# Patient Record
Sex: Female | Born: 1945 | Race: White | Hispanic: No | Marital: Married | Smoking: Never smoker
Health system: Southern US, Community
[De-identification: ages and names within clinical notes are randomized; demographics above are authoritative.]

## PROBLEM LIST (undated history)

## (undated) DIAGNOSIS — I10 Essential (primary) hypertension: Secondary | ICD-10-CM

## (undated) DIAGNOSIS — N63 Unspecified lump in unspecified breast: Secondary | ICD-10-CM

## (undated) DIAGNOSIS — C50919 Malignant neoplasm of unspecified site of unspecified female breast: Secondary | ICD-10-CM

## (undated) DIAGNOSIS — E119 Type 2 diabetes mellitus without complications: Secondary | ICD-10-CM

---

## 2005-05-17 ENCOUNTER — Ambulatory Visit: Payer: Self-pay | Admitting: Internal Medicine

## 2005-05-26 ENCOUNTER — Ambulatory Visit: Payer: Self-pay | Admitting: Internal Medicine

## 2005-07-15 ENCOUNTER — Ambulatory Visit: Payer: Self-pay | Admitting: General Surgery

## 2005-07-26 DIAGNOSIS — C50919 Malignant neoplasm of unspecified site of unspecified female breast: Secondary | ICD-10-CM

## 2005-07-26 HISTORY — PX: BREAST LUMPECTOMY: SHX2

## 2005-07-26 HISTORY — PX: BREAST BIOPSY: SHX20

## 2005-07-26 HISTORY — DX: Malignant neoplasm of unspecified site of unspecified female breast: C50.919

## 2005-08-26 ENCOUNTER — Ambulatory Visit: Payer: Self-pay | Admitting: General Surgery

## 2005-09-20 ENCOUNTER — Ambulatory Visit: Payer: Self-pay | Admitting: Internal Medicine

## 2005-09-27 ENCOUNTER — Ambulatory Visit: Payer: Self-pay | Admitting: Radiation Oncology

## 2005-10-24 ENCOUNTER — Ambulatory Visit: Payer: Self-pay | Admitting: Radiation Oncology

## 2005-11-05 ENCOUNTER — Encounter: Payer: Self-pay | Admitting: Nurse Practitioner

## 2005-11-23 ENCOUNTER — Encounter: Payer: Self-pay | Admitting: Nurse Practitioner

## 2005-11-23 ENCOUNTER — Ambulatory Visit: Payer: Self-pay | Admitting: Radiation Oncology

## 2005-12-24 ENCOUNTER — Ambulatory Visit: Payer: Self-pay | Admitting: Radiation Oncology

## 2005-12-24 ENCOUNTER — Encounter: Payer: Self-pay | Admitting: Nurse Practitioner

## 2006-01-23 ENCOUNTER — Ambulatory Visit: Payer: Self-pay | Admitting: Radiation Oncology

## 2006-04-15 ENCOUNTER — Ambulatory Visit: Payer: Self-pay | Admitting: Internal Medicine

## 2006-04-19 ENCOUNTER — Ambulatory Visit: Payer: Self-pay | Admitting: Internal Medicine

## 2006-04-25 ENCOUNTER — Ambulatory Visit: Payer: Self-pay | Admitting: Radiation Oncology

## 2006-08-16 ENCOUNTER — Ambulatory Visit: Payer: Self-pay | Admitting: Internal Medicine

## 2006-08-26 ENCOUNTER — Ambulatory Visit: Payer: Self-pay | Admitting: Internal Medicine

## 2006-11-24 ENCOUNTER — Ambulatory Visit: Payer: Self-pay | Admitting: Internal Medicine

## 2006-12-25 ENCOUNTER — Ambulatory Visit: Payer: Self-pay | Admitting: Internal Medicine

## 2006-12-28 ENCOUNTER — Ambulatory Visit: Payer: Self-pay | Admitting: Internal Medicine

## 2007-01-24 ENCOUNTER — Ambulatory Visit: Payer: Self-pay | Admitting: Internal Medicine

## 2007-04-26 ENCOUNTER — Ambulatory Visit: Payer: Self-pay | Admitting: Internal Medicine

## 2007-05-17 ENCOUNTER — Ambulatory Visit: Payer: Self-pay | Admitting: Internal Medicine

## 2007-05-27 ENCOUNTER — Ambulatory Visit: Payer: Self-pay | Admitting: Internal Medicine

## 2007-05-31 ENCOUNTER — Ambulatory Visit: Payer: Self-pay | Admitting: Internal Medicine

## 2007-08-27 ENCOUNTER — Ambulatory Visit: Payer: Self-pay | Admitting: Internal Medicine

## 2007-09-24 ENCOUNTER — Ambulatory Visit: Payer: Self-pay | Admitting: Internal Medicine

## 2007-10-02 ENCOUNTER — Ambulatory Visit: Payer: Self-pay | Admitting: Internal Medicine

## 2007-10-25 ENCOUNTER — Ambulatory Visit: Payer: Self-pay | Admitting: Internal Medicine

## 2008-01-24 ENCOUNTER — Ambulatory Visit: Payer: Self-pay | Admitting: Internal Medicine

## 2008-12-23 ENCOUNTER — Ambulatory Visit: Payer: Self-pay | Admitting: Internal Medicine

## 2008-12-26 ENCOUNTER — Ambulatory Visit: Payer: Self-pay | Admitting: Internal Medicine

## 2009-01-23 ENCOUNTER — Ambulatory Visit: Payer: Self-pay | Admitting: Internal Medicine

## 2009-02-21 ENCOUNTER — Ambulatory Visit: Payer: Self-pay | Admitting: Internal Medicine

## 2009-02-23 ENCOUNTER — Ambulatory Visit: Payer: Self-pay | Admitting: Internal Medicine

## 2009-09-23 ENCOUNTER — Ambulatory Visit: Payer: Self-pay | Admitting: Internal Medicine

## 2009-10-24 ENCOUNTER — Ambulatory Visit: Payer: Self-pay | Admitting: Internal Medicine

## 2009-12-24 ENCOUNTER — Ambulatory Visit: Payer: Self-pay | Admitting: Internal Medicine

## 2010-03-26 ENCOUNTER — Ambulatory Visit: Payer: Self-pay | Admitting: Internal Medicine

## 2010-04-14 ENCOUNTER — Ambulatory Visit: Payer: Self-pay | Admitting: Internal Medicine

## 2010-04-25 ENCOUNTER — Ambulatory Visit: Payer: Self-pay | Admitting: Internal Medicine

## 2010-07-23 IMAGING — MG MM CAD DIAGNOSTIC MAMMO
1 series · 8 of 8 positions shown · non-contrast
Comparison: none

REASON FOR EXAM: Hx Breast CA  Yearly
COMMENTS:

[R CC · right · 8 of 9 slices shown]
[im 1/9]
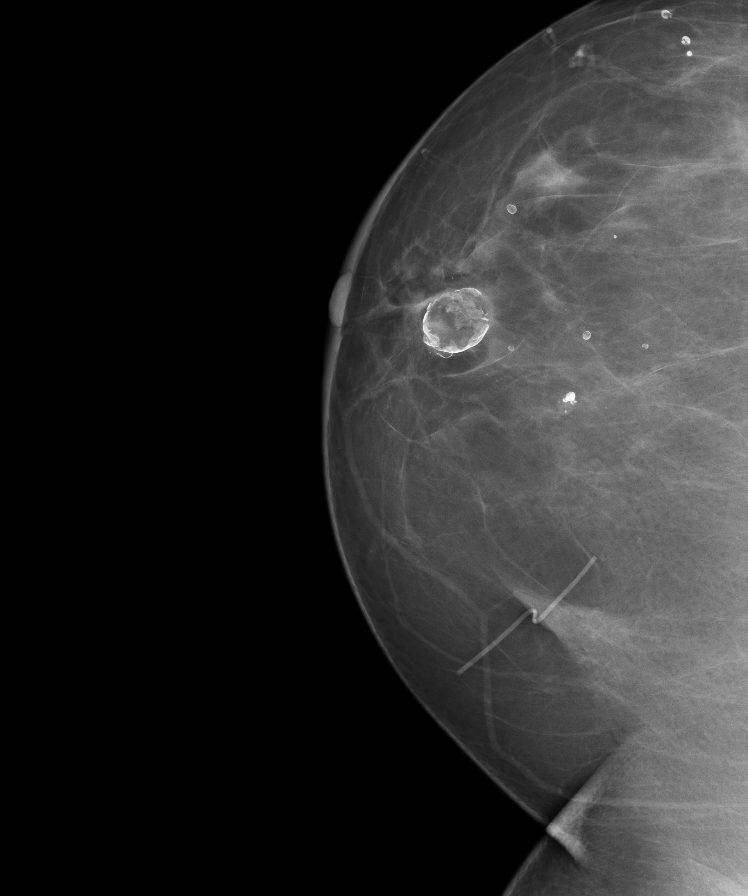
[im 2/9]
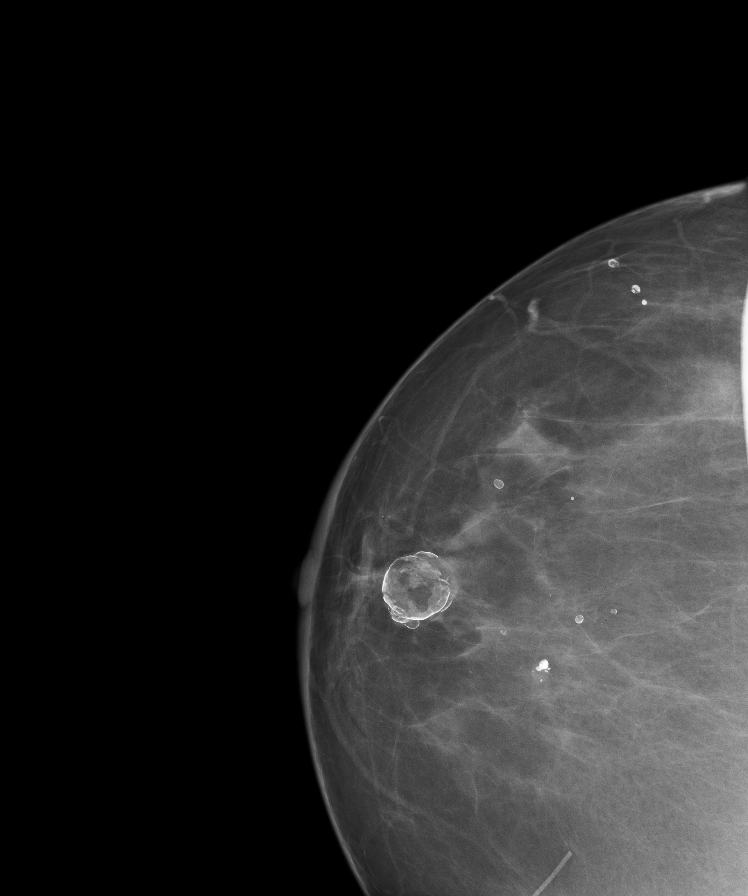
[im 3/9]
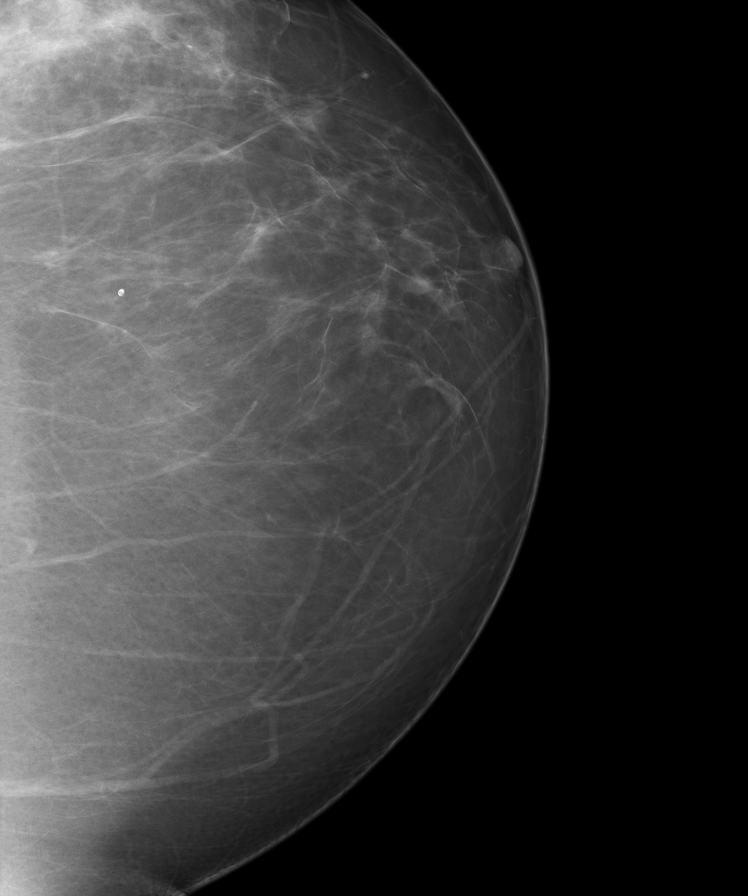
[im 4/9]
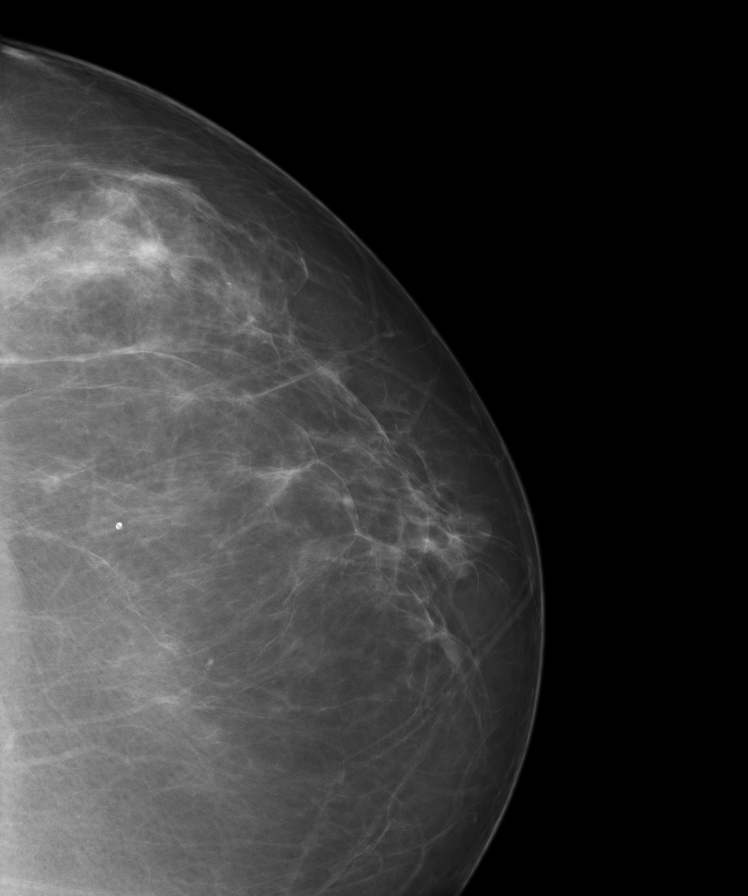
[im 5/9]
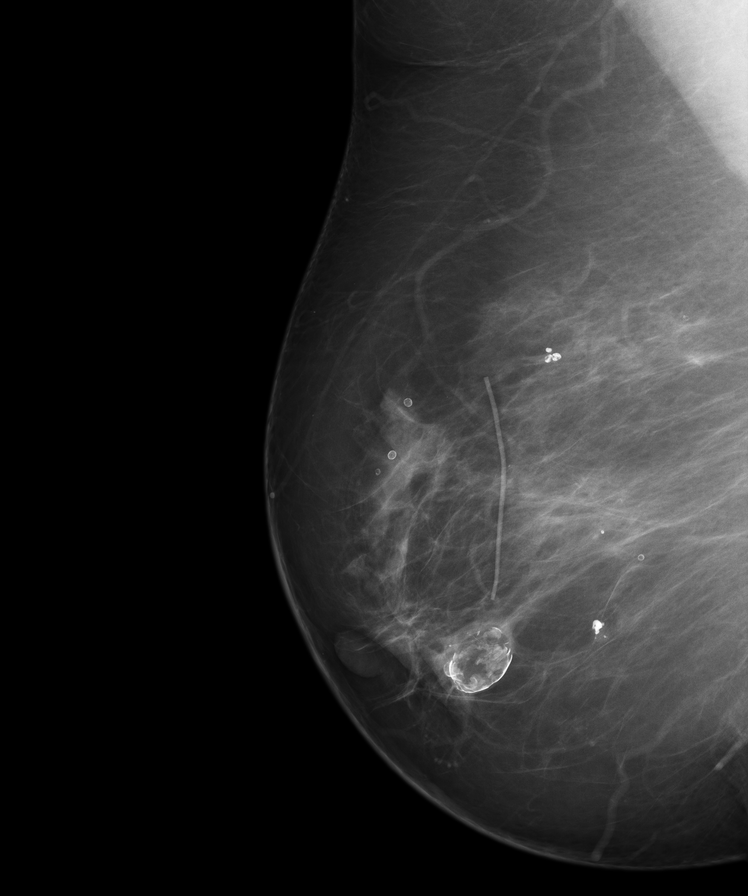
[im 6/9]
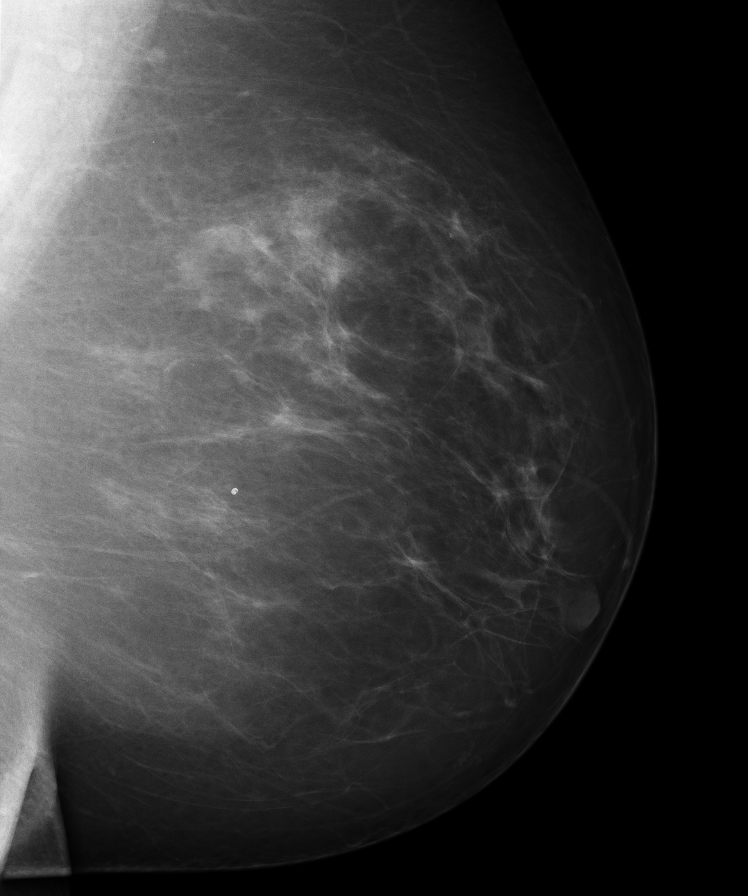
[im 7/9]
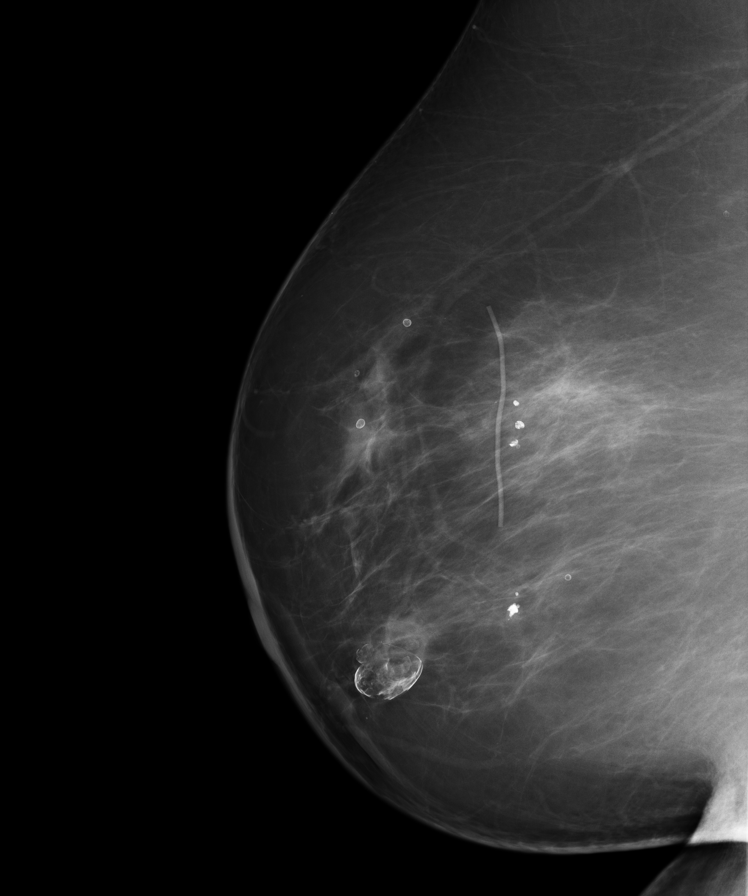
[im 9/9]
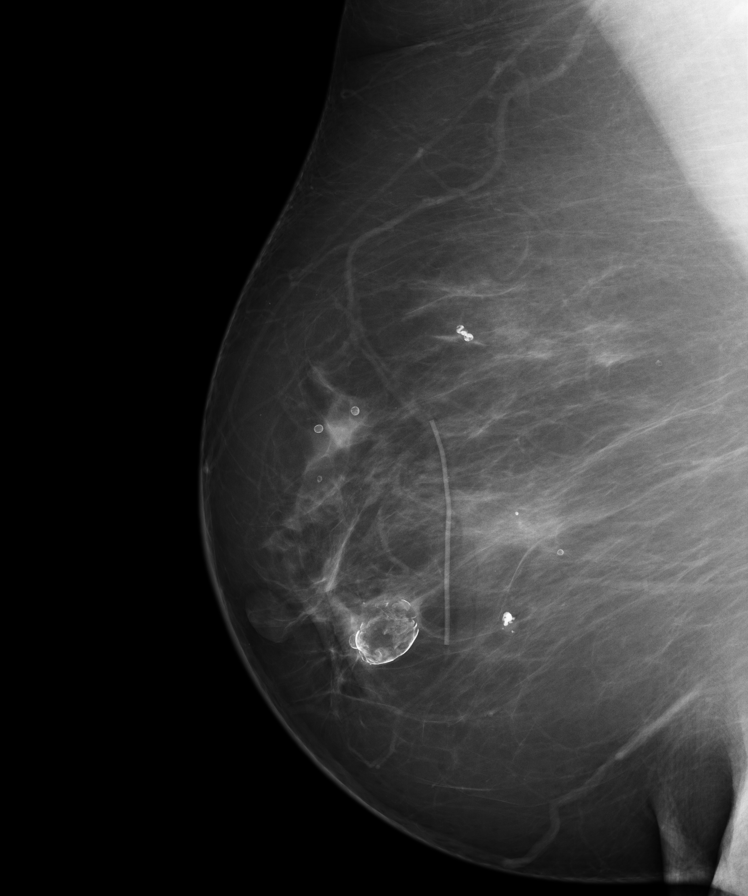

[8 of 8 positions shown; findings below may reference images not displayed]

PROCEDURE:     MAM - MAM DGTL DIAGNOSTIC MAMMO W/CAD  - December 23, 2008 [DATE]

RESULT:     Comparison is made to prior studies dated 04/15/2006, 05/31/2007
and 04/23/2002.

The breasts demonstrate a heterogeneous, moderately dense fibronodular
parenchymal pattern. An area of asymmetric, spiculated density projects in
the lateral portion of the left breast, appreciated primarily on the CC
view. Further evaluation with magnification compression imaging is
recommended. No further new radiographic evidence to suggest malignancy is
appreciated.
IMPRESSION: BI-RADS:  Category 0 - Needs Additional Imaging Evaluation

Thank you for this opportunity to contribute to the care of your patient.

A NEGATIVE MAMMOGRAM REPORT DOES NOT PRECLUDE BIOPSY OR OTHER EVALUATION OF
A CLINICALLY PALPABLE OR OTHERWISE SUSPICIOUS MASS OR LESION. BREAST CANCER
MAY NOT BE DETECTED BY MAMMOGRAPHY IN UP TO 10% OF CASES.

## 2011-03-16 ENCOUNTER — Ambulatory Visit: Payer: Self-pay | Admitting: Internal Medicine

## 2011-04-16 ENCOUNTER — Ambulatory Visit: Payer: Self-pay | Admitting: Internal Medicine

## 2011-04-26 ENCOUNTER — Ambulatory Visit: Payer: Self-pay | Admitting: Internal Medicine

## 2012-05-02 ENCOUNTER — Ambulatory Visit: Payer: Self-pay

## 2013-05-15 ENCOUNTER — Ambulatory Visit: Payer: Self-pay

## 2013-10-02 ENCOUNTER — Emergency Department: Payer: Self-pay | Admitting: Emergency Medicine

## 2013-10-02 LAB — BASIC METABOLIC PANEL
Anion Gap: 5 — ABNORMAL LOW (ref 7–16)
BUN: 14 mg/dL (ref 7–18)
CO2: 29 mmol/L (ref 21–32)
CREATININE: 0.9 mg/dL (ref 0.60–1.30)
Calcium, Total: 9.2 mg/dL (ref 8.5–10.1)
Chloride: 101 mmol/L (ref 98–107)
EGFR (African American): >60
EGFR (Non-African Amer.): >60
GLUCOSE: 183 mg/dL — AB (ref 65–99)
Osmolality: 275 (ref 275–301)
Potassium: 3.5 mmol/L (ref 3.5–5.1)
Sodium: 135 mmol/L — ABNORMAL LOW (ref 136–145)

## 2013-10-02 LAB — CBC
HCT: 44 % (ref 35.0–47.0)
HGB: 14.9 g/dL (ref 12.0–16.0)
MCH: 30 pg (ref 26.0–34.0)
MCHC: 33.9 g/dL (ref 32.0–36.0)
MCV: 88 fL (ref 80–100)
Platelet: 316 10*3/uL (ref 150–440)
RBC: 4.97 10*6/uL (ref 3.80–5.20)
RDW: 13.7 % (ref 11.5–14.5)
WBC: 11.1 10*3/uL — AB (ref 3.6–11.0)

## 2013-10-02 LAB — TROPONIN I

## 2014-11-21 ENCOUNTER — Ambulatory Visit
Admit: 2014-11-21 | Disposition: A | Discharge status: Home / Self Care | Payer: Self-pay | Admitting: Infectious Diseases

## 2016-04-07 ENCOUNTER — Other Ambulatory Visit: Payer: Self-pay | Admitting: Infectious Diseases

## 2016-04-07 DIAGNOSIS — Z1231 Encounter for screening mammogram for malignant neoplasm of breast: Secondary | ICD-10-CM

## 2016-04-28 ENCOUNTER — Ambulatory Visit
Admission: RE | Admit: 2016-04-28 | Discharge: 2016-04-28 | Disposition: A | Discharge status: Home / Self Care | Payer: Medicare Other | Source: Ambulatory Visit | Attending: Infectious Diseases | Admitting: Infectious Diseases

## 2016-04-28 DIAGNOSIS — Z1231 Encounter for screening mammogram for malignant neoplasm of breast: Secondary | ICD-10-CM | POA: Diagnosis present

## 2016-04-28 HISTORY — DX: Malignant neoplasm of unspecified site of unspecified female breast: C50.919

## 2016-10-15 ENCOUNTER — Inpatient Hospital Stay
Admission: EM | Admit: 2016-10-15 | Discharge: 2016-10-19 | DRG: 628 | Disposition: A | Discharge status: Home Health | Payer: Medicare Other | Attending: Internal Medicine | Admitting: Internal Medicine

## 2016-10-15 ENCOUNTER — Encounter: Payer: Self-pay | Admitting: Emergency Medicine

## 2016-10-15 ENCOUNTER — Emergency Department: Payer: Medicare Other

## 2016-10-15 DIAGNOSIS — I10 Essential (primary) hypertension: Secondary | ICD-10-CM | POA: Diagnosis present

## 2016-10-15 DIAGNOSIS — Z7984 Long term (current) use of oral hypoglycemic drugs: Secondary | ICD-10-CM | POA: Diagnosis not present

## 2016-10-15 DIAGNOSIS — Z888 Allergy status to other drugs, medicaments and biological substances status: Secondary | ICD-10-CM

## 2016-10-15 DIAGNOSIS — Z880 Allergy status to penicillin: Secondary | ICD-10-CM

## 2016-10-15 DIAGNOSIS — E876 Hypokalemia: Secondary | ICD-10-CM | POA: Diagnosis present

## 2016-10-15 DIAGNOSIS — L97529 Non-pressure chronic ulcer of other part of left foot with unspecified severity: Secondary | ICD-10-CM | POA: Diagnosis present

## 2016-10-15 DIAGNOSIS — E11621 Type 2 diabetes mellitus with foot ulcer: Secondary | ICD-10-CM | POA: Diagnosis present

## 2016-10-15 DIAGNOSIS — E1152 Type 2 diabetes mellitus with diabetic peripheral angiopathy with gangrene: Secondary | ICD-10-CM | POA: Diagnosis present

## 2016-10-15 DIAGNOSIS — M868X7 Other osteomyelitis, ankle and foot: Secondary | ICD-10-CM | POA: Diagnosis present

## 2016-10-15 DIAGNOSIS — Z66 Do not resuscitate: Secondary | ICD-10-CM | POA: Diagnosis present

## 2016-10-15 DIAGNOSIS — E11628 Type 2 diabetes mellitus with other skin complications: Secondary | ICD-10-CM | POA: Diagnosis present

## 2016-10-15 DIAGNOSIS — Z79899 Other long term (current) drug therapy: Secondary | ICD-10-CM

## 2016-10-15 DIAGNOSIS — Z853 Personal history of malignant neoplasm of breast: Secondary | ICD-10-CM | POA: Diagnosis not present

## 2016-10-15 DIAGNOSIS — I959 Hypotension, unspecified: Secondary | ICD-10-CM | POA: Diagnosis present

## 2016-10-15 DIAGNOSIS — L03116 Cellulitis of left lower limb: Secondary | ICD-10-CM | POA: Diagnosis present

## 2016-10-15 DIAGNOSIS — A480 Gas gangrene: Secondary | ICD-10-CM | POA: Diagnosis present

## 2016-10-15 DIAGNOSIS — E872 Acidosis: Secondary | ICD-10-CM | POA: Diagnosis present

## 2016-10-15 DIAGNOSIS — L02612 Cutaneous abscess of left foot: Secondary | ICD-10-CM | POA: Diagnosis present

## 2016-10-15 DIAGNOSIS — L0390 Cellulitis, unspecified: Secondary | ICD-10-CM | POA: Diagnosis present

## 2016-10-15 HISTORY — DX: Type 2 diabetes mellitus without complications: E11.9

## 2016-10-15 HISTORY — DX: Essential (primary) hypertension: I10

## 2016-10-15 LAB — CBC WITH DIFFERENTIAL/PLATELET
Basophils Absolute: 0.1 10*3/uL (ref 0–0.1)
Basophils Relative: 1 %
Eosinophils Absolute: 0.1 10*3/uL (ref 0–0.7)
Eosinophils Relative: 0 %
HEMATOCRIT: 40.9 % (ref 35.0–47.0)
HEMOGLOBIN: 13.6 g/dL (ref 12.0–16.0)
LYMPHS ABS: 2.7 10*3/uL (ref 1.0–3.6)
Lymphocytes Relative: 12 %
MCH: 28.7 pg (ref 26.0–34.0)
MCHC: 33.3 g/dL (ref 32.0–36.0)
MCV: 86.3 fL (ref 80.0–100.0)
MONO ABS: 1.9 10*3/uL — AB (ref 0.2–0.9)
MONOS PCT: 9 %
NEUTROS ABS: 17.1 10*3/uL — AB (ref 1.4–6.5)
NEUTROS PCT: 78 %
Platelets: 457 10*3/uL — ABNORMAL HIGH (ref 150–440)
RBC: 4.74 MIL/uL (ref 3.80–5.20)
RDW: 13.5 % (ref 11.5–14.5)
WBC: 21.9 10*3/uL — ABNORMAL HIGH (ref 3.6–11.0)

## 2016-10-15 LAB — BASIC METABOLIC PANEL
Anion gap: 10 (ref 5–15)
BUN: 18 mg/dL (ref 6–20)
CHLORIDE: 99 mmol/L — AB (ref 101–111)
CO2: 25 mmol/L (ref 22–32)
CREATININE: 0.94 mg/dL (ref 0.44–1.00)
Calcium: 9.2 mg/dL (ref 8.9–10.3)
GFR calc non Af Amer: 59 mL/min — ABNORMAL LOW (ref >60)
Glucose, Bld: 133 mg/dL — ABNORMAL HIGH (ref 65–99)
Potassium: 3.4 mmol/L — ABNORMAL LOW (ref 3.5–5.1)
Sodium: 134 mmol/L — ABNORMAL LOW (ref 135–145)

## 2016-10-15 LAB — GLUCOSE, CAPILLARY: GLUCOSE-CAPILLARY: 298 mg/dL — AB (ref 65–99)

## 2016-10-15 LAB — SEDIMENTATION RATE: Sed Rate: 43 mm/hr — ABNORMAL HIGH (ref 0–30)

## 2016-10-15 LAB — LACTIC ACID, PLASMA
LACTIC ACID, VENOUS: 3.2 mmol/L — AB (ref 0.5–1.9)
Lactic Acid, Venous: 3 mmol/L (ref 0.5–1.9)

## 2016-10-15 MED ORDER — SODIUM CHLORIDE 0.9 % IV SOLN
INTRAVENOUS | Status: DC
  Administered 2016-10-16 (×2): via INTRAVENOUS

## 2016-10-15 MED ORDER — SODIUM CHLORIDE 0.9 % IV SOLN
INTRAVENOUS | Status: DC
  Administered 2016-10-15: 22:00:00 via INTRAVENOUS

## 2016-10-15 MED ORDER — DEXTROSE 5 % IV SOLN: 1.0000 g | Freq: Once | INTRAVENOUS | Status: DC

## 2016-10-15 MED ORDER — SODIUM CHLORIDE 0.9 % IV BOLUS (SEPSIS)
1000.0000 mL | Freq: Once | INTRAVENOUS | Status: AC
  Administered 2016-10-15: 1000 mL via INTRAVENOUS

## 2016-10-15 MED ORDER — INSULIN ASPART 100 UNIT/ML ~~LOC~~ SOLN
0.0000 [IU] | Freq: Three times a day (TID) | SUBCUTANEOUS | Status: DC
  Administered 2016-10-16 – 2016-10-17 (×3): 2 [IU] via SUBCUTANEOUS
  Administered 2016-10-17: 1 [IU] via SUBCUTANEOUS
  Administered 2016-10-18: 2 [IU] via SUBCUTANEOUS
  Filled 2016-10-15: qty 1
  Filled 2016-10-15 (×4): qty 2

## 2016-10-15 MED ORDER — SODIUM CHLORIDE 0.9 % IV SOLN: 1.0000 g | Freq: Three times a day (TID) | INTRAVENOUS | Status: DC

## 2016-10-15 MED ORDER — VANCOMYCIN HCL IN DEXTROSE 1-5 GM/200ML-% IV SOLN
1000.0000 mg | Freq: Once | INTRAVENOUS | Status: AC
  Administered 2016-10-15: 1000 mg via INTRAVENOUS
  Filled 2016-10-15: qty 200

## 2016-10-15 MED ORDER — CEFTRIAXONE SODIUM-DEXTROSE 1-3.74 GM-% IV SOLR
1.0000 g | Freq: Once | INTRAVENOUS | Status: AC
  Administered 2016-10-15: 1 g via INTRAVENOUS
  Filled 2016-10-15: qty 50

## 2016-10-15 MED ORDER — HYDROCHLOROTHIAZIDE 25 MG PO TABS: 12.5000 mg | ORAL_TABLET | Freq: Every evening | ORAL | Status: DC

## 2016-10-15 MED ORDER — INSULIN ASPART 100 UNIT/ML ~~LOC~~ SOLN
0.0000 [IU] | Freq: Every day | SUBCUTANEOUS | Status: DC
  Administered 2016-10-15: 3 [IU] via SUBCUTANEOUS
  Filled 2016-10-15: qty 3

## 2016-10-15 MED ORDER — SODIUM CHLORIDE 0.9 % IV BOLUS (SEPSIS): 1000.0000 mL | Freq: Once | INTRAVENOUS | Status: DC

## 2016-10-15 MED ORDER — ACETAMINOPHEN 325 MG PO TABS: 650.0000 mg | ORAL_TABLET | Freq: Four times a day (QID) | ORAL | Status: DC | PRN

## 2016-10-15 MED ORDER — OXYCODONE HCL 5 MG PO TABS: 5.0000 mg | ORAL_TABLET | ORAL | Status: DC | PRN

## 2016-10-15 MED ORDER — ACETAMINOPHEN 650 MG RE SUPP: 650.0000 mg | Freq: Four times a day (QID) | RECTAL | Status: DC | PRN

## 2016-10-15 MED ORDER — VANCOMYCIN HCL IN DEXTROSE 1-5 GM/200ML-% IV SOLN
1000.0000 mg | INTRAVENOUS | Status: DC
  Filled 2016-10-15: qty 200

## 2016-10-15 MED ORDER — POTASSIUM CHLORIDE CRYS ER 20 MEQ PO TBCR
40.0000 meq | EXTENDED_RELEASE_TABLET | Freq: Once | ORAL | Status: AC
  Administered 2016-10-15: 40 meq via ORAL
  Filled 2016-10-15: qty 2

## 2016-10-15 MED ORDER — LISINOPRIL 10 MG PO TABS
10.0000 mg | ORAL_TABLET | Freq: Every day | ORAL | Status: DC
Start: 2016-10-16 — End: 2016-10-19
  Administered 2016-10-17 – 2016-10-19 (×3): 10 mg via ORAL
  Filled 2016-10-15 (×3): qty 1

## 2016-10-15 MED ORDER — MEROPENEM-SODIUM CHLORIDE 1 GM/50ML IV SOLR
1.0000 g | Freq: Three times a day (TID) | INTRAVENOUS | Status: DC
  Administered 2016-10-15 – 2016-10-18 (×8): 1 g via INTRAVENOUS
  Filled 2016-10-15 (×10): qty 50

## 2016-10-15 MED ORDER — VANCOMYCIN HCL IN DEXTROSE 1-5 GM/200ML-% IV SOLN
1000.0000 mg | INTRAVENOUS | Status: DC
  Administered 2016-10-16 – 2016-10-18 (×4): 1000 mg via INTRAVENOUS
  Filled 2016-10-15 (×4): qty 200

## 2016-10-15 NOTE — ED Notes (Signed)
Unsuccessful [place] IV start LAC, unable to float past valve.

## 2016-10-15 NOTE — ED Provider Notes (Signed)
I spoke with Dr. Elvina Mattes, podiatry given gas in tissue.  He recommends npo after midnight.  Hospitalist admission and he will plan for surgical debridement in the morning.  I updated hospitalist Dr. Earleen Newport as well.   Lisa Roca, MD 10/15/16 2043

## 2016-10-15 NOTE — Progress Notes (Signed)
Pt. Lactic acid increased to 3.2. Dr. Marcille Blanco ordered for fluids to be increased to 75cc/hr

## 2016-10-15 NOTE — ED Notes (Signed)
Patient given food tray

## 2016-10-15 NOTE — ED Triage Notes (Signed)
Pt to ED c/o left foot pain. Pt has hx/o diabetes, pt has sore on the bottom of her left foot. Top of left foot is red swollen and warm to touch. Pt states that foot is painful. Pt in NAD

## 2016-10-15 NOTE — ED Provider Notes (Signed)
I reviewed the lactate elevated at 3. Patient placed on sepsis protocol. Additional liter normal saline started.  He was also noted in the nursing notes that blood pressure was documented at 96/61.  Patient admitted to hospitalist.   Lisa Roca, MD 10/15/16 2012

## 2016-10-15 NOTE — ED Provider Notes (Signed)
[facility] Emergency Department Provider Note ____________________________________________   I have reviewed the triage vital signs and the triage nursing note.  HISTORY  Chief Complaint Foot Pain   Historian Patient  HPI Joann [name] is a 71 y.o. female with a history of type 2 diabetes, presents with redness and swelling of the left foot for several days now. They've been putting peroxide on the wound on the plantar surface of her foot near her fifth metatarsal. No fevers. Over the last 24 hours significantly worse redness all way across the left foot and up into her calf. No significant pain in her foot.  Initially she thought that maybe she stepped on a piece of glass, but she actually doesn't remember that she was just trying to think of why her foot might have a hole in it.  No fever. No trouble breathing. No chest pain. No nausea or sweating or vomiting.   Past Medical History:  Diagnosis Date  . Breast cancer ([place]) 2007   right breast lumpectomy with 36 rad tx  . Diabetes mellitus without complication ([place])   . Hypertension     There are no active problems to display for this patient.   Past Surgical History:  Procedure Laterality Date  . BREAST BIOPSY Left 2000   benign  . BREAST BIOPSY Right 2007   positive.  Lumpectomy with rad tx    Prior to Admission medications   Medication Sig Start Date End Date Taking? Authorizing Provider  hydrochlorothiazide (HYDRODIURIL) 12.5 MG tablet Take 12.5 mg by mouth every evening. 10/12/16  Yes Historical Provider, MD  lisinopril (PRINIVIL,ZESTRIL) 10 MG tablet Take 10 mg by mouth daily. 07/30/16  Yes Historical Provider, MD  metFORMIN (GLUCOPHAGE) 500 MG tablet Take 500 mg by mouth 2 (two) times daily. 09/30/16  Yes Historical Provider, MD    Allergies  Allergen Reactions  . Neosporin [Neomycin-Bacitracin Zn-Polymyx]     "it just don't agree with me"   . Penicillins Itching    Has patient had a PCN  reaction causing immediate rash, facial/tongue/throat swelling, SOB or lightheadedness with hypotension: no Has patient had a PCN reaction causing severe rash involving mucus membranes or skin necrosis: no Has patient had a PCN reaction that required hospitalization no Has patient had a PCN reaction occurring within the last 10 years:no If all of the above answers are "NO", then may proceed with Cephalosporin use.   . Statins     hallucinations    No family history on file.  Social History Social History  Substance Use Topics  . Smoking status: Never Smoker  . Smokeless tobacco: Never Used  . Alcohol use No    Review of Systems  Constitutional: Negative for fever. Eyes: Negative for visual changes. ENT: Negative for sore throat. Cardiovascular: Negative for chest pain. Respiratory: Negative for shortness of breath. Gastrointestinal: Negative for abdominal pain, vomiting and diarrhea. Genitourinary: Negative for dysuria. Musculoskeletal: Negative for back pain. Skin: Left foot redness as per history of present illness. Neurological: Negative for headache. 10 point Review of Systems otherwise negative ____________________________________________   PHYSICAL EXAM:  VITAL SIGNS: ED Triage Vitals [10/15/16 1721]  Enc Vitals Group     BP (!) 157/86     Pulse Rate 75     Resp 16     Temp 98.3 F (36.8 C)     Temp Source Oral     SpO2 98 %     Weight 204 lb (92.5 kg)  Height      Head Circumference      Peak Flow      Pain Score 3     Pain Loc      Pain Edu?      Excl. in [place]?      Constitutional: Alert and oriented. Well appearing and in no distress. HEENT   Head: Normocephalic and atraumatic.      Eyes: Conjunctivae are normal. PERRL. Normal extraocular movements.      Ears:         Nose: No congestion/rhinnorhea.   Mouth/Throat: Mucous membranes are moist.   Neck: No stridor. Cardiovascular/Chest: Normal rate, regular rhythm.  No murmurs, rubs,  or gallops. Respiratory: Normal respiratory effort without tachypnea nor retractions. Breath sounds are clear and equal bilaterally. No wheezes/rales/rhonchi. Gastrointestinal: Soft. No distention, no guarding, no rebound. Nontender.    Genitourinary/rectal:Deferred Musculoskeletal: Deep ulceration plantar surface near the fifth metatarsal on the left. Significant swelling and redness across the toes and the top of the foot extending into the calf on the left. Neurovascularly intact. There is some peripheral discoloration over the MTP joint area on the left. Neurologic:  Normal speech and language. No gross or focal neurologic deficits are appreciated. Skin:  No new drainage at the foot ulcer, but significant erythema as per above on the left foot. Psychiatric: Mood and affect are normal. Speech and behavior are normal. Patient exhibits appropriate insight and judgment.   ____________________________________________  LABS (pertinent positives/negatives)  Labs Reviewed  CBC WITH DIFFERENTIAL/PLATELET - Abnormal; Notable for the following:       Result Value   WBC 21.9 (*)    Platelets 457 (*)    Neutro Abs 17.1 (*)    Monocytes Absolute 1.9 (*)    All other components within normal limits  BASIC METABOLIC PANEL - Abnormal; Notable for the following:    Sodium 134 (*)    Potassium 3.4 (*)    Chloride 99 (*)    Glucose, Bld 133 (*)    GFR calc non Af Amer 59 (*)    All other components within normal limits  CULTURE, BLOOD (ROUTINE X 2)  CULTURE, BLOOD (ROUTINE X 2)  LACTIC ACID, PLASMA  LACTIC ACID, PLASMA    ____________________________________________    EKG I, Lisa Roca, MD, the attending physician have personally viewed and interpreted all ECGs.  None ____________________________________________  RADIOLOGY All Xrays were viewed by me. Imaging interpreted by Radiologist.  Left foot x-ray:  IMPRESSION: Air in the soft tissues adjacent to, and surrounding, the fifth  MTP joint. No fracture or osseous abnormality. __________________________________________  PROCEDURES  Procedure(s) performed: None  Critical Care performed: None  ____________________________________________   ED COURSE / ASSESSMENT AND PLAN  Pertinent labs & imaging results that were available during my care of the patient were reviewed by me and considered in my medical decision making (see chart for details).   Ms. Griner has a significant cellulitis associated with what I believe is a diabetic foot ulcer as opposed to her initial concern about possibly having stepped on glass. It sounds like she does not actually remember stepping on glass nor does she have any specific pain there. The wound looks like a diabetic foot ulcer and now with a cellulitis extending up the leg.  She has not had a fever or other systemic symptoms, however her white blood cell count is significantly elevated with a bandemia.  I did send blood cultures and I'm going to put her on antibiotics  by IV and admitted to the hospital.  She reports a history to penicillin which was "it broke me out. "  Allergy something it was overall mild. I discussed with her treatment with Rocephin as well as vancomycin.  Blood cultures and lactate added prior to antibiotics.  Lactate pending upon hospitalist consultation.  No tachycardia or hypotension concerning for sepsis at this point.  X-ray shows no findings lasted myelitis. It was noted that there is air in the foot. No pain out of proportion or crepitus. I'm not concerned for necrotizing fasciitis at this point in time.  Patient to have podiatry consultation in the hospital.  Overnight, IV antibiotics.   CONSULTATIONS: Hospitalist for admission.   Patient / Family / Caregiver informed of clinical course, medical decision-making process, and agree with plan.   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 Any transcriptional errors that result from this process are unintentional    Lisa Roca, MD 10/15/16 1944

## 2016-10-15 NOTE — Progress Notes (Signed)
Pharmacy Antibiotic Note  Joann [name] is a 71 y.o. female admitted on 10/15/2016 with sepsis/likely osteomyelitis.  Pharmacy has been consulted for vancomycin and meropenem dosing.  Plan: DW 68kg  Vd 48L kei 0.053 hr-1  t1/2 13 hours Vancomycin 1 gram q 18 hours ordered with stacked dosing. Level before 5th dose. Goal trough 15-20.  Meropenem 1 gram q 8 hours ordered.  Height: 5\' 3"  (160 cm) Weight: 195 lb 1.6 oz (88.5 kg) IBW/kg (Calculated) : 52.4  Temp (24hrs), Avg:99.4 F (37.4 C), Min:98.3 F (36.8 C), Max:100.5 F (38.1 C)   Recent Labs Lab 10/15/16 1733 10/15/16 1908  WBC 21.9*  --   CREATININE 0.94  --   LATICACIDVEN  --  3.0*    Estimated Creatinine Clearance: 57 mL/min (by C-G formula based on SCr of 0.94 mg/dL).    Allergies  Allergen Reactions  . Neosporin [Neomycin-Bacitracin Zn-Polymyx]     "it just don't agree with me"   . Penicillins Itching    Has patient had a PCN reaction causing immediate rash, facial/tongue/throat swelling, SOB or lightheadedness with hypotension: no Has patient had a PCN reaction causing severe rash involving mucus membranes or skin necrosis: no Has patient had a PCN reaction that required hospitalization no Has patient had a PCN reaction occurring within the last 10 years:no If all of the above answers are "NO", then may proceed with Cephalosporin use.   . Statins     hallucinations    Antimicrobials this admission: Vancomycin, meropenem 3/23  >>    >>   Dose adjustments this admission:   Microbiology results: 3/23 BCx: pending   Thank you for allowing pharmacy to be a part of this patient's care.  Amirah Goerke S 10/15/2016 10:32 PM

## 2016-10-15 NOTE — H&P (Signed)
[facility] [place] NAME: Joann [name]    MR#:  [id number]  DATE OF BIRTH:  [birth year]  DATE OF ADMISSION:  10/15/2016  PRIMARY CARE PHYSICIAN: Leonel Ramsay, MD   REQUESTING/REFERRING PHYSICIAN: Dr Lisa Roca  CHIEF COMPLAINT:   Chief Complaint  Patient presents with  . Foot Pain    HISTORY OF PRESENT ILLNESS:  Joann [name]  is a 71 y.o. female with a known history Of diabetes and hypertension. She presents with foot pain. Yesterday she started developing pain in her left foot 3 out of 10 intensity. It became swollen and red this a.m. She thought she had a piece of glass in her foot. She does not recall how long the ulcer is been on the bottom of her foot. No complaints of fever. In the ER she was found to have an elevated white blood cell count and lactic acidosis. X-ray showing air in the soft tissues adjacent in the surrounding the fifth MTP joint. Hospitalist services were contacted for further evaluation.  PAST MEDICAL HISTORY:   Past Medical History:  Diagnosis Date  . Breast cancer ([place]) 2007   right breast lumpectomy with 36 rad tx  . Diabetes mellitus without complication ([place])   . Hypertension     PAST SURGICAL HISTORY:   Past Surgical History:  Procedure Laterality Date  . BREAST BIOPSY Left 2000   benign  . BREAST BIOPSY Right 2007   positive.  Lumpectomy with rad tx    SOCIAL HISTORY:   Social History  Substance Use Topics  . Smoking status: Never Smoker  . Smokeless tobacco: Never Used  . Alcohol use No    FAMILY HISTORY:   Family History  Problem Relation Age of Onset  . Dementia Mother   . CAD Mother   . CAD Father   . Deep vein thrombosis Father     DRUG ALLERGIES:   Allergies  Allergen Reactions  . Neosporin [Neomycin-Bacitracin Zn-Polymyx]     "it just don't agree with me"   . Penicillins Itching    Has patient had a PCN reaction causing immediate rash,  facial/tongue/throat swelling, SOB or lightheadedness with hypotension: no Has patient had a PCN reaction causing severe rash involving mucus membranes or skin necrosis: no Has patient had a PCN reaction that required hospitalization no Has patient had a PCN reaction occurring within the last 10 years:no If all of the above answers are "NO", then may proceed with Cephalosporin use.   . Statins     hallucinations    REVIEW OF SYSTEMS:  CONSTITUTIONAL: No fever, fatigue or weakness.  EYES: Occasional blurred vision EARS, NOSE, AND THROAT: No tinnitus or ear pain. No sore throat RESPIRATORY: No cough, shortness of breath, wheezing or hemoptysis.  CARDIOVASCULAR: No chest pain, orthopnea, edema.  GASTROINTESTINAL: No nausea, vomiting, diarrhea or abdominal pain. No blood in bowel movements GENITOURINARY: No dysuria, hematuria.  ENDOCRINE: No polyuria, nocturia,  HEMATOLOGY: No anemia, easy bruising or bleeding SKIN: No rash or lesion. MUSCULOSKELETAL: Left foot pain   NEUROLOGIC: No tingling, numbness, weakness.  PSYCHIATRY: No anxiety or depression.   MEDICATIONS AT HOME:   Prior to Admission medications   Medication Sig Start Date End Date Taking? Authorizing Provider  hydrochlorothiazide (HYDRODIURIL) 12.5 MG tablet Take 12.5 mg by mouth every evening. 10/12/16  Yes Historical Provider, MD  lisinopril (PRINIVIL,ZESTRIL) 10 MG tablet Take 10 mg by mouth daily. 07/30/16  Yes Historical Provider, MD  metFORMIN (GLUCOPHAGE)  500 MG tablet Take 500 mg by mouth 2 (two) times daily. 09/30/16  Yes Historical Provider, MD      VITAL SIGNS:  Blood pressure 96/61, pulse 81, temperature 98.3 F (36.8 C), temperature source Oral, resp. rate 17, weight 92.5 kg (204 lb), SpO2 98 %.  PHYSICAL EXAMINATION:  GENERAL:  71 y.o.-year-old patient lying in the bed with no acute distress.  EYES: Pupils equal, round, reactive to light and accommodation. No scleral icterus. Extraocular muscles intact.   HEENT: Head atraumatic, normocephalic. Oropharynx and nasopharynx clear.  NECK:  Supple, no jugular venous distention. No thyroid enlargement, no tenderness.  LUNGS: Normal breath sounds bilaterally, no wheezing, rales,rhonchi or crepitation. No use of accessory muscles of respiration.  CARDIOVASCULAR: S1, S2 normal. No murmurs, rubs, or gallops.  ABDOMEN: Soft, nontender, nondistended. Bowel sounds present. No organomegaly or mass.  EXTREMITIES: Trace pedal edema. No cyanosis, or clubbing.  NEUROLOGIC: Cranial nerves II through XII are intact. Muscle strength 5/5 in all extremities. Sensation intact. Gait not checked.  PSYCHIATRIC: The patient is alert and oriented x 3.  SKIN: Left foot erythema, worse over the fifth metatarsal joint. Bottom of the foot under the fifth metatarsal is a ulcer with a little pus coming out of it  LABORATORY PANEL:   CBC  Recent Labs Lab 10/15/16 1733  WBC 21.9*  HGB 13.6  HCT 40.9  PLT 457*   ------------------------------------------------------------------------------------------------------------------  Chemistries   Recent Labs Lab 10/15/16 1733  NA 134*  K 3.4*  CL 99*  CO2 25  GLUCOSE 133*  BUN 18  CREATININE 0.94  CALCIUM 9.2   ------------------------------------------------------------------------------------------------------------------   RADIOLOGY:  Dg Foot Complete Left  Result Date: 10/15/2016 CLINICAL DATA:  LEFT foot pain with redness.  History of diabetes. EXAM: LEFT FOOT - COMPLETE 3+ VIEW COMPARISON:  None. FINDINGS: There is air in the soft tissues lateral to the fifth MTP joint, and surrounding the MTP joint. No radiopaque foreign body is seen. There is marked soft tissue swelling. No osseous erosion is seen. Osteopenia is noted. IMPRESSION: Air in the soft tissues adjacent to, and surrounding, the fifth MTP joint. No fracture or osseous abnormality. Electronically Signed   By: Staci Righter M.D.   On: 10/15/2016 18:49     EKG:   Sinus tachycardia, LVH  IMPRESSION AND PLAN:   1. Clinical sepsis. 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 Patient also has lactic acidosis. Vancomycin and meropenem ordered. ER physician spoke with Dr. Elvina Mattes podiatry to see the patient in the morning for potential operation. Patient high risk to lose her fifth toe. We'll keep nothing by mouth after midnight. No contraindications to surgery at this time. Add on a sedimentation rate and a C-reactive protein. 2. Lactic acidosis. Glucophage contraindicated at this point. 3. Relative hypotension. Hold antihypertensive medications 4. Type 2 diabetes mellitus. Last hemoglobin A1c was 6.8. Put on sliding scale at this point. Add on another hemoglobin A1c. 5. Hypokalemia replace potassium   All the records are reviewed and case discussed with ED provider. Management plans discussed with the patient, family and they are in agreement.  CODE STATUS: DO NOT RESUSCITATE  TOTAL TIME TAKING CARE OF THIS PATIENT: 50 minutes.    Loletha Grayer M.D on 10/15/2016 at 9:10 PM  Between 7am to 6pm - Pager - [phone]  After 6pm call admission pager [phone]  [facility]  [phone]  CC: Primary care physician; Leonel Ramsay, MD

## 2016-10-16 ENCOUNTER — Encounter
Admission: EM | Disposition: A | Discharge status: Home Health | Payer: Self-pay | Source: Home / Self Care | Attending: Internal Medicine

## 2016-10-16 ENCOUNTER — Inpatient Hospital Stay: Payer: Medicare Other | Admitting: Anesthesiology

## 2016-10-16 HISTORY — PX: INCISION AND DRAINAGE: SHX5863

## 2016-10-16 LAB — CBC
HEMATOCRIT: 37.2 % (ref 35.0–47.0)
HEMOGLOBIN: 12.4 g/dL (ref 12.0–16.0)
MCH: 29.2 pg (ref 26.0–34.0)
MCHC: 33.4 g/dL (ref 32.0–36.0)
MCV: 87.3 fL (ref 80.0–100.0)
Platelets: 340 10*3/uL (ref 150–440)
RBC: 4.26 MIL/uL (ref 3.80–5.20)
RDW: 13.3 % (ref 11.5–14.5)
WBC: 17.9 10*3/uL — AB (ref 3.6–11.0)

## 2016-10-16 LAB — BASIC METABOLIC PANEL
ANION GAP: 8 (ref 5–15)
BUN: 16 mg/dL (ref 6–20)
CO2: 25 mmol/L (ref 22–32)
Calcium: 8.4 mg/dL — ABNORMAL LOW (ref 8.9–10.3)
Chloride: 101 mmol/L (ref 101–111)
Creatinine, Ser: 0.8 mg/dL (ref 0.44–1.00)
Glucose, Bld: 149 mg/dL — ABNORMAL HIGH (ref 65–99)
POTASSIUM: 3.6 mmol/L (ref 3.5–5.1)
SODIUM: 134 mmol/L — AB (ref 135–145)

## 2016-10-16 LAB — GLUCOSE, CAPILLARY
GLUCOSE-CAPILLARY: 118 mg/dL — AB (ref 65–99)
Glucose-Capillary: 169 mg/dL — ABNORMAL HIGH (ref 65–99)
Glucose-Capillary: 127 mg/dL — ABNORMAL HIGH (ref 65–99)
Glucose-Capillary: 196 mg/dL — ABNORMAL HIGH (ref 65–99)

## 2016-10-16 LAB — C-REACTIVE PROTEIN: CRP: 11.9 mg/dL — ABNORMAL HIGH (ref <1.0)

## 2016-10-16 SURGERY — INCISION AND DRAINAGE
Anesthesia: General | Laterality: Left

## 2016-10-16 MED ORDER — FENTANYL CITRATE (PF) 100 MCG/2ML IJ SOLN
INTRAMUSCULAR | Status: AC
  Filled 2016-10-16: qty 2

## 2016-10-16 MED ORDER — MIDAZOLAM HCL 2 MG/2ML IJ SOLN
INTRAMUSCULAR | Status: AC
  Filled 2016-10-16: qty 2

## 2016-10-16 MED ORDER — OXYCODONE HCL 5 MG PO TABS: 10.0000 mg | ORAL_TABLET | ORAL | Status: DC | PRN

## 2016-10-16 MED ORDER — BUPIVACAINE HCL (PF) 0.5 % IJ SOLN
INTRAMUSCULAR | Status: AC
  Filled 2016-10-16: qty 30

## 2016-10-16 MED ORDER — LIDOCAINE HCL (CARDIAC) 20 MG/ML IV SOLN
INTRAVENOUS | Status: DC | PRN
  Administered 2016-10-16: 100 mg via INTRAVENOUS

## 2016-10-16 MED ORDER — LIDOCAINE HCL 1 % IJ SOLN
INTRAMUSCULAR | Status: DC | PRN
  Administered 2016-10-16: 4 mL

## 2016-10-16 MED ORDER — LIDOCAINE HCL (PF) 1 % IJ SOLN
INTRAMUSCULAR | Status: AC
  Filled 2016-10-16: qty 30

## 2016-10-16 MED ORDER — ONDANSETRON HCL 4 MG/2ML IJ SOLN
INTRAMUSCULAR | Status: AC
  Filled 2016-10-16: qty 2

## 2016-10-16 MED ORDER — PROPOFOL 10 MG/ML IV BOLUS
INTRAVENOUS | Status: AC
  Filled 2016-10-16: qty 20

## 2016-10-16 MED ORDER — LIDOCAINE HCL 1 % IJ SOLN
INTRAMUSCULAR | Status: DC | PRN
  Administered 2016-10-16: 14 mL

## 2016-10-16 MED ORDER — PHENYLEPHRINE HCL 10 MG/ML IJ SOLN
INTRAMUSCULAR | Status: DC | PRN
  Administered 2016-10-16 (×3): 100 ug via INTRAVENOUS
  Administered 2016-10-16: 200 ug via INTRAVENOUS
  Administered 2016-10-16: 100 ug via INTRAVENOUS
  Administered 2016-10-16: 200 ug via INTRAVENOUS

## 2016-10-16 MED ORDER — ONDANSETRON HCL 4 MG/2ML IJ SOLN: 4.0000 mg | Freq: Once | INTRAMUSCULAR | Status: DC | PRN

## 2016-10-16 MED ORDER — FENTANYL CITRATE (PF) 100 MCG/2ML IJ SOLN
INTRAMUSCULAR | Status: DC | PRN
  Administered 2016-10-16 (×2): 50 ug via INTRAVENOUS

## 2016-10-16 MED ORDER — FENTANYL CITRATE (PF) 100 MCG/2ML IJ SOLN: 25.0000 ug | INTRAMUSCULAR | Status: DC | PRN

## 2016-10-16 MED ORDER — MIDAZOLAM HCL 2 MG/2ML IJ SOLN
INTRAMUSCULAR | Status: DC | PRN
  Administered 2016-10-16: 2 mg via INTRAVENOUS

## 2016-10-16 MED ORDER — PROPOFOL 10 MG/ML IV BOLUS
INTRAVENOUS | Status: DC | PRN
  Administered 2016-10-16: 130 mg via INTRAVENOUS

## 2016-10-16 SURGICAL SUPPLY — 45 items
BAG COUNTER SPONGE EZ (MISCELLANEOUS) IMPLANT
BANDAGE ELASTIC 3 LF NS (GAUZE/BANDAGES/DRESSINGS) IMPLANT
BANDAGE ELASTIC 4 LF NS (GAUZE/BANDAGES/DRESSINGS) ×3 IMPLANT
BANDAGE STRETCH 3X4.1 STRL (GAUZE/BANDAGES/DRESSINGS) ×3 IMPLANT
BLADE OSCILLATING/SAGITTAL (BLADE) ×2
BLADE SW THK.38XMED LNG THN (BLADE) ×1 IMPLANT
BNDG ESMARK 4X12 TAN STRL LF (GAUZE/BANDAGES/DRESSINGS) ×3 IMPLANT
BNDG GAUZE 4.5X4.1 6PLY STRL (MISCELLANEOUS) ×3 IMPLANT
CANISTER SUCT 1200ML W/VALVE (MISCELLANEOUS) IMPLANT
COUNTER SPONGE BAG EZ (MISCELLANEOUS)
CUFF TOURN 18 STER (MISCELLANEOUS) ×3 IMPLANT
CUFF TOURN DUAL PL 12 NO SLV (MISCELLANEOUS) IMPLANT
DRAPE FLUOR MINI C-ARM 54X84 (DRAPES) ×3 IMPLANT
DURAPREP 26ML APPLICATOR (WOUND CARE) ×3 IMPLANT
ELECT REM PT RETURN 9FT ADLT (ELECTROSURGICAL) ×3
ELECTRODE REM PT RTRN 9FT ADLT (ELECTROSURGICAL) ×1 IMPLANT
GAUZE PETRO XEROFOAM 1X8 (MISCELLANEOUS) ×3 IMPLANT
GAUZE SPONGE 4X4 12PLY STRL (GAUZE/BANDAGES/DRESSINGS) ×3 IMPLANT
GAUZE XEROFORM 4X4 STRL (GAUZE/BANDAGES/DRESSINGS) ×3 IMPLANT
GLOVE BIO SURGEON STRL SZ8 (GLOVE) ×9 IMPLANT
GLOVE INDICATOR 8.0 STRL GRN (GLOVE) ×3 IMPLANT
GOWN STRL REUS W/ TWL LRG LVL3 (GOWN DISPOSABLE) ×2 IMPLANT
GOWN STRL REUS W/TWL LRG LVL3 (GOWN DISPOSABLE) ×4
HANDPIECE INTERPULSE COAX TIP (DISPOSABLE) ×2
HANDPIECE VERSAJET DEBRIDEMENT (MISCELLANEOUS) ×3 IMPLANT
IV SOD CHL 0.9% 1000ML (IV SOLUTION) ×6 IMPLANT
KIT RM TURNOVER STRD PROC AR (KITS) ×3 IMPLANT
LABEL OR SOLS (LABEL) ×3 IMPLANT
NEEDLE FILTER BLUNT 18X 1/2SAF (NEEDLE) ×2
NEEDLE FILTER BLUNT 18X1 1/2 (NEEDLE) ×1 IMPLANT
NEEDLE HYPO 25X1 1.5 SAFETY (NEEDLE) ×6 IMPLANT
NS IRRIG 500ML POUR BTL (IV SOLUTION) ×3 IMPLANT
PACK EXTREMITY ARMC (MISCELLANEOUS) ×3 IMPLANT
SET HNDPC FAN SPRY TIP SCT (DISPOSABLE) ×1 IMPLANT
STOCKINETTE STRL 6IN 960660 (GAUZE/BANDAGES/DRESSINGS) ×3 IMPLANT
SUT ETHILON 3-0 FS-10 30 BLK (SUTURE) ×3
SUT ETHILON 4-0 (SUTURE)
SUT ETHILON 4-0 FS2 18XMFL BLK (SUTURE)
SUT VIC AB 3-0 SH 27 (SUTURE)
SUT VIC AB 3-0 SH 27X BRD (SUTURE) IMPLANT
SUT VIC AB 4-0 FS2 27 (SUTURE) IMPLANT
SUTURE EHLN 3-0 FS-10 30 BLK (SUTURE) ×1 IMPLANT
SUTURE ETHLN 4-0 FS2 18XMF BLK (SUTURE) IMPLANT
SYR 3ML LL SCALE MARK (SYRINGE) ×3 IMPLANT
SYRINGE 10CC LL (SYRINGE) ×3 IMPLANT

## 2016-10-16 NOTE — Anesthesia Post-op Follow-up Note (Cosign Needed)
Anesthesia QCDR form completed.        

## 2016-10-16 NOTE — Op Note (Signed)
Operative note   Surgeon: Dr. Albertine Patricia, DPM.    Assistant: None    Preop diagnosis: Deep abscess and infection with likely osteomyelitis left foot and fifth metatarsal phalangeal joint.    Postop diagnosis: Same    Procedure:   1. Incision and drainage to left foot deep abscess and infection   2. Resection of infected necrotic soft tissue including skin and fatty tissue tendon, muscle TISSUE, and bone from the fifth metatarsal head and the base of the proximal phalanx fifth toe        EBL: 35 cc    Anesthesia:general R anesthesia team and local anesthetic delivered by me    Hemostasis: None    Specimen: Bone and necrotic tendon was sent from the fifth metatarsophalangeal joint. A deep wound culture was also taken from the wound from the dorsal aspect where it was severely abscessed    Complications: None    Operative indications: Patient had a chronic diabetic ulcer on the left foot that she was unaware of. She presented to the hospital yesterday with severe elevated white count of 21,000 and severe cellulitis and drainage from the wound. X-rays showed likely gas in the tissues on the dorsal aspect of the fifth metatarsophalangeal joint. The wound was in the submetatarsal 5 area.    Procedure:  Patient was brought into the OR and placed on the operating table in thesupine position. After anesthesia was obtained theleft lower extremity was prepped and draped in usual sterile fashion.  Operative Report: At this time attention was directed to the dorsum of the left fifth metatarsal phalangeal joint. There is an area of fluctuance just above the joint just under the skin. A linear incision was made from the base of the fifth toe to the dorsal aspect of the proximal third of the fifth and fourth metatarsal shafts. The incision was deepened particularly over the MTP joint where extensive purulent drainage was encountered. There was then explored for sinus tracts. There is one area  tracking dorsal medial this was explored and seen to be superficial over the tendons to the lesser digits. Some purulence was expressed from this area but mostly bleeding. Another track was proximal lateral this was explored approximately 56 cm proximally on the lateral side of the foot. A versa jet was used to enter both these areas they both seem to be superficial tracking and did not involve tendon structures. These areas were copiously irrigated as well as debrided and suctioned with a versa jet as well as pulse lavage.  Using the pulse lavage the plantar ulcer was identified and seen to be really only about 4-5 mm in diameter. It didn't probe deeply and penetrated the hip metatarsal phalangeal joint exposing bone and cartilage. Necrotic tissue was noted along the medial aspect of the joint including some of the bone and capsular tissue. This point I elected to resect the fifth metatarsal head and base of proximal phalanx as well. These were sent to pathology. Culture been taken earlier over the deep tissue that was heavy with purulence. By resecting the metatarsal head this should help to hopefully promote quicker healing to the plantar wound was reduced pressure and stress on the region. At this point the pulse lavage system was used to irrigate and suction 1000 cc of saline. The versa jet was used just to double check and make sure all necrotic tissue was removed. This was set at only about a level one at this time. Earlier heavier necrotic tissue was removed  with a level III versa jet. At this point the wound was closed distally but the area where there was more skin damage due to the abscess was left open and packed. The central portion of wound was then closed also with 3-0 nylon simple sutures. And the proximal portion wound was closed with 3-0 nylon simple interrupted sutures. A wet to dry packing was laced in the proximal third of the wound to allow for drainage and also in the distal third of the  wound to allow for drainage. A sterile compressive dressing was then placed across the wounds consisting of 4 x 4's, form Kling and an Ace wrap. Overall there was no tourniquet used but I felt patient had fairly adequate capillary bleeding.    Patient tolerated the procedure and anesthesia well.  Was transported from the OR to the PACU with all vital signs stable and vascular status intact. To be discharged per routine protocol.  Will follow up in approximately 1 week in the outpatient clinic.

## 2016-10-16 NOTE — Anesthesia Postprocedure Evaluation (Signed)
Anesthesia Post Note  Patient: Joann [name]  Procedure(s) Performed: Procedure(s) (LRB): INCISION AND DRAINAGE (Left)  Patient location during evaluation: PACU Anesthesia Type: General Level of consciousness: awake and alert Pain management: pain level controlled Vital Signs Assessment: post-procedure vital signs reviewed and stable Respiratory status: spontaneous breathing and respiratory function stable Cardiovascular status: stable Anesthetic complications: no     Last Vitals:  Vitals:   10/16/16 1256 10/16/16 1311  BP: (!) 129/59 (!) 105/93  Pulse: 97 91  Resp: 18 17  Temp:  37.2 C    Last Pain:  Vitals:   10/16/16 0851  TempSrc:   PainSc: 4                  Bently Morath K

## 2016-10-16 NOTE — Transfer of Care (Signed)
Immediate Anesthesia Transfer of Care Note  Patient: Joann [name]  Procedure(s) Performed: Procedure(s): INCISION AND DRAINAGE (Left)  Patient Location: PACU  Anesthesia Type:General  Level of Consciousness: sedated  Airway & Oxygen Therapy: Patient connected to face mask oxygen  Post-op Assessment: Post -op Vital signs reviewed and stable  Post vital signs: stable  Last Vitals:  Vitals:   10/16/16 0744 10/16/16 1240  BP: (!) 112/98 113/65  Pulse: 87 95  Resp: 19 18  Temp: 37.1 C     Last Pain:  Vitals:   10/16/16 0851  TempSrc:   PainSc: 4          Complications: No apparent anesthesia complications

## 2016-10-16 NOTE — Anesthesia Procedure Notes (Signed)
Procedure Name: LMA Insertion Date/Time: 10/16/2016 11:48 AM Performed by: Aline Brochure Pre-anesthesia Checklist: Patient identified, Emergency Drugs available, Suction available and Patient being monitored Patient Re-evaluated:Patient Re-evaluated prior to inductionOxygen Delivery Method: Circle system utilized Preoxygenation: Pre-oxygenation with 100% oxygen Intubation Type: IV induction LMA: LMA inserted LMA Size: 3.5 Number of attempts: 1 Placement Confirmation: positive ETCO2 and breath sounds checked- equal and bilateral Tube secured with: Tape Dental Injury: Teeth and Oropharynx as per pre-operative assessment

## 2016-10-16 NOTE — Consult Note (Cosign Needed)
Patient Demographics  Joann [name], is a 71 y.o. female   MRN: [id number]   DOB - Oct 19, 1945  Admit Date - 10/15/2016    Outpatient Primary MD for the patient is Leonel Ramsay, MD  Consult requested in the Hospital by Epifanio Lesches, MD, On 10/16/2016    Reason for consult diabetic foot infection cellulitis and abscess left foot   With History of -  Past Medical History:  Diagnosis Date  . Breast cancer ([facility]) 2007   right breast lumpectomy with 36 rad tx  . Diabetes mellitus without complication ([place])   . Hypertension       Past Surgical History:  Procedure Laterality Date  . BREAST BIOPSY Left 2000   benign  . BREAST BIOPSY Right 2007   positive.  Lumpectomy with rad tx    in for   Chief Complaint  Patient presents with  . Foot Pain     HPI  Joann [name]  is a 71 y.o. female, Patient was admitted into the ER last night CBC showed elevated white count of over 21,000. Patient is diabetic with what she thinks is no ulcer that just started last couple days but she's had a callus on that area for prolonged period. She states is first time she's never been hospitalized.    Review of Systems    In addition to the HPI above,  No Fever-chills, No Headache, No changes with Vision or hearing, occasionally some blurred vision otherwise nothing. No problems swallowing food or Liquids, No Chest pain, Cough or Shortness of Breath, No Abdominal pain, No Nausea or Vommitting, Bowel movements are regular, No Blood in stool or Urine, No dysuria, Patient has an open sore on the plantar left foot submetatarsal 5 area. Musculoskeletal shows swelling redness to the left foot,  No new weakness, tingling, numbness in any extremity, No recent weight gain or loss, No polyuria, polydypsia or  polyphagia, No significant Mental Stressors.  A full 10 point Review of Systems was done, except as stated above, all other Review of Systems were negative.   Social History Social History  Substance Use Topics  . Smoking status: Never Smoker  . Smokeless tobacco: Never Used  . Alcohol use No     Family History Family History  Problem Relation Age of Onset  . Dementia Mother   . CAD Mother   . CAD Father   . Deep vein thrombosis Father      Prior to Admission medications   Medication Sig Start Date End Date Taking? Authorizing Provider  hydrochlorothiazide (HYDRODIURIL) 12.5 MG tablet Take 12.5 mg by mouth every evening. 10/12/16  Yes Historical Provider, MD  lisinopril (PRINIVIL,ZESTRIL) 10 MG tablet Take 10 mg by mouth daily. 07/30/16  Yes Historical Provider, MD  metFORMIN (GLUCOPHAGE) 500 MG tablet Take 500 mg by mouth 2 (two) times daily. 09/30/16  Yes Historical Provider, MD    Anti-infectives    Start     Dose/Rate Route Frequency Ordered Stop   10/16/16 0400  vancomycin (VANCOCIN) IVPB 1000 mg/200 mL premix     1,000 mg 200 mL/hr over 60 Minutes Intravenous Every 18 hours 10/15/16 2235     10/16/16 0300  vancomycin (VANCOCIN) IVPB 1000 mg/200 mL premix  Status:  Discontinued     1,000 mg 200 mL/hr over 60 Minutes Intravenous Every 18 hours 10/15/16 2229 10/15/16 2235   10/16/16 0000  meropenem (MERREM) 1 g in sodium chloride 0.9 % 100 mL IVPB  Status:  Discontinued     1 g 200 mL/hr over 30 Minutes Intravenous Every 8 hours 10/15/16 2205 10/15/16 2207   10/16/16 0000  meropenem (MERREM) IVPB SOLR 1 g     1 g 100 mL/hr over 30 Minutes Intravenous Every 8 hours 10/15/16 2208     10/15/16 1900  vancomycin (VANCOCIN) IVPB 1000 mg/200 mL premix     1,000 mg 200 mL/hr over 60 Minutes Intravenous  Once 10/15/16 1845 10/15/16 2150   10/15/16 1900  cefTRIAXone (ROCEPHIN) IVPB 1 g     1 g 100 mL/hr over 30 Minutes Intravenous  Once 10/15/16 1846 10/15/16 2011   10/15/16  1845  cefTRIAXone (ROCEPHIN) 1 g in dextrose 5 % 50 mL IVPB  Status:  Discontinued     1 g 100 mL/hr over 30 Minutes Intravenous  Once 10/15/16 1845 10/15/16 1845      Scheduled Meds: . insulin aspart  0-5 Units Subcutaneous QHS  . insulin aspart  0-9 Units Subcutaneous TID WC  . lisinopril  10 mg Oral Daily  . meropenem  1 g Intravenous Q8H  . sodium chloride  1,000 mL Intravenous Once  . vancomycin  1,000 mg Intravenous Q18H   Continuous Infusions: . sodium chloride     PRN Meds:.acetaminophen **OR** acetaminophen  Allergies  Allergen Reactions  . Neosporin [Neomycin-Bacitracin Zn-Polymyx]     "it just don't agree with me"   . Penicillins Itching    Has patient had a PCN reaction causing immediate rash, facial/tongue/throat swelling, SOB or lightheadedness with hypotension: no Has patient had a PCN reaction causing severe rash involving mucus membranes or skin necrosis: no Has patient had a PCN reaction that required hospitalization no Has patient had a PCN reaction occurring within the last 10 years:no If all of the above answers are "NO", then may proceed with Cephalosporin use.   . Statins     hallucinations    Physical Exam  Vitals  Blood pressure (!) 112/98, pulse 87, temperature 98.8 F (37.1 C), temperature source Oral, resp. rate 19, height 5\' 3"  (1.6 m), weight 88.5 kg (195 lb 1.6 oz), SpO2 96 %.  Lower Extremity exam:  Vascular:Patient does have palpable DP pulse bilaterally. Difficult to palpate PT on the left just cause her some swelling back there is trace of palpability to the PT pulse left. Capillary refill times to toes is about 3 seconds bilaterally.  Dermatological: Patient has neuropathic ulcer submetatarsal 5 on the left foot. There is drainage coming from the area. The wound is approximately 1.5 cm diameter. Probes deeply. There is also significant erythema and redness to the dorsum of the foot with an area over the dorsal fifth MTP joint to its  fluctuant and likely is associated with gas and pus in the tissues.  Neurological: Patient was unaware she had diabetic neuropathy but more than likely has a pronounced diabetic neuropathy based on the inability to feel the ulceration.  Ortho: Soma cavovarus foot type which leads to increased prominence of the fifth metatarsal head. This is likely the impetus for callus formation and subsequent ulcer formation.  Data Review  CBC  Recent Labs Lab 10/15/16 1733 10/16/16 0324  WBC 21.9* 17.9*  HGB 13.6  12.4  HCT 40.9 37.2  PLT 457* 340  MCV 86.3 87.3  MCH 28.7 29.2  MCHC 33.3 33.4  RDW 13.5 13.3  LYMPHSABS 2.7  --   MONOABS 1.9*  --   EOSABS 0.1  --   BASOSABS 0.1  --    ------------------------------------------------------------------------------------------------------------------  Chemistries   Recent Labs Lab 10/15/16 1733 10/16/16 0324  NA 134* 134*  K 3.4* 3.6  CL 99* 101  CO2 25 25  GLUCOSE 133* 149*  BUN 18 16  CREATININE 0.94 0.80  CALCIUM 9.2 8.4*   ---------------------------------------------------------------------------------------------------------------------------------------------------------------------------------------------- Imaging results:   Dg Foot Complete Left  Result Date: 10/15/2016 CLINICAL DATA:  LEFT foot pain with redness.  History of diabetes. EXAM: LEFT FOOT - COMPLETE 3+ VIEW COMPARISON:  None. FINDINGS: There is air in the soft tissues lateral to the fifth MTP joint, and surrounding the MTP joint. No radiopaque foreign body is seen. There is marked soft tissue swelling. No osseous erosion is seen. Osteopenia is noted. IMPRESSION: Air in the soft tissues adjacent to, and surrounding, the fifth MTP joint. No fracture or osseous abnormality. Electronically Signed   By: Staci Righter M.D.   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 Color to the toes is still stable and I do have concerns about the likelihood of osteomyelitis in the fifth metatarsal phalangeal joint region. My biggest concern right now is to help get her infection under control. She was just admitted through the ER last night so I'll do an I&D on the area today and excise any infected tissue including skin and skin structures deep soft tissue including tendons and muscle and if necessary necrotic bone if encountered. I do not plan to amputate the fifth toe at this timeframe but that may be necessary ultimately.. I explained to the patient today that this will be likely a stage process to try to get this wound resolved but soft tissue because of the severity of her infection. She understands that if we do not proceed with this the infection is likely to worsen. She also understands she is at high risk for loss of toes part of the foot and potentially the lower leg. She also understands this is a life-threatening illness. We will take her to the OR today for the I&D and debridement. I will likely get an MRI on Monday after she has chance for this to calm down some. She may need further surgery next week  Active Problems:   Cellulitis  Family Communication: Plan discussed with patient and family Perry Mount M.D on 10/16/2016 at 9:50 AM  Thank you for the consult, we will follow the patient with you in the Hospital.

## 2016-10-16 NOTE — Progress Notes (Signed)
[place] [facility] [place] NAME: Joann [name]    MR#:  [id number]  DATE OF BIRTH:  [birth year]  SUBJECTIVE: seen at bedside, admitted yesterday because of gas gangrene and diabetic foot infection. Complains of a lot of pain on left foot. Left foot is red. Has l  CHIEF COMPLAINT:   Chief Complaint  Patient presents with  . Foot Pain    REVIEW OF SYSTEMS:   ROS CONSTITUTIONAL: No fever, fatigue or weakness.  EYES: No blurred or double vision.  EARS, NOSE, AND THROAT: No tinnitus or ear pain.  RESPIRATORY: No cough, shortness of breath, wheezing or hemoptysis.  CARDIOVASCULAR: No chest pain, orthopnea, edema.  GASTROINTESTINAL: No nausea, vomiting, diarrhea or abdominal pain.  GENITOURINARY: No dysuria, hematuria.  ENDOCRINE: No polyuria, nocturia,  HEMATOLOGY: No anemia, easy bruising or bleeding SKIN: No rash or lesion. MUSCULOSKELETAL: No joint pain or arthritis.   NEUROLOGIC: No tingling, numbness, weakness.  PSYCHIATRY: No anxiety or depression.   DRUG ALLERGIES:   Allergies  Allergen Reactions  . Neosporin [Neomycin-Bacitracin Zn-Polymyx]     "it just don't agree with me"   . Penicillins Itching    Has patient had a PCN reaction causing immediate rash, facial/tongue/throat swelling, SOB or lightheadedness with hypotension: no Has patient had a PCN reaction causing severe rash involving mucus membranes or skin necrosis: no Has patient had a PCN reaction that required hospitalization no Has patient had a PCN reaction occurring within the last 10 years:no If all of the above answers are "NO", then may proceed with Cephalosporin use.   . Statins     hallucinations    VITALS:  Blood pressure (!) 112/98, pulse 87, temperature 98.8 F (37.1 C), temperature source Oral, resp. rate 19, height 5\' 3"  (1.6 m), weight 88.5 kg (195 lb 1.6 oz), SpO2 96 %.  PHYSICAL EXAMINATION:  GENERAL:  71 y.o.-year-old patient lying in the bed  with no acute distress.  EYES: Pupils equal, round, reactive to light and accommodation. No scleral icterus. Extraocular muscles intact.  HEENT: Head atraumatic, normocephalic. Oropharynx and nasopharynx clear.  NECK:  Supple, no jugular venous distention. No thyroid enlargement, no tenderness.  LUNGS: Normal breath sounds bilaterally, no wheezing, rales,rhonchi or crepitation. No use of accessory muscles of respiration.  CARDIOVASCULAR: S1, S2 normal. No murmurs, rubs, or gallops.  ABDOMEN: Soft, nontender, nondistended. Bowel sounds present. No organomegaly or mass.  EXTREMITIES: No pedal edema, cyanosis, or clubbing.  NEUROLOGIC: Cranial nerves II through XII are intact. Muscle strength 5/5 in all extremities. Sensation intact. Gait not checked.  PSYCHIATRIC: The patient is alert and oriented x 3.  SKIN: Patient has neuropathic ulcer on fifth metatarsal on the left side with erythema and redness on the dorsum of the foot extending to the left the fifth of MTP joint with fluctuation.  LABORATORY PANEL:   CBC  Recent Labs Lab 10/16/16 0324  WBC 17.9*  HGB 12.4  HCT 37.2  PLT 340   ------------------------------------------------------------------------------------------------------------------  Chemistries   Recent Labs Lab 10/16/16 0324  NA 134*  K 3.6  CL 101  CO2 25  GLUCOSE 149*  BUN 16  CREATININE 0.80  CALCIUM 8.4*   ------------------------------------------------------------------------------------------------------------------  Cardiac Enzymes No results for input(s): TROPONINI in the last 168 hours. ------------------------------------------------------------------------------------------------------------------  RADIOLOGY:  Dg Foot Complete Left  Result Date: 10/15/2016 CLINICAL DATA:  LEFT foot pain with redness.  History of diabetes. EXAM: LEFT FOOT - COMPLETE 3+ VIEW COMPARISON:  None. FINDINGS: There  is air in the soft tissues lateral to the fifth MTP  joint, and surrounding the MTP joint. No radiopaque foreign body is seen. There is marked soft tissue swelling. No osseous erosion is seen. Osteopenia is noted. IMPRESSION: Air in the soft tissues adjacent to, and surrounding, the fifth MTP joint. No fracture or osseous abnormality. Electronically Signed   By: Staci Righter M.D.   On: 10/15/2016 18:49    EKG:   Orders placed or performed during the hospital encounter of 10/15/16  . EKG 12-Lead  . EKG 12-Lead  . ED EKG  . 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 2 diabetes mellitus type2.; Continue sliding scale with coverage, hold metformin for now  D/w  Husband and pts sister 3.essential hypertension: Continue lisinopril but hold her HCTZ.  All the records are reviewed and case discussed with Care Management/Social Workerr. Management plans discussed with the patient, family and they are in agreement.  CODE STATUS: full  TOTAL TIME TAKING CARE OF THIS PATIENT: 35 minutes.   POSSIBLE D/C IN 1-2 DAYS, DEPENDING ON CLINICAL CONDITION.   Epifanio Lesches M.D on 10/16/2016 at 10:23 AM  Between 7am to 6pm - Pager - [phone]  After 6pm go to [email or web address] - password [email or web address] [facility]  [facility]  Office  [phone]  CC: Primary care physician; FITZGERALD, DAVID Mamie Nick, MD   Note: This dictation was prepared with Dragon dictation along with smaller phrase technology. Any transcriptional errors that result from this process are unintentional.

## 2016-10-16 NOTE — Anesthesia Preprocedure Evaluation (Signed)
Anesthesia Evaluation  Patient identified by MRN, date of birth, ID band Patient awake    Reviewed: Allergy & Precautions, NPO status , Patient's Chart, lab work & pertinent test results  History of Anesthesia Complications Negative for: history of anesthetic complications  Airway Mallampati: II       Dental   Pulmonary neg pulmonary ROS,           Cardiovascular hypertension, Pt. on medications      Neuro/Psych negative neurological ROS     GI/Hepatic negative GI ROS, Neg liver ROS,   Endo/Other  diabetes, Type 2, Oral Hypoglycemic Agents  Renal/GU negative Renal ROS     Musculoskeletal   Abdominal   Peds  Hematology negative hematology ROS (+)   Anesthesia Other Findings   Reproductive/Obstetrics                             Anesthesia Physical Anesthesia Plan  ASA: III  Anesthesia Plan: General   Post-op Pain Management:    Induction: Intravenous  Airway Management Planned:   Additional Equipment:   Intra-op Plan:   Post-operative Plan:   Informed Consent: I have reviewed the patients History and Physical, chart, labs and discussed the procedure including the risks, benefits and alternatives for the proposed anesthesia with the patient or authorized representative who has indicated his/her understanding and acceptance.     Plan Discussed with:   Anesthesia Plan Comments:         Anesthesia Quick Evaluation

## 2016-10-17 ENCOUNTER — Encounter: Payer: Self-pay | Admitting: Podiatry

## 2016-10-17 LAB — BASIC METABOLIC PANEL
ANION GAP: 5 (ref 5–15)
BUN: 11 mg/dL (ref 6–20)
CALCIUM: 8.2 mg/dL — AB (ref 8.9–10.3)
CO2: 25 mmol/L (ref 22–32)
CREATININE: 0.65 mg/dL (ref 0.44–1.00)
Chloride: 105 mmol/L (ref 101–111)
GFR calc Af Amer: >60 mL/min (ref >60)
Glucose, Bld: 148 mg/dL — ABNORMAL HIGH (ref 65–99)
Potassium: 3.6 mmol/L (ref 3.5–5.1)
Sodium: 135 mmol/L (ref 135–145)

## 2016-10-17 LAB — GLUCOSE, CAPILLARY
GLUCOSE-CAPILLARY: 133 mg/dL — AB (ref 65–99)
GLUCOSE-CAPILLARY: 176 mg/dL — AB (ref 65–99)
GLUCOSE-CAPILLARY: 99 mg/dL (ref 65–99)
GLUCOSE-CAPILLARY: 123 mg/dL — AB (ref 65–99)

## 2016-10-17 LAB — CBC
HEMATOCRIT: 34.5 % — AB (ref 35.0–47.0)
Hemoglobin: 11.6 g/dL — ABNORMAL LOW (ref 12.0–16.0)
MCH: 29.7 pg (ref 26.0–34.0)
MCHC: 33.6 g/dL (ref 32.0–36.0)
MCV: 88.4 fL (ref 80.0–100.0)
Platelets: 332 10*3/uL (ref 150–440)
RBC: 3.9 MIL/uL (ref 3.80–5.20)
RDW: 13.4 % (ref 11.5–14.5)
WBC: 13.1 10*3/uL — AB (ref 3.6–11.0)

## 2016-10-17 LAB — HEMOGLOBIN A1C
Hgb A1c MFr Bld: 6.9 % — ABNORMAL HIGH (ref 4.8–5.6)
Mean Plasma Glucose: 151 mg/dL

## 2016-10-17 MED ORDER — METFORMIN HCL 500 MG PO TABS
500.0000 mg | ORAL_TABLET | Freq: Two times a day (BID) | ORAL | Status: DC
  Administered 2016-10-17 – 2016-10-19 (×5): 500 mg via ORAL
  Filled 2016-10-17 (×5): qty 1

## 2016-10-17 NOTE — Progress Notes (Signed)
Patient Demographics  Joann [name], is a 71 y.o. female   MRN: [id number]   DOB - Jan 02, 1946  Admit Date - 10/15/2016    Outpatient Primary MD for the patient is Leonel Ramsay, MD  Consult requested in the Hospital by Epifanio Lesches, MD, On 10/17/2016   With History of -  Past Medical History:  Diagnosis Date  . Breast cancer ([place]) 2007   right breast lumpectomy with 36 rad tx  . Diabetes mellitus without complication ([place])   . Hypertension       Past Surgical History:  Procedure Laterality Date  . BREAST BIOPSY Left 2000   benign  . BREAST BIOPSY Right 2007   positive.  Lumpectomy with rad tx  . INCISION AND DRAINAGE Left 10/16/2016   Procedure: INCISION AND DRAINAGE;  Surgeon: Albertine Patricia, DPM;  Location: [facility] ORS;  Service: Podiatry;  Laterality: Left;    in for   Chief Complaint  Patient presents with  . Foot Pain     HPI  Joann [name]  is a 71 y.o. female, One day status post incision and drainage of infection abscess and removal of infected skin and soft tissue and muscle tendon and bone from the left fifth metatarsophalangeal joint. States she is doing well today with no real pain and is not having fever temperature appetite is good.    Review of Systems    In addition to the HPI above,  No Fever-chills, No Headache, No changes with Vision or hearing, No problems swallowing food or Liquids, No Chest pain, Cough or Shortness of Breath, No Abdominal pain, No Nausea or Vommitting, Bowel movements are regular, No Blood in stool or Urine, No dysuria, No new skin rashes or bruises, No new joints pains-aches,  No new weakness, tingling, numbness in any extremity, No recent weight gain or loss, No polyuria, polydypsia or polyphagia, No significant Mental Stressors.  A  full 10 point Review of Systems was done, except as stated above, all other Review of Systems were negative.   Social History Social History  Substance Use Topics  . Smoking status: Never Smoker  . Smokeless tobacco: Never Used  . Alcohol use No  Family History Family History  Problem Relation Age of Onset  . Dementia Mother   . CAD Mother   . CAD Father   . Deep vein thrombosis Father     Prior to Admission medications   Medication Sig Start Date End Date Taking? Authorizing Provider  hydrochlorothiazide (HYDRODIURIL) 12.5 MG tablet Take 12.5 mg by mouth every evening. 10/12/16  Yes Historical Provider, MD  lisinopril (PRINIVIL,ZESTRIL) 10 MG tablet Take 10 mg by mouth daily. 07/30/16  Yes Historical Provider, MD  metFORMIN (GLUCOPHAGE) 500 MG tablet Take 500 mg by mouth 2 (two) times daily. 09/30/16  Yes Historical Provider, MD    Anti-infectives    Start     Dose/Rate Route Frequency Ordered Stop   10/16/16 0400  vancomycin (VANCOCIN) IVPB 1000 mg/200 mL premix     1,000 mg 200 mL/hr over 60 Minutes Intravenous Every 18 hours 10/15/16 2235     10/16/16 0300  vancomycin (VANCOCIN) IVPB 1000 mg/200 mL premix  Status:  Discontinued     1,000 mg 200 mL/hr over 60 Minutes Intravenous Every 18 hours 10/15/16 2229 10/15/16 2235   10/16/16 0000  meropenem (MERREM) 1 g in sodium chloride 0.9 % 100 mL IVPB  Status:  Discontinued     1 g 200 mL/hr over 30 Minutes Intravenous Every 8 hours 10/15/16 2205 10/15/16 2207   10/16/16 0000  meropenem (MERREM) IVPB SOLR 1 g     1 g 100 mL/hr over 30 Minutes Intravenous Every 8 hours 10/15/16 2208     10/15/16 1900  vancomycin (VANCOCIN) IVPB 1000 mg/200 mL premix     1,000 mg 200 mL/hr over 60 Minutes Intravenous  Once 10/15/16 1845 10/15/16 2150   10/15/16 1900  cefTRIAXone (ROCEPHIN) IVPB 1 g     1 g 100 mL/hr over 30 Minutes Intravenous  Once 10/15/16 1846 10/15/16 2011   10/15/16 1845  cefTRIAXone (ROCEPHIN) 1 g in dextrose 5 % 50 mL IVPB   Status:  Discontinued     1 g 100 mL/hr over 30 Minutes Intravenous  Once 10/15/16 1845 10/15/16 1845      Scheduled Meds: . insulin aspart  0-5 Units Subcutaneous QHS  . insulin aspart  0-9 Units Subcutaneous TID WC  . lisinopril  10 mg Oral Daily  . meropenem  1 g Intravenous Q8H  . metFORMIN  500 mg Oral BID WC  . sodium chloride  1,000 mL Intravenous Once  . vancomycin  1,000 mg Intravenous Q18H   Continuous Infusions: PRN Meds:.acetaminophen **OR** acetaminophen, oxyCODONE  Allergies  Allergen Reactions  . Neosporin [Neomycin-Bacitracin Zn-Polymyx]     "it just don't agree with me"   . Penicillins Itching    Has patient had a PCN reaction causing immediate rash, facial/tongue/throat swelling, SOB or lightheadedness with hypotension: no Has patient had a PCN reaction causing severe rash involving mucus membranes or skin necrosis: no Has patient had a PCN reaction that required hospitalization no Has patient had a PCN reaction occurring within the last 10 years:no If all of the above answers are "NO", then may proceed with Cephalosporin use.   . Statins     hallucinations    Physical Exam  Vitals  Blood pressure 119/63, pulse 78, temperature 98.5 F (36.9 C), temperature source Oral, resp. rate 19, height 5\' 3"  (1.6 m), weight 88.5 kg (195 lb 1.6 oz), SpO2 95 %.  Lower Extremity exam:Exam today shows the redness cellulitis has definitively improved. Drainage from the wounds within normal limits this point incision margins. Stable there is a central portion where there is some hemorrhagic changes to the incision margin which may dehisce over time but overall wound looks much better and foot looks much better as a whole. Most of the lymphadenopathy started to recede.  Data Review  CBC  Recent Labs Lab 10/15/16 1733 10/16/16 0324 10/17/16 0327  WBC 21.9* 17.9* 13.1*  HGB 13.6 12.4 11.6*  HCT 40.9 37.2 34.5*  PLT 457* 340 332  MCV 86.3 87.3 88.4  MCH 28.7 29.2  29.7  MCHC 33.3 33.4 33.6  RDW 13.5 13.3 13.4  LYMPHSABS 2.7  --   --   MONOABS 1.9*  --   --   EOSABS 0.1  --   --   BASOSABS 0.1  --   --    ------------------------------------------------------------------------------------------------------------------  Chemistries   Recent Labs Lab 10/15/16 1733 10/16/16 0324 10/17/16 0327  NA 134* 134* 135  K 3.4* 3.6 3.6  CL 99* 101 105  CO2 25 25  25  GLUCOSE 133* 149* 148*  BUN 18 16 11   CREATININE 0.94 0.80 0.65  CALCIUM 9.2 8.4* 8.2*   ------------------------------------------------------------------------------------------------------------------ Assessment & Plan: Significant improvement with left foot. White count was down considerably at this point. Cellulitis is improving. Wound is stable. Plan: Change repack the area today. Ordered an OrthoWedge postop shoe for start using tomorrow for bathroom privileges and transfer. My consider infectious disease consult for some longer-term IV antibiotics.  Active Problems:   Cellulitis     Family Communication: Plan discussed with patient and **   Thank you for the consult, we will follow the patient with you in the Hospital.   Perry Mount M.D on 10/17/2016 at 9:52 AM  Thank you for the consult, we will follow the patient with you in the Hospital.

## 2016-10-17 NOTE — Progress Notes (Signed)
PT Cancellation Note  Patient Details Name: Joann [name] MRN: [id number] DOB: 24-Jan-1946   Cancelled Treatment:    Reason Eval/Treat Not Completed: Patient not medically ready. Per podiatry note patient is not cleared for weightbearing until tomorrow when she can complete transfers and bathroom ambulation with Orthowedge post-op shoe. Given this, will hold to get OOB mobility assessment until tomorrow.   Royce Macadamia PT, DPT, CSCS    10/17/2016, 1:30 PM

## 2016-10-17 NOTE — Progress Notes (Signed)
[facility] [place] NAME: Schelly Chuba    MR#:  [id number]  DATE OF BIRTH:  14-Jan-1946  SUBJECTIVE: seen at bedside, admitted yesterday because of gas gangrene and diabetic foot infection. Post incision and drainage of abscess of the left foot fifth metatarsophalangeal joint. 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 Patient tolerated the procedure well. Cultures from the infection site are pending. Afebrile. Patient denies any complaints, slept well.   CHIEF COMPLAINT:   Chief Complaint  Patient presents with  . Foot Pain    REVIEW OF SYSTEMS:   ROS CONSTITUTIONAL: No fever, fatigue or weakness.  EYES: No blurred or double vision.  EARS, NOSE, AND THROAT: No tinnitus or ear pain.  RESPIRATORY: No cough, shortness of breath, wheezing or hemoptysis.  CARDIOVASCULAR: No chest pain, orthopnea, edema.  GASTROINTESTINAL: No nausea, vomiting, diarrhea or abdominal pain.  GENITOURINARY: No dysuria, hematuria.  ENDOCRINE: No polyuria, nocturia,  HEMATOLOGY: No anemia, easy bruising or bleeding SKIN: No rash or lesion. MUSCULOSKELETAL:Dressing present on left foot.Marland Kitchen   NEUROLOGIC: No tingling, numbness, weakness.  PSYCHIATRY: No anxiety or depression.   DRUG ALLERGIES:   Allergies  Allergen Reactions  . Neosporin [Neomycin-Bacitracin Zn-Polymyx]     "it just don't agree with me"   . Penicillins Itching    Has patient had a PCN reaction causing immediate rash, facial/tongue/throat swelling, SOB or lightheadedness with hypotension: no Has patient had a PCN reaction causing severe rash involving mucus membranes or skin necrosis: no Has patient had a PCN reaction that required hospitalization no Has patient had a PCN reaction occurring within the last 10 years:no If all of the above answers are "NO", then may proceed with Cephalosporin use.   . Statins     hallucinations    VITALS:  Blood pressure 119/63, pulse 78, temperature  98.5 F (36.9 C), temperature source Oral, resp. rate 19, height 5\' 3"  (1.6 m), weight 88.5 kg (195 lb 1.6 oz), SpO2 95 %.  PHYSICAL EXAMINATION:  GENERAL:  71 y.o.-year-old patient lying in the bed with no acute distress.  EYES: Pupils equal, round, reactive to light and accommodation. No scleral icterus. Extraocular muscles intact.  HEENT: Head atraumatic, normocephalic. Oropharynx and nasopharynx clear.  NECK:  Supple, no jugular venous distention. No thyroid enlargement, no tenderness.  LUNGS: Normal breath sounds bilaterally, no wheezing, rales,rhonchi or crepitation. No use of accessory muscles of respiration.  CARDIOVASCULAR: S1, S2 normal. No murmurs, rubs, or gallops.  ABDOMEN: Soft, nontender, nondistended. Bowel sounds present. No organomegaly or mass.  EXTREMITIES: No pedal edema, cyanosis, or clubbing.  NEUROLOGIC: Cranial nerves II through XII are intact. Muscle strength 5/5 in all extremities. Sensation intact. Gait not checked.  PSYCHIATRIC: The patient is alert and oriented x 3.  SKIN: dressing present on left foot.  LABORATORY PANEL:   CBC  Recent Labs Lab 10/17/16 0327  WBC 13.1*  HGB 11.6*  HCT 34.5*  PLT 332   ------------------------------------------------------------------------------------------------------------------  Chemistries   Recent Labs Lab 10/17/16 0327  NA 135  K 3.6  CL 105  CO2 25  GLUCOSE 148*  BUN 11  CREATININE 0.65  CALCIUM 8.2*   ------------------------------------------------------------------------------------------------------------------  Cardiac Enzymes No results for input(s): TROPONINI in the last 168 hours. ------------------------------------------------------------------------------------------------------------------  RADIOLOGY:  Dg Foot Complete Left  Result Date: 10/15/2016 CLINICAL DATA:  LEFT foot pain with redness.  History of diabetes. EXAM: LEFT FOOT - COMPLETE 3+ VIEW COMPARISON:  None. FINDINGS: There  is air in  the soft tissues lateral to the fifth MTP joint, and surrounding the MTP joint. No radiopaque foreign body is seen. There is marked soft tissue swelling. No osseous erosion is seen. Osteopenia is noted. IMPRESSION: Air in the soft tissues adjacent to, and surrounding, the fifth MTP joint. No fracture or osseous abnormality. Electronically Signed   By: Staci Righter M.D.   On: 10/15/2016 18:49    EKG:   Orders placed or performed during the hospital encounter of 10/15/16  . EKG 12-Lead  . EKG 12-Lead  . ED EKG  . 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 Follow wound cultures, continue IV antibiotics, further treatment as per podiatry  recommendation..   2 diabetes mellitus type2.; Continue sliding scale with coverage, hold metformin for now    D/w  Husband and pts sister  3.essential hypertension: Continue lisinopril but hold her HCTZ.  All the records are reviewed and case discussed with Care Management/Social Workerr. Management plans discussed with the patient, family and they are in agreement.  CODE STATUS: full  TOTAL TIME TAKING CARE OF THIS PATIENT: 35 minutes.   POSSIBLE D/C IN 1-2 DAYS, DEPENDING ON CLINICAL CONDITION.   Epifanio Lesches M.D on 10/17/2016 at 7:59 AM  Between 7am to 6pm - Pager - [phone]  After 6pm go to [email or web address] - password [email or web address]  [facility]  Office  [phone]  CC: Primary care physician; FITZGERALD, DAVID Mamie Nick, MD   Note: This dictation was prepared with Dragon dictation along with smaller phrase technology. Any transcriptional errors that result from this process are unintentional.

## 2016-10-18 LAB — GLUCOSE, CAPILLARY
GLUCOSE-CAPILLARY: 112 mg/dL — AB (ref 65–99)
GLUCOSE-CAPILLARY: 115 mg/dL — AB (ref 65–99)
Glucose-Capillary: 157 mg/dL — ABNORMAL HIGH (ref 65–99)

## 2016-10-18 LAB — CBC
HCT: 35.4 % (ref 35.0–47.0)
Hemoglobin: 11.8 g/dL — ABNORMAL LOW (ref 12.0–16.0)
MCH: 29.7 pg (ref 26.0–34.0)
MCHC: 33.5 g/dL (ref 32.0–36.0)
MCV: 88.7 fL (ref 80.0–100.0)
PLATELETS: 374 10*3/uL (ref 150–440)
RBC: 3.99 MIL/uL (ref 3.80–5.20)
RDW: 13.6 % (ref 11.5–14.5)
WBC: 11.2 10*3/uL — ABNORMAL HIGH (ref 3.6–11.0)

## 2016-10-18 LAB — VANCOMYCIN, TROUGH: Vancomycin Tr: 9 ug/mL — ABNORMAL LOW (ref 15–20)

## 2016-10-18 MED ORDER — ENOXAPARIN SODIUM 40 MG/0.4ML ~~LOC~~ SOLN
40.0000 mg | SUBCUTANEOUS | Status: DC
  Administered 2016-10-18: 40 mg via SUBCUTANEOUS
  Filled 2016-10-18: qty 0.4

## 2016-10-18 MED ORDER — METRONIDAZOLE 500 MG PO TABS
500.0000 mg | ORAL_TABLET | Freq: Three times a day (TID) | ORAL | Status: DC
  Administered 2016-10-18 – 2016-10-19 (×3): 500 mg via ORAL
  Filled 2016-10-18 (×3): qty 1

## 2016-10-18 MED ORDER — DEXTROSE 5 % IV SOLN
2.0000 g | Freq: Three times a day (TID) | INTRAVENOUS | Status: DC
  Filled 2016-10-18 (×2): qty 2

## 2016-10-18 MED ORDER — VANCOMYCIN HCL IN DEXTROSE 1-5 GM/200ML-% IV SOLN
1000.0000 mg | Freq: Two times a day (BID) | INTRAVENOUS | Status: DC
  Administered 2016-10-18 – 2016-10-19 (×2): 1000 mg via INTRAVENOUS
  Filled 2016-10-18 (×3): qty 200

## 2016-10-18 MED ORDER — DEXTROSE 5 % IV SOLN
2.0000 g | Freq: Two times a day (BID) | INTRAVENOUS | Status: DC
  Administered 2016-10-18 – 2016-10-19 (×2): 2 g via INTRAVENOUS
  Filled 2016-10-18 (×3): qty 2

## 2016-10-18 NOTE — Evaluation (Signed)
Physical Therapy Evaluation Patient Details Name: Joann [name] MRN: [id number] DOB: 1945/10/01 Today's Date: 10/18/2016   History of Present Illness  Pt is a 71 y/o F s/p incision and drainage of infection abscess and removal of infected skin and soft tissue and muscle tendon and bone from L 5th metatarsophalangeal joint.  Pt's PMH includes breast cancer.      Clinical Impression  Pt admitted with above diagnosis. Pt currently with functional limitations due to the deficits listed below (see PT Problem List). Joann [name] was Ind PTA and denies any falls in the past 6 months.  She currently requires min guard assist for sit<>stand and stand pivot transfers using RW to adhere to PWB LLE. Pt's husband will have 24/7 assist/supervision available from her husband at d/c.  Pt will benefit from skilled PT to increase their independence and safety with mobility to allow discharge to the venue listed below.      Follow Up Recommendations No PT follow up    Equipment Recommendations  Rolling walker with 5" wheels    Recommendations for Other Services       Precautions / Restrictions Precautions Precautions: Fall Required Braces or Orthoses: Other Brace/Splint Other Brace/Splint: Orthowedge for L foot Restrictions Weight Bearing Restrictions: Yes LLE Weight Bearing: Partial weight bearing LLE Partial Weight Bearing Percentage or Pounds: 50 Other Position/Activity Restrictions: Per PT order from Albertine Patricia, "Walker training for partial WB for left foot. BRP and transfer only."      Mobility  Bed Mobility Overal bed mobility: Modified Independent             General bed mobility comments: Increased effort but does not require physical assist or cues  Transfers Overall transfer level: Needs assistance Equipment used: Rolling walker (2 wheeled) Transfers: Sit to/from [facility] Sit to Stand: Min guard Stand pivot transfers: Min guard       General transfer  comment: Max verbal cues for hand placement as pt consistently attempts to stand with Bil hands on RW.  Cues to reach back for armrests when preparing to sit.  Pt steady with orthowedge shoe and RW when pivoting to sit in chair.  Ambulation/Gait             General Gait Details: Unable to attempt due to activity orders as documented above.  Stairs            Wheelchair Mobility    Modified Rankin (Stroke Patients Only)       Balance Overall balance assessment: Needs assistance Sitting-balance support: No upper extremity supported;Feet supported Sitting balance-Leahy Scale: Good     Standing balance support: Bilateral upper extremity supported;During functional activity Standing balance-Leahy Scale: Poor Standing balance comment: Required RW to adhere to PWB LLE                             Pertinent Vitals/Pain Pain Assessment: No/denies pain    Home Living Family/patient expects to be discharged to:: Private residence Living Arrangements: Spouse/significant other Available Help at Discharge: Family;Available 24 hours/day Type of Home: House Home Access: Stairs to enter Entrance Stairs-Rails: Left Entrance Stairs-Number of Steps: 2 Home Layout: Two level;Laundry or work area in [place]: Environmental consultant - 4 wheels;Wheelchair - manual;Cane - single point;Bedside commode (Rollator) Additional Comments: Pt lives on main floor. Laundry in basement.    Prior Function Level of Independence: Independent         Comments: Was not ambulating  with AD PTA and denies any falls over the past 6 months.     Hand Dominance        Extremity/Trunk Assessment   Upper Extremity Assessment Upper Extremity Assessment: Overall WFL for tasks assessed    Lower Extremity Assessment Lower Extremity Assessment: LLE deficits/detail LLE Deficits / Details: Unable to assess ankle/foot due to immobilization.  Strength otherwise grossly WNL.    Cervical /  Trunk Assessment Cervical / Trunk Assessment: Kyphotic  Communication   Communication: No difficulties  Cognition Arousal/Alertness: Awake/alert Behavior During Therapy: WFL for tasks assessed/performed Overall Cognitive Status: Within Functional Limits for tasks assessed                                        General Comments      Exercises General Exercises - Lower Extremity Long Arc Quad: AROM;Both;10 reps;Seated Hip ABduction/ADduction: AROM;Both;10 reps;Supine Straight Leg Raises: AROM;Both;10 reps;Supine   Assessment/Plan    PT Assessment Patient needs continued PT services  PT Problem List Decreased balance;Decreased knowledge of use of DME;Decreased safety awareness;Decreased knowledge of precautions       PT Treatment Interventions DME instruction;Stair training;Gait training;Functional mobility training;Therapeutic activities;Therapeutic exercise;Balance training;Patient/family education;Modalities    PT Goals (Current goals can be found in the Care Plan section)  Acute Rehab PT Goals Patient Stated Goal: to go home PT Goal Formulation: With patient Time For Goal Achievement: 11/01/16 Potential to Achieve Goals: Good    Frequency Min 2X/week   Barriers to discharge        Co-evaluation               End of Session Equipment Utilized During Treatment: Gait belt Activity Tolerance: Patient tolerated treatment well Patient left: in chair;with call bell/phone within reach;with chair alarm set Nurse Communication: Mobility status;Weight bearing status PT Visit Diagnosis: Unsteadiness on feet (R26.81)    Time: 1937-9024 PT Time Calculation (min) (ACUTE ONLY): 21 min   Charges:   PT Evaluation $PT Eval Low Complexity: 1 Procedure     PT G CodesCollie Siad PT, DPT 10/18/2016, 9:16 AM

## 2016-10-18 NOTE — Progress Notes (Signed)
Infectious Disease Long Term IV Antibiotic Orders  Diagnosis: Dm foot infection   Culture results Pending  Lab Results  Component Value Date   CREATININE 0.65 10/17/2016   Lab Results  Component Value Date   WBC 11.2 (H) 10/18/2016   HGB 11.8 (L) 10/18/2016   HCT 35.4 10/18/2016   MCV 88.7 10/18/2016   PLT 374 10/18/2016    Allergies:  Allergies  Allergen Reactions  . Neosporin [Neomycin-Bacitracin Zn-Polymyx]     "it just don't agree with me"   . Penicillins Itching    Has patient had a PCN reaction causing immediate rash, facial/tongue/throat swelling, SOB or lightheadedness with hypotension: no Has patient had a PCN reaction causing severe rash involving mucus membranes or skin necrosis: no Has patient had a PCN reaction that required hospitalization no Has patient had a PCN reaction occurring within the last 10 years:no If all of the above answers are "NO", then may proceed with Cephalosporin use.   . Statins     hallucinations    Discharge antibiotics - may be adjusted within 2-4 days so do not deliver more than 2-4 days supply Vancomycin        1000          mg  every    12           hours .     Goal vancomycin trough 15-20.    Pharmacy to adjust dosing based on levels Cefepime 2 gm q 12 hours Flagyl 500 mg po q 8 hours  PICC Care per protocol Labs weekly while on IV antibiotics      CBC w diff   Comprehensive met panel Vancomycin Trough   CRP   Planned duration of antibiotics 2-4 weeks from 3/24  Stop date 11/02/16 Follow up clinic date Within 2 weeks  FAX weekly labs to [phone]  David P Fitzgerald, MD  

## 2016-10-18 NOTE — Progress Notes (Signed)
Joann [name] at Joann [name] NAME: Joann [name]    MR#:  [id number]  DATE OF BIRTH:  April 19, 1946  SUBJECTIVE:   CHIEF COMPLAINT:   Chief Complaint  Patient presents with  . Foot Pain   Mild left foot pain. Controlled with Tylenol. Worked with physical therapy. Afebrile. Dressing changed by podiatry.  REVIEW OF SYSTEMS:   ROS CONSTITUTIONAL: No fever, fatigue or weakness.  EYES: No blurred or double vision.  EARS, NOSE, AND THROAT: No tinnitus or ear pain.  RESPIRATORY: No cough, shortness of breath, wheezing or hemoptysis.  CARDIOVASCULAR: No chest pain, orthopnea, edema.  GASTROINTESTINAL: No nausea, vomiting, diarrhea or abdominal pain.  GENITOURINARY: No dysuria, hematuria.  ENDOCRINE: No polyuria, nocturia,  HEMATOLOGY: No anemia, easy bruising or bleeding SKIN: No rash or lesion. MUSCULOSKELETAL:Dressing present on left foot.Marland Kitchen   NEUROLOGIC: No tingling, numbness, weakness.  PSYCHIATRY: No anxiety or depression.   DRUG ALLERGIES:   Allergies  Allergen Reactions  . Neosporin [Neomycin-Bacitracin Zn-Polymyx]     "it just don't agree with me"   . Penicillins Itching    Has patient had a PCN reaction causing immediate rash, facial/tongue/throat swelling, SOB or lightheadedness with hypotension: no Has patient had a PCN reaction causing severe rash involving mucus membranes or skin necrosis: no Has patient had a PCN reaction that required hospitalization no Has patient had a PCN reaction occurring within the last 10 years:no If all of the above answers are "NO", then may proceed with Cephalosporin use.   . Statins     hallucinations    VITALS:  Blood pressure 101/69, pulse 71, temperature 98 F (36.7 C), temperature source Oral, resp. rate 18, height 5\' 3"  (1.6 m), weight 88.5 kg (195 lb 1.6 oz), SpO2 96 %.  PHYSICAL EXAMINATION:  GENERAL:  71 y.o.-year-old patient lying in the bed with no acute distress.  EYES: Pupils  equal, round, reactive to light and accommodation. No scleral icterus. Extraocular muscles intact.  HEENT: Head atraumatic, normocephalic. Oropharynx and nasopharynx clear.  NECK:  Supple, no jugular venous distention. No thyroid enlargement, no tenderness.  LUNGS: Normal breath sounds bilaterally, no wheezing, rales,rhonchi or crepitation. No use of accessory muscles of respiration.  CARDIOVASCULAR: S1, S2 normal. No murmurs, rubs, or gallops.  ABDOMEN: Soft, nontender, nondistended. Bowel sounds present. No organomegaly or mass.  EXTREMITIES: No pedal edema, cyanosis, or clubbing.  NEUROLOGIC: Cranial nerves II through XII are intact. Muscle strength 5/5 in all extremities. Sensation intact. Gait not checked.  PSYCHIATRIC: The patient is alert and oriented x 3.  SKIN: dressing present on left foot.  LABORATORY PANEL:   CBC  Recent Labs Lab 10/18/16 0309  WBC 11.2*  HGB 11.8*  HCT 35.4  PLT 374   ------------------------------------------------------------------------------------------------------------------  Chemistries   Recent Labs Lab 10/17/16 0327  NA 135  K 3.6  CL 105  CO2 25  GLUCOSE 148*  BUN 11  CREATININE 0.65  CALCIUM 8.2*   ------------------------------------------------------------------------------------------------------------------  Cardiac Enzymes No results for input(s): TROPONINI in the last 168 hours. ------------------------------------------------------------------------------------------------------------------  RADIOLOGY:  No results found.  EKG:   Orders placed or performed during the hospital encounter of 10/15/16  . EKG 12-Lead  . EKG 12-Lead  . ED EKG  . 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 Follow wound cultures continue IV antibiotics Consult infectious disease for antibiotic recommendations  * Diabetes mellitus type2.; Continue sliding scale with coverage  * Essential hypertension: Continue lisinopril   All the records are reviewed and case discussed with Care Management/Social Workerr. Management plans discussed with the patient, family and they are in agreement.  CODE STATUS: full  TOTAL TIME TAKING CARE OF THIS PATIENT: 35 minutes.   POSSIBLE D/C IN 1-2 DAYS, DEPENDING ON CLINICAL CONDITION.  Joann [name] R M.D on 10/18/2016 at 2:21 PM  Between 7am to 6pm - Pager - [phone]  After 6pm go to [email or web address] - password [email or web address] [facility]  [facility]  Office  [phone]  CC: Primary care physician; FITZGERALD, Joann Mamie Nick, MD   Note: This dictation was prepared with Dragon dictation along with smaller phrase technology. Any transcriptional errors that result from this process are unintentional.

## 2016-10-18 NOTE — Consult Note (Signed)
[facility] Infectious Disease     Reason for Consult:DM foot infection     Referring Physician: Hillary Bow Date of Admission:  10/15/2016   Active Problems:   Cellulitis   HPI: ASNA MULDROW is a 71 y.o. female admitted with foot pain and found to have an ulcer with cellulitis and leukocytosis. XRay showed air in tissue. Went for surgery 3/24 by Dr Elvina Mattes with resection of infected necrotic tissue down to bone of the 5th MT head. On admit temp 101, wbc 18 -> 11 no with meropenem and vanco. Cx pending.  A1c 6.9.  PCn allergic  Past Medical History:  Diagnosis Date  . Breast cancer ([place]) 2007   right breast lumpectomy with 36 rad tx  . Diabetes mellitus without complication ([place])   . Hypertension    Past Surgical History:  Procedure Laterality Date  . BREAST BIOPSY Left 2000   benign  . BREAST BIOPSY Right 2007   positive.  Lumpectomy with rad tx  . INCISION AND DRAINAGE Left 10/16/2016   Procedure: INCISION AND DRAINAGE;  Surgeon: Albertine Patricia, DPM;  Location: [facility] ORS;  Service: Podiatry;  Laterality: Left;   Social History  Substance Use Topics  . Smoking status: Never Smoker  . Smokeless tobacco: Never Used  . Alcohol use No   Family History  Problem Relation Age of Onset  . Dementia Mother   . CAD Mother   . CAD Father   . Deep vein thrombosis Father     Allergies:  Allergies  Allergen Reactions  . Neosporin [Neomycin-Bacitracin Zn-Polymyx]     "it just don't agree with me"   . Penicillins Itching    Has patient had a PCN reaction causing immediate rash, facial/tongue/throat swelling, SOB or lightheadedness with hypotension: no Has patient had a PCN reaction causing severe rash involving mucus membranes or skin necrosis: no Has patient had a PCN reaction that required hospitalization no Has patient had a PCN reaction occurring within the last 10 years:no If all of the above answers are "NO", then may proceed with Cephalosporin use.   . Statins      hallucinations    Current antibiotics: Antibiotics Given (last 72 hours)    Date/Time Action Medication Dose Rate   10/15/16 1941 Given   cefTRIAXone (ROCEPHIN) IVPB 1 g 1 g 100 mL/hr   10/15/16 2341 Given   meropenem (MERREM) IVPB SOLR 1 g 1 g 100 mL/hr   10/16/16 0342 Given   vancomycin (VANCOCIN) IVPB 1000 mg/200 mL premix 1,000 mg 200 mL/hr   10/16/16 0850 Given   meropenem (MERREM) IVPB SOLR 1 g 1 g 100 mL/hr   10/16/16 1603 Given   meropenem (MERREM) IVPB SOLR 1 g 1 g 100 mL/hr   10/16/16 2215 Given   vancomycin (VANCOCIN) IVPB 1000 mg/200 mL premix 1,000 mg 200 mL/hr   10/17/16 0349 Given   meropenem (MERREM) IVPB SOLR 1 g 1 g 100 mL/hr   10/17/16 0837 Given   meropenem (MERREM) IVPB SOLR 1 g 1 g 100 mL/hr   10/17/16 1541 Given   meropenem (MERREM) IVPB SOLR 1 g 1 g 100 mL/hr   10/17/16 1726 Given   vancomycin (VANCOCIN) IVPB 1000 mg/200 mL premix 1,000 mg 200 mL/hr   10/17/16 2347 Given   meropenem (MERREM) IVPB SOLR 1 g 1 g 100 mL/hr   10/18/16 0932 Given   meropenem (MERREM) IVPB SOLR 1 g 1 g 100 mL/hr   10/18/16 0932 Given   vancomycin (VANCOCIN)  IVPB 1000 mg/200 mL premix 1,000 mg 200 mL/hr      MEDICATIONS: . insulin aspart  0-5 Units Subcutaneous QHS  . insulin aspart  0-9 Units Subcutaneous TID WC  . lisinopril  10 mg Oral Daily  . meropenem  1 g Intravenous Q8H  . metFORMIN  500 mg Oral BID WC  . sodium chloride  1,000 mL Intravenous Once  . vancomycin  1,000 mg Intravenous Q12H    Review of Systems - 11 systems reviewed and negative per HPI   OBJECTIVE: Temp:  [97.8 F (36.6 C)-98 F (36.7 C)] 98 F (36.7 C) (03/26 0800) Pulse Rate:  [71-81] 71 (03/26 0800) Resp:  [18] 18 (03/26 0800) BP: (101-128)/(63-69) 101/69 (03/26 0800) SpO2:  [94 %-96 %] 96 % (03/26 0800) Physical Exam  Constitutional:  oriented to person, place, and time. appears well-developed and well-nourished. No distress.  HENT: [place]/AT, PERRLA, no scleral icterus Mouth/Throat:  Oropharynx is clear and moist. No oropharyngeal exudate.  Cardiovascular: Normal rate, regular rhythm and normal heart sounds. Exam reveals no gallop and no friction rub.  No murmur heard.  Pulmonary/Chest: Effort normal and breath sounds normal. No respiratory distress.  has no wheezes.  Neck = supple, no nuchal rigidity Abdominal: Soft. Bowel sounds are normal.  exhibits no distension. There is no tenderness.  Lymphadenopathy: no cervical adenopathy. No axillary adenopathy Ext LLE with 2+ edema to mid shin Neurological: alert and oriented to person, place, and time.  Skin: L lateral foot with dorsal incision over 5th MT region, sutures in place but some gaping open. Incision extends to plantar surface. No active purulence but significant erythema extending to dorsum of foot  Psychiatric: a normal mood and affect.  behavior is normal.    LABS: Results for orders placed or performed during the hospital encounter of 10/15/16 (from the past 48 hour(s))  Glucose, capillary     Status: Abnormal   Collection Time: 10/16/16  5:09 PM  Result Value Ref Range   Glucose-Capillary 196 (H) 65 - 99 mg/dL  Glucose, capillary     Status: Abnormal   Collection Time: 10/16/16  9:04 PM  Result Value Ref Range   Glucose-Capillary 118 (H) 65 - 99 mg/dL   Comment 1 Notify RN   CBC     Status: Abnormal   Collection Time: 10/17/16  3:27 AM  Result Value Ref Range   WBC 13.1 (H) 3.6 - 11.0 K/uL   RBC 3.90 3.80 - 5.20 MIL/uL   Hemoglobin 11.6 (L) 12.0 - 16.0 g/dL   HCT 34.5 (L) 35.0 - 47.0 %   MCV 88.4 80.0 - 100.0 fL   MCH 29.7 26.0 - 34.0 pg   MCHC 33.6 32.0 - 36.0 g/dL   RDW 13.4 11.5 - 14.5 %   Platelets 332 150 - 440 K/uL  Basic metabolic panel     Status: Abnormal   Collection Time: 10/17/16  3:27 AM  Result Value Ref Range   Sodium 135 135 - 145 mmol/L   Potassium 3.6 3.5 - 5.1 mmol/L   Chloride 105 101 - 111 mmol/L   CO2 25 22 - 32 mmol/L   Glucose, Bld 148 (H) 65 - 99 mg/dL   BUN 11 6 -  20 mg/dL   Creatinine, Ser 0.65 0.44 - 1.00 mg/dL   Calcium 8.2 (L) 8.9 - 10.3 mg/dL   GFR calc non Af Amer >60 >60 mL/min   GFR calc Af Amer >60 >60 mL/min    Comment: (NOTE) The  eGFR has been calculated using the CKD EPI equation. This calculation has not been validated in all clinical situations. eGFR's persistently <60 mL/min signify possible Chronic Kidney Disease.    Anion gap 5 5 - 15  Glucose, capillary     Status: Abnormal   Collection Time: 10/17/16  7:25 AM  Result Value Ref Range   Glucose-Capillary 133 (H) 65 - 99 mg/dL   Comment 1 Notify RN   Glucose, capillary     Status: Abnormal   Collection Time: 10/17/16 11:10 AM  Result Value Ref Range   Glucose-Capillary 176 (H) 65 - 99 mg/dL   Comment 1 Notify RN   Glucose, capillary     Status: None   Collection Time: 10/17/16  4:43 PM  Result Value Ref Range   Glucose-Capillary 99 65 - 99 mg/dL   Comment 1 Notify RN   Glucose, capillary     Status: Abnormal   Collection Time: 10/17/16  9:08 PM  Result Value Ref Range   Glucose-Capillary 123 (H) 65 - 99 mg/dL   Comment 1 Notify RN   CBC     Status: Abnormal   Collection Time: 10/18/16  3:09 AM  Result Value Ref Range   WBC 11.2 (H) 3.6 - 11.0 K/uL   RBC 3.99 3.80 - 5.20 MIL/uL   Hemoglobin 11.8 (L) 12.0 - 16.0 g/dL   HCT 35.4 35.0 - 47.0 %   MCV 88.7 80.0 - 100.0 fL   MCH 29.7 26.0 - 34.0 pg   MCHC 33.5 32.0 - 36.0 g/dL   RDW 13.6 11.5 - 14.5 %   Platelets 374 150 - 440 K/uL  Vancomycin, trough     Status: Abnormal   Collection Time: 10/18/16  9:20 AM  Result Value Ref Range   Vancomycin Tr 9 (L) 15 - 20 ug/mL  Glucose, capillary     Status: Abnormal   Collection Time: 10/18/16 11:19 AM  Result Value Ref Range   Glucose-Capillary 157 (H) 65 - 99 mg/dL   No components found for: ESR, C REACTIVE PROTEIN MICRO: Recent Results (from the past 720 hour(s))  Culture, blood (routine x 2)     Status: None (Preliminary result)   Collection Time: 10/15/16  7:08 PM   Result Value Ref Range Status   Specimen Description BLOOD RIGHT FOREARM  Final   Special Requests   Final    BOTTLES DRAWN AEROBIC AND ANAEROBIC Blood Culture adequate volume   Culture NO GROWTH 3 DAYS  Final   Report Status PENDING  Incomplete  Culture, blood (routine x 2)     Status: None (Preliminary result)   Collection Time: 10/15/16  7:37 PM  Result Value Ref Range Status   Specimen Description BLOOD RIGHT ANTECUBITAL  Final   Special Requests   Final    BOTTLES DRAWN AEROBIC AND ANAEROBIC Blood Culture adequate volume   Culture NO GROWTH 3 DAYS  Final   Report Status PENDING  Incomplete  Aerobic/Anaerobic Culture (surgical/deep wound)     Status: None (Preliminary result)   Collection Time: 10/16/16 12:00 PM  Result Value Ref Range Status   Specimen Description WOUND  Final   Special Requests NONE  Final   Gram Stain   Final    MODERATE WBC PRESENT, PREDOMINANTLY PMN MODERATE GRAM POSITIVE COCCI IN PAIRS FEW GRAM POSITIVE COCCI IN CHAINS    Culture   Final    CULTURE REINCUBATED FOR BETTER GROWTH Performed at [facility], [place]. [place],  [place] [address]    Report Status PENDING  Incomplete  Aerobic/Anaerobic Culture (surgical/deep wound)     Status: None (Preliminary result)   Collection Time: 10/16/16 12:00 PM  Result Value Ref Range Status   Specimen Description TISSUE  Final   Special Requests LEFT 5TH MTP POF ROCEPHIN VANC AND MEROPENEM  Final   Gram Stain   Final    RARE WBC PRESENT, PREDOMINANTLY PMN MODERATE GRAM POSITIVE COCCI IN PAIRS FEW GRAM POSITIVE COCCI IN CHAINS    Culture   Final    CULTURE REINCUBATED FOR BETTER GROWTH Performed at [facility], [place] [address], [place], [place] [address]    Report Status PENDING  Incomplete    IMAGING: Dg Foot Complete Left  Result Date: 10/15/2016 CLINICAL DATA:  LEFT foot pain with redness.  History of diabetes. EXAM: LEFT FOOT - COMPLETE 3+ VIEW COMPARISON:  None. FINDINGS: There  is air in the soft tissues lateral to the fifth MTP joint, and surrounding the MTP joint. No radiopaque foreign body is seen. There is marked soft tissue swelling. No osseous erosion is seen. Osteopenia is noted. IMPRESSION: Air in the soft tissues adjacent to, and surrounding, the fifth MTP joint. No fracture or osseous abnormality. Electronically Signed   By: Staci Righter M.D.   On: 10/15/2016 18:49    Assessment:   VERLIA KANEY is a 71 y.o. female with foot ulcer leading to cellulitis and abscess with involvement down to bone, s/p debridement 3/24 by Dr Elvina Mattes. Cultures pending. ESR 43, crp 11.0.   Given extent of infection and involvement down to bone would benefit from IV abx for 2-4 weeks.  Discussed this with patient, her husband and Dr Elvina Mattes.   Recommendations Place PICC line IV vancomycin for now and can use cefepime (if psueudomonas) or ceftriaxone (if no pseduomonas). Flagyl for anaerobes Will adjust abx based on culture but will plan for now on vanco and cefepime and oral flagyl ABX order sheet completed Thank you very much for allowing me to participate in the care of this patient. Please call with questions.   Cheral Marker. Ola Spurr, MD

## 2016-10-18 NOTE — Progress Notes (Signed)
Called made to [facility] for PICC line placement. Spoke with Ginger and will callback with an estimated time of arrival.

## 2016-10-18 NOTE — Progress Notes (Signed)
Patient Demographics  Joann [name], is a 71 y.o. female   MRN: [id number]   DOB - 04-26-1946  Admit Date - 10/15/2016    Outpatient Primary MD for the patient is Leonel Ramsay, MD  Consult requested in the Hospital by Hillary Bow, MD, On 10/18/2016   With History of -  Past Medical History:  Diagnosis Date  . Breast cancer ([place]) 2007   right breast lumpectomy with 36 rad tx  . Diabetes mellitus without complication ([place])   . Hypertension       Past Surgical History:  Procedure Laterality Date  . BREAST BIOPSY Left 2000   benign  . BREAST BIOPSY Right 2007   positive.  Lumpectomy with rad tx  . INCISION AND DRAINAGE Left 10/16/2016   Procedure: INCISION AND DRAINAGE;  Surgeon: Albertine Patricia, DPM;  Location: [facility] ORS;  Service: Podiatry;  Laterality: Left;    in for   Chief Complaint  Patient presents with  . Foot Pain     HPI  Joann [name]  is a 71 y.o. female, 2 days status post incision and drainage of infected left foot with resection of bone from the proximal phalanx base and fifth metatarsal head to the left fifth MTPJ.    Review of Systems    In addition to the HPI above,  No Fever-chills, No Headache, No changes with Vision or hearing, No problems swallowing food or Liquids, No Chest pain, Cough or Shortness of Breath, No Abdominal pain, No Nausea or Vommitting, Bowel movements are regular, No Blood in stool or Urine, No dysuria, No new skin rashes or bruises, No new joints pains-aches,  No new weakness, tingling, numbness in any extremity, No recent weight gain or loss, No polyuria, polydypsia or polyphagia, No significant Mental Stressors.  A full 10 point Review of Systems was done, except as stated above, all other Review of Systems were negative.   Social  History Social History  Substance Use Topics  . Smoking status: Never Smoker  . Smokeless tobacco: Never Used  . Alcohol use No     Family History Family History  Problem Relation Age of Onset  . Dementia Mother   . CAD Mother   . CAD Father   . Deep vein thrombosis Father      Prior to Admission medications   Medication Sig Start Date End Date Taking? Authorizing Provider  hydrochlorothiazide (HYDRODIURIL) 12.5 MG tablet Take 12.5 mg by mouth every evening. 10/12/16  Yes Historical Provider, MD  lisinopril (PRINIVIL,ZESTRIL) 10 MG tablet Take 10 mg by mouth daily. 07/30/16  Yes Historical Provider, MD  metFORMIN (GLUCOPHAGE) 500 MG tablet Take 500 mg by mouth 2 (two) times daily. 09/30/16  Yes Historical Provider, MD    Anti-infectives    Start     Dose/Rate Route Frequency Ordered Stop   10/18/16 2200  vancomycin (VANCOCIN) IVPB 1000 mg/200 mL premix     1,000 mg 200 mL/hr over 60 Minutes Intravenous Every 12 hours 10/18/16 1018     10/16/16 0400  vancomycin (VANCOCIN) IVPB 1000 mg/200 mL premix  Status:  Discontinued     1,000 mg 200 mL/hr over 60 Minutes Intravenous Every 18  hours 10/15/16 2235 10/18/16 1018   10/16/16 0300  vancomycin (VANCOCIN) IVPB 1000 mg/200 mL premix  Status:  Discontinued     1,000 mg 200 mL/hr over 60 Minutes Intravenous Every 18 hours 10/15/16 2229 10/15/16 2235   10/16/16 0000  meropenem (MERREM) 1 g in sodium chloride 0.9 % 100 mL IVPB  Status:  Discontinued     1 g 200 mL/hr over 30 Minutes Intravenous Every 8 hours 10/15/16 2205 10/15/16 2207   10/16/16 0000  meropenem (MERREM) IVPB SOLR 1 g     1 g 100 mL/hr over 30 Minutes Intravenous Every 8 hours 10/15/16 2208     10/15/16 1900  vancomycin (VANCOCIN) IVPB 1000 mg/200 mL premix     1,000 mg 200 mL/hr over 60 Minutes Intravenous  Once 10/15/16 1845 10/15/16 2150   10/15/16 1900  cefTRIAXone (ROCEPHIN) IVPB 1 g     1 g 100 mL/hr over 30 Minutes Intravenous  Once 10/15/16 1846 10/15/16 2011    10/15/16 1845  cefTRIAXone (ROCEPHIN) 1 g in dextrose 5 % 50 mL IVPB  Status:  Discontinued     1 g 100 mL/hr over 30 Minutes Intravenous  Once 10/15/16 1845 10/15/16 1845      Scheduled Meds: . insulin aspart  0-5 Units Subcutaneous QHS  . insulin aspart  0-9 Units Subcutaneous TID WC  . lisinopril  10 mg Oral Daily  . meropenem  1 g Intravenous Q8H  . metFORMIN  500 mg Oral BID WC  . sodium chloride  1,000 mL Intravenous Once  . vancomycin  1,000 mg Intravenous Q12H   Continuous Infusions: PRN Meds:.acetaminophen **OR** acetaminophen, oxyCODONE  Allergies  Allergen Reactions  . Neosporin [Neomycin-Bacitracin Zn-Polymyx]     "it just don't agree with me"   . Penicillins Itching    Has patient had a PCN reaction causing immediate rash, facial/tongue/throat swelling, SOB or lightheadedness with hypotension: no Has patient had a PCN reaction causing severe rash involving mucus membranes or skin necrosis: no Has patient had a PCN reaction that required hospitalization no Has patient had a PCN reaction occurring within the last 10 years:no If all of the above answers are "NO", then may proceed with Cephalosporin use.   . Statins     hallucinations    Physical Exam  Vitals  Blood pressure 101/69, pulse 71, temperature 98 F (36.7 C), temperature source Oral, resp. rate 18, height 5\' 3"  (1.6 m), weight 88.5 kg (195 lb 1.6 oz), SpO2 96 %.  Lower Extremity exam:Packing and dressing removed today. Cellulitis and redness to the foot and ankle are significantly improved patient still has good bit or redness around the lateral foot closer to the site of infection however. Packing removed and mostly serous drainage on that. No pain or discomfort at this point. White count is reduced down to 11.2 from originally 21.9.   Data Review  CBC  Recent Labs Lab 10/15/16 1733 10/16/16 0324 10/17/16 0327 10/18/16 0309  WBC 21.9* 17.9* 13.1* 11.2*  HGB 13.6 12.4 11.6* 11.8*  HCT 40.9  37.2 34.5* 35.4  PLT 457* 340 332 374  MCV 86.3 87.3 88.4 88.7  MCH 28.7 29.2 29.7 29.7  MCHC 33.3 33.4 33.6 33.5  RDW 13.5 13.3 13.4 13.6  LYMPHSABS 2.7  --   --   --   MONOABS 1.9*  --   --   --   EOSABS 0.1  --   --   --   BASOSABS 0.1  --   --   --    ------------------------------------------------------------------------------------------------------------------  Chemistries   Recent Labs Lab 10/15/16 1733 10/16/16 0324 10/17/16 0327  NA 134* 134* 135  K 3.4* 3.6 3.6  CL 99* 101 105  CO2 25 25 25   GLUCOSE 133* 149* 148*  BUN 18 16 11   CREATININE 0.94 0.80 0.65  CALCIUM 9.2 8.4* 8.2*   ------------------------------------------------------------------------------------------------------------------ Assessment & Plan: Patient is doing better at this point has improved quite a bit but still needs intravenous antibiotics. I think the bone may have been involved with early infection so I think at least 2 weeks of IV antibiotics would be helpful. She is scheduled to have a consult with infectious disease today. She's had physical therapy and done fairly well with that. Upon discharge she'll need home health to set her up for daily wet-to-dry dressing changes across the region. She'll also be a PICC line placement for she leaves. This will be up to infectious disease of course. Currently recommend still only bathroom privileges. When she does go home dressing changes should be a wet-to-dry gauze dressing with heavy Kerlix wrap and always use the OrthoWedge shoe when standing or walking.  Active Problems:   Cellulitis   Family Communication: Plan discussed with patient and family  Perry Mount M.D on 10/18/2016 at 12:14 PM  Thank you for the consult, we will follow the patient with you in the Hospital.

## 2016-10-18 NOTE — Progress Notes (Addendum)
Pharmacy Antibiotic Note  Joann [name] is a 71 y.o. female admitted on 10/15/2016 with OM.  Pharmacy has been consulted for cefepime dosing.  Patient is also prescribed metronidazole and vancomycin. Meropenem being discontinued and cefepime being started today.  Plan: Order cefepime 2 g IV q12h  Height: 5\' 3"  (160 cm) Weight: 195 lb 1.6 oz (88.5 kg) IBW/kg (Calculated) : 52.4  Temp (24hrs), Avg:97.9 F (36.6 C), Joann:97.8 F (36.6 C), Max:98 F (36.7 C)   Recent Labs Lab 10/15/16 1733 10/15/16 1908 10/15/16 2208 10/16/16 0324 10/17/16 0327 10/18/16 0309 10/18/16 0920  WBC 21.9*  --   --  17.9* 13.1* 11.2*  --   CREATININE 0.94  --   --  0.80 0.65  --   --   LATICACIDVEN  --  3.0* 3.2*  --   --   --   --   VANCOTROUGH  --   --   --   --   --   --  9*    Estimated Creatinine Clearance: 67 mL/Joann (by C-G formula based on SCr of 0.65 mg/dL).    Allergies  Allergen Reactions  . Neosporin [Neomycin-Bacitracin Zn-Polymyx]     "it just don't agree with me"   . Penicillins Itching    Has patient had a PCN reaction causing immediate rash, facial/tongue/throat swelling, SOB or lightheadedness with hypotension: no Has patient had a PCN reaction causing severe rash involving mucus membranes or skin necrosis: no Has patient had a PCN reaction that required hospitalization no Has patient had a PCN reaction occurring within the last 10 years:no If all of the above answers are "NO", then may proceed with Cephalosporin use.   . Statins     hallucinations    Thank you for allowing pharmacy to be a part of this patient's care.  Lenis Noon, PharmD 10/18/2016 3:29 PM

## 2016-10-18 NOTE — Progress Notes (Signed)
Pharmacy Antibiotic Note  Joann [name] is a 71 y.o. female admitted on 10/15/2016 with sepsis/likely osteomyelitis.  Pharmacy has been consulted for vancomycin and meropenem dosing.  Plan: Trough resulted at 9. Goal trough 15-20. Will change interval to 1g q 12 hours. Recheck trough on 3/28 @ 0930  Continue Meropenem 1 gram q 8 hours   Height: 5\' 3"  (160 cm) Weight: 195 lb 1.6 oz (88.5 kg) IBW/kg (Calculated) : 52.4  Temp (24hrs), Avg:98.3 F (36.8 C), Min:97.8 F (36.6 C), Max:99 F (37.2 C)   Recent Labs Lab 10/15/16 1733 10/15/16 1908 10/15/16 2208 10/16/16 0324 10/17/16 0327 10/18/16 0309 10/18/16 0920  WBC 21.9*  --   --  17.9* 13.1* 11.2*  --   CREATININE 0.94  --   --  0.80 0.65  --   --   LATICACIDVEN  --  3.0* 3.2*  --   --   --   --   VANCOTROUGH  --   --   --   --   --   --  9*    Estimated Creatinine Clearance: 67 mL/min (by C-G formula based on SCr of 0.65 mg/dL).    Allergies  Allergen Reactions  . Neosporin [Neomycin-Bacitracin Zn-Polymyx]     "it just don't agree with me"   . Penicillins Itching    Has patient had a PCN reaction causing immediate rash, facial/tongue/throat swelling, SOB or lightheadedness with hypotension: no Has patient had a PCN reaction causing severe rash involving mucus membranes or skin necrosis: no Has patient had a PCN reaction that required hospitalization no Has patient had a PCN reaction occurring within the last 10 years:no If all of the above answers are "NO", then may proceed with Cephalosporin use.   . Statins     hallucinations    Antimicrobials this admission: Vancomycin, meropenem 3/23  >>    >>   Dose adjustments this admission: vanc 1g q 18 >> vanc 1g q 12hr  Microbiology results: 3/23 BCx: NG 3 days 3/24 wound cx: pending   Thank you for allowing pharmacy to be a part of this patient's care.  Ramond Dial, Pharm.D, BCPS Clinical Pharmacist  10/18/2016 10:20 AM

## 2016-10-19 LAB — GLUCOSE, CAPILLARY
GLUCOSE-CAPILLARY: 104 mg/dL — AB (ref 65–99)
Glucose-Capillary: 110 mg/dL — ABNORMAL HIGH (ref 65–99)

## 2016-10-19 NOTE — Discharge Instructions (Addendum)
Dressing changes should be a wet-to-dry gauze dressing with heavy Kerlix wrap  Always use the OrthoWedge shoe when standing or walking.  Resume diet as before

## 2016-10-19 NOTE — Progress Notes (Signed)
Pt being discharged home today. PIV was removed, PICC remains in place. Discharge instructions reviewed with pt all questions answered. Prescriptions in place for home antibiotics. Educated pt and husband on daily dressing changes and he participated in the dressing changed with me prior to discharge, will have Home health RN as well for reinforcement. She has follow up scheduled with Dr Elvina Mattes. She is leaving with all her belongings, will be transported home via husband.

## 2016-10-19 NOTE — Care Management Note (Signed)
Case Management Note  Patient Details  Name: Joann [name] MRN: [id number] Date of Birth: [birth year]  Subjective/Objective: Met with patient and spouse at bedside. Patient lives with spouse and she is usually independent with adls. She does have a walker if needed.  [facility] to provide home IV antibiotics. [facility] notified of next vancomycin and cefeime doses. Currently 9a and 9 pm. Notified Jason with [facility] of daily dressing change orders per Dr. Elvina Mattes. Patient agreeable to POC.                Action/Plan: Discharging today with IV abx and daily dressing changes. [facility] aware of POC.   Expected Discharge Date:  10/18/16               Expected Discharge Plan:  [place]  In-House Referral:     Discharge planning Services  CM Consult  Post Acute Care Choice:  Home Health Choice offered to:  Patient  DME Arranged:    DME Agency:  [facility] Arranged:  RN, IV Antibiotics HH Agency:  [facility]  Status of Service:  Completed, signed off  If discussed at [place] of Stay Meetings, dates discussed:    Additional Comments:  [name], RN 10/19/2016, 10:52 AM

## 2016-10-20 LAB — CULTURE, BLOOD (ROUTINE X 2)
Culture: NO GROWTH
Culture: NO GROWTH
SPECIAL REQUESTS: ADEQUATE
Special Requests: ADEQUATE

## 2016-10-20 NOTE — Discharge Summary (Signed)
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 Breast cancer ([place]) 2007   right breast lumpectomy with 36 rad tx  . Diabetes mellitus without complication ([place])   . Hypertension      ADMITTING HISTORY  HISTORY OF PRESENT ILLNESS:  Joann [name]  is a 71 y.o. female with a known history Of diabetes and hypertension. She presents with foot pain. Yesterday she started developing pain in her left foot 3 out of 10 intensity. It became swollen and red this a.m. She thought she had a piece of glass in her foot. She does not recall how long the ulcer is been on the bottom of her foot. No complaints of fever. In the ER she was found to have an elevated white blood cell count and lactic acidosis. X-ray showing air in the soft tissues adjacent in the surrounding the fifth MTP joint. Hospitalist services were contacted for further evaluation.   HOSPITAL COURSE:   # diabetic infection of the left the fifth  Toe wiith gas gangrene Status post drainage of deep abscess of left fifth metatarsophalangeal joint by podiatry with resection of infected necrotic tissue Resection of the head of the fifth metatarsal, Base of proximal phalanx of the fifth toe.  Wound cultures are growing streptococcus anginosis PICC line placed Discussed with Dr. Ola Spurr of infectious disease was on the patient. Patient will be discharged home on IV cefepime, IV vancomycin and oral Flagyl. These will be changed by Dr.  Ola Spurr as outpatient once cultures finalize. She will have wet-to-dry dressing to be changed daily by Dr. Elvina Mattes of podiatry.  * Diabetes mellitus type2. Resume home medications  * Essential hypertension: Continue lisinopril   Stable for discharge home.  CONSULTS OBTAINED:  Treatment Team:  Leonel Ramsay, MD  DRUG ALLERGIES:   Allergies  Allergen Reactions  . Neosporin [Neomycin-Bacitracin Zn-Polymyx]     "it just don't agree with me"   . Penicillins Itching    Has patient had a PCN reaction causing immediate rash, facial/tongue/throat swelling, SOB or lightheadedness with hypotension: no Has patient had a PCN reaction causing severe rash involving mucus membranes or skin necrosis: no Has patient had a PCN reaction that required hospitalization no Has patient had a PCN reaction occurring within the last 10 years:no If all of the above answers are "NO", then may proceed with Cephalosporin use.   . Statins     hallucinations    DISCHARGE MEDICATIONS:   Discharge Medication List as of 10/19/2016 12:46 PM    CONTINUE these medications which have NOT CHANGED   Details  hydrochlorothiazide (HYDRODIURIL) 12.5 MG tablet Take 12.5 mg by mouth every evening., Starting Tue 10/12/2016, Historical Med    lisinopril (PRINIVIL,ZESTRIL) 10 MG tablet Take 10 mg by mouth daily., Starting Fri 07/30/2016, Historical Med    metFORMIN (GLUCOPHAGE) 500 MG tablet Take 500 mg by mouth 2 (two) times daily., Starting Thu 09/30/2016, Historical Med        Today  VITAL SIGNS:  Blood pressure 137/61, pulse 66, temperature 97.9 F (36.6 C), temperature source Oral, resp. rate 18, height 5\' 3"  (1.6 m), weight 88.5 kg (195 lb 1.6 oz), SpO2 99 %.  I/O:  No intake or output data in the 24 hours ending 10/20/16 1922  PHYSICAL EXAMINATION:  Physical Exam  GENERAL:  71 y.o.-year-old patient lying in the bed with no acute distress.  LUNGS: Normal breath sounds bilaterally, no wheezing,  rales,rhonchi or crepitation. No use of accessory muscles of respiration.  CARDIOVASCULAR: S1, S2 normal. No murmurs, rubs, or gallops.  ABDOMEN: Soft, non-tender, non-distended. Bowel sounds present. No organomegaly or mass.  NEUROLOGIC: Moves all 4 extremities. PSYCHIATRIC: The patient is alert and oriented x 3.   Left foot dressing.  Right upper extremity PICC line  DATA REVIEW:   CBC  Recent Labs Lab 10/18/16 0309  WBC 11.2*  HGB 11.8*  HCT 35.4  PLT 374    Chemistries   Recent Labs Lab 10/17/16 0327  NA 135  K 3.6  CL 105  CO2 25  GLUCOSE 148*  BUN 11  CREATININE 0.65  CALCIUM 8.2*    Cardiac Enzymes No results for input(s): TROPONINI in the last 168 hours.  Microbiology Results  Results for orders placed or performed during the hospital encounter of 10/15/16  Culture, blood (routine x 2)     Status: None   Collection Time: 10/15/16  7:08 PM  Result Value Ref Range Status   Specimen Description BLOOD RIGHT FOREARM  Final   Special Requests   Final    BOTTLES DRAWN AEROBIC AND ANAEROBIC Blood Culture adequate volume   Culture NO GROWTH 5 DAYS  Final   Report Status 10/20/2016 FINAL  Final  Culture, blood (routine x 2)     Status: None   Collection Time: 10/15/16  7:37 PM  Result Value Ref Range Status   Specimen Description BLOOD RIGHT ANTECUBITAL  Final   Special Requests   Final    BOTTLES DRAWN AEROBIC AND ANAEROBIC Blood Culture adequate volume   Culture NO GROWTH 5 DAYS  Final   Report Status 10/20/2016 FINAL  Final  Aerobic/Anaerobic Culture (surgical/deep wound)     Status: None (Preliminary result)   Collection Time: 10/16/16 12:00 PM  Result Value Ref Range Status   Specimen Description WOUND  Final   Special Requests NONE  Final   Gram Stain   Final    MODERATE WBC PRESENT, PREDOMINANTLY PMN MODERATE GRAM POSITIVE COCCI IN PAIRS FEW GRAM POSITIVE COCCI IN CHAINS    Culture   Final    ABUNDANT STREPTOCOCCUS ANGINOSIS SUSCEPTIBILITIES  PERFORMED ON PREVIOUS CULTURE WITHIN THE LAST 5 DAYS. HOLDING FOR POSSIBLE ANAEROBE Performed at [facility], [place] [address], [place], [place] [address]    Report Status PENDING  Incomplete  Aerobic/Anaerobic Culture (surgical/deep wound)     Status: None (Preliminary result)   Collection Time: 10/16/16 12:00 PM  Result Value Ref Range Status   Specimen Description TISSUE  Final   Special Requests LEFT 5TH MTP POF ROCEPHIN VANC AND MEROPENEM  Final   Gram Stain   Final    RARE WBC PRESENT, PREDOMINANTLY PMN MODERATE GRAM POSITIVE COCCI IN PAIRS FEW GRAM POSITIVE COCCI IN CHAINS    Culture   Final    ABUNDANT STREPTOCOCCUS ANGINOSIS HOLDING FOR POSSIBLE ANAEROBE Performed at [facility], [place] [address], [place], [place] [address]    Report Status PENDING  Incomplete   Organism ID,  Bacteria STREPTOCOCCUS ANGINOSIS  Final      Susceptibility   Streptococcus anginosis - MIC*    PENICILLIN <=0.06 SENSITIVE Sensitive     CEFTRIAXONE 0.25 SENSITIVE Sensitive     ERYTHROMYCIN <=0.12 SENSITIVE Sensitive     LEVOFLOXACIN 1 SENSITIVE Sensitive     VANCOMYCIN 0.5 SENSITIVE Sensitive     * ABUNDANT STREPTOCOCCUS ANGINOSIS    RADIOLOGY:  No results found.  Follow up with PCP in 1 week.  Management plans discussed with the patient, family and they are in agreement.  CODE STATUS:  Code Status History    Date Active Date Inactive Code Status Order ID Comments User Context   10/15/2016  9:07 PM 10/19/2016  4:24 PM DNR [id number]  Loletha Grayer, MD ED    Questions for Most Recent Historical Code Status (Order [id number])    Question Answer Comment   In the event of cardiac or respiratory ARREST Do not call a "code blue"    In the event of cardiac or respiratory ARREST Do not perform Intubation, CPR, defibrillation or ACLS    In the event of cardiac or respiratory ARREST Use medication by any route, position, wound care, and other measures to relive pain and suffering. May use  oxygen, suction and manual treatment of airway obstruction as needed for comfort.    Comments nurse may pronounce         Advance Directive Documentation     Most Recent Value  Type of Advance Directive  Healthcare Power of Attorney, Living will  Pre-existing out of facility DNR order (yellow form or pink MOST form)  -  "MOST" Form in Place?  -      TOTAL TIME TAKING CARE OF THIS PATIENT ON DAY OF DISCHARGE: more than 30 minutes.   Hillary Bow R M.D on 10/20/2016 at 7:22 PM  Between 7am to 6pm - Pager - [phone]  After 6pm go to [email or web address] - password [email or web address] [facility]  Office  [phone]  CC: Primary care physician; FITZGERALD, DAVID Mamie Nick, MD  Note: This dictation was prepared with Dragon dictation along with smaller phrase technology. Any transcriptional errors that result from this process are unintentional.

## 2016-10-21 ENCOUNTER — Other Ambulatory Visit (HOSPITAL_COMMUNITY)
Admission: RE | Admit: 2016-10-21 | Discharge: 2016-10-21 | Disposition: A | Discharge status: Home / Self Care | Payer: Medicare Other | Source: Ambulatory Visit | Attending: Infectious Diseases | Admitting: Infectious Diseases

## 2016-10-21 DIAGNOSIS — Z792 Long term (current) use of antibiotics: Secondary | ICD-10-CM | POA: Insufficient documentation

## 2016-10-21 LAB — BASIC METABOLIC PANEL
Anion gap: 8 (ref 5–15)
BUN: 14 mg/dL (ref 6–20)
CHLORIDE: 102 mmol/L (ref 101–111)
CO2: 26 mmol/L (ref 22–32)
CREATININE: 0.74 mg/dL (ref 0.44–1.00)
Calcium: 8.8 mg/dL — ABNORMAL LOW (ref 8.9–10.3)
GFR calc Af Amer: >60 mL/min (ref >60)
GFR calc non Af Amer: >60 mL/min (ref >60)
GLUCOSE: 88 mg/dL (ref 65–99)
POTASSIUM: 3.5 mmol/L (ref 3.5–5.1)
SODIUM: 136 mmol/L (ref 135–145)

## 2016-10-21 LAB — AEROBIC/ANAEROBIC CULTURE (SURGICAL/DEEP WOUND)

## 2016-10-21 LAB — AEROBIC/ANAEROBIC CULTURE W GRAM STAIN (SURGICAL/DEEP WOUND)

## 2016-10-25 ENCOUNTER — Other Ambulatory Visit (HOSPITAL_COMMUNITY)
Admission: RE | Admit: 2016-10-25 | Discharge: 2016-10-25 | Disposition: A | Discharge status: Home / Self Care | Payer: Medicare Other | Source: Other Acute Inpatient Hospital | Attending: Infectious Diseases | Admitting: Infectious Diseases

## 2016-10-25 DIAGNOSIS — Z792 Long term (current) use of antibiotics: Secondary | ICD-10-CM | POA: Diagnosis present

## 2016-10-25 DIAGNOSIS — Z5181 Encounter for therapeutic drug level monitoring: Secondary | ICD-10-CM | POA: Insufficient documentation

## 2016-10-25 LAB — CBC WITH DIFFERENTIAL/PLATELET
BASOS PCT: 0 %
Basophils Absolute: 0 10*3/uL (ref 0.0–0.1)
EOS ABS: 0.5 10*3/uL (ref 0.0–0.7)
Eosinophils Relative: 6 %
HEMATOCRIT: 38.4 % (ref 36.0–46.0)
Hemoglobin: 12.4 g/dL (ref 12.0–15.0)
LYMPHS ABS: 1.7 10*3/uL (ref 0.7–4.0)
Lymphocytes Relative: 20 %
MCH: 29.5 pg (ref 26.0–34.0)
MCHC: 32.3 g/dL (ref 30.0–36.0)
MCV: 91.2 fL (ref 78.0–100.0)
MONO ABS: 0.9 10*3/uL (ref 0.1–1.0)
MONOS PCT: 11 %
NEUTROS ABS: 5.3 10*3/uL (ref 1.7–7.7)
Neutrophils Relative %: 63 %
Platelets: 388 10*3/uL (ref 150–400)
RBC: 4.21 MIL/uL (ref 3.87–5.11)
RDW: 13.5 % (ref 11.5–15.5)
WBC: 8.4 10*3/uL (ref 4.0–10.5)

## 2016-10-25 LAB — COMPREHENSIVE METABOLIC PANEL
ALBUMIN: 3.3 g/dL — AB (ref 3.5–5.0)
ALK PHOS: 95 U/L (ref 38–126)
ALT: 26 U/L (ref 14–54)
AST: 24 U/L (ref 15–41)
Anion gap: 10 (ref 5–15)
BUN: 12 mg/dL (ref 6–20)
CALCIUM: 9 mg/dL (ref 8.9–10.3)
CHLORIDE: 98 mmol/L — AB (ref 101–111)
CO2: 28 mmol/L (ref 22–32)
CREATININE: 0.71 mg/dL (ref 0.44–1.00)
GFR calc Af Amer: >60 mL/min (ref >60)
GFR calc non Af Amer: >60 mL/min (ref >60)
GLUCOSE: 83 mg/dL (ref 65–99)
Potassium: 3.7 mmol/L (ref 3.5–5.1)
SODIUM: 136 mmol/L (ref 135–145)
Total Bilirubin: 0.8 mg/dL (ref 0.3–1.2)
Total Protein: 7.1 g/dL (ref 6.5–8.1)

## 2016-10-25 LAB — C-REACTIVE PROTEIN: CRP: <0.8 mg/dL (ref <1.0)

## 2016-10-25 LAB — VANCOMYCIN, TROUGH: Vancomycin Tr: 14 ug/mL — ABNORMAL LOW (ref 15–20)

## 2016-11-01 ENCOUNTER — Other Ambulatory Visit (HOSPITAL_COMMUNITY)
Admission: RE | Admit: 2016-11-01 | Discharge: 2016-11-01 | Disposition: A | Discharge status: Home / Self Care | Payer: Medicare Other | Source: Other Acute Inpatient Hospital | Attending: Infectious Diseases | Admitting: Infectious Diseases

## 2016-11-01 DIAGNOSIS — Z5181 Encounter for therapeutic drug level monitoring: Secondary | ICD-10-CM | POA: Diagnosis present

## 2016-11-01 DIAGNOSIS — Z792 Long term (current) use of antibiotics: Secondary | ICD-10-CM | POA: Insufficient documentation

## 2016-11-01 LAB — CBC WITH DIFFERENTIAL/PLATELET
BASOS PCT: 1 %
Basophils Absolute: 0 10*3/uL (ref 0.0–0.1)
Eosinophils Absolute: 0.4 10*3/uL (ref 0.0–0.7)
Eosinophils Relative: 7 %
HEMATOCRIT: 37.9 % (ref 36.0–46.0)
HEMOGLOBIN: 12.3 g/dL (ref 12.0–15.0)
LYMPHS PCT: 24 %
Lymphs Abs: 1.6 10*3/uL (ref 0.7–4.0)
MCH: 29 pg (ref 26.0–34.0)
MCHC: 32.5 g/dL (ref 30.0–36.0)
MCV: 89.4 fL (ref 78.0–100.0)
MONOS PCT: 13 %
Monocytes Absolute: 0.9 10*3/uL (ref 0.1–1.0)
NEUTROS ABS: 3.8 10*3/uL (ref 1.7–7.7)
NEUTROS PCT: 55 %
PLATELETS: 132 10*3/uL — AB (ref 150–400)
RBC: 4.24 MIL/uL (ref 3.87–5.11)
RDW: 13.7 % (ref 11.5–15.5)
WBC: 6.8 10*3/uL (ref 4.0–10.5)

## 2016-11-01 LAB — VANCOMYCIN, TROUGH: VANCOMYCIN TR: 17 ug/mL (ref 15–20)

## 2016-11-01 LAB — COMPREHENSIVE METABOLIC PANEL
ALT: 36 U/L (ref 14–54)
ANION GAP: 10 (ref 5–15)
AST: 33 U/L (ref 15–41)
Albumin: 3.5 g/dL (ref 3.5–5.0)
Alkaline Phosphatase: 83 U/L (ref 38–126)
BUN: 18 mg/dL (ref 6–20)
CALCIUM: 9 mg/dL (ref 8.9–10.3)
CHLORIDE: 102 mmol/L (ref 101–111)
CO2: 25 mmol/L (ref 22–32)
Creatinine, Ser: 0.78 mg/dL (ref 0.44–1.00)
GFR calc non Af Amer: >60 mL/min (ref >60)
Glucose, Bld: 94 mg/dL (ref 65–99)
Potassium: 3.8 mmol/L (ref 3.5–5.1)
SODIUM: 137 mmol/L (ref 135–145)
Total Bilirubin: 0.7 mg/dL (ref 0.3–1.2)
Total Protein: 6.9 g/dL (ref 6.5–8.1)

## 2016-11-01 LAB — C-REACTIVE PROTEIN

## 2016-11-15 ENCOUNTER — Other Ambulatory Visit: Payer: Self-pay | Admitting: Infectious Diseases

## 2016-11-15 DIAGNOSIS — N6315 Unspecified lump in the right breast, overlapping quadrants: Secondary | ICD-10-CM

## 2016-11-15 DIAGNOSIS — R590 Localized enlarged lymph nodes: Secondary | ICD-10-CM

## 2016-11-15 DIAGNOSIS — N6310 Unspecified lump in the right breast, unspecified quadrant: Secondary | ICD-10-CM

## 2016-11-23 ENCOUNTER — Ambulatory Visit
Admission: RE | Admit: 2016-11-23 | Discharge: 2016-11-23 | Disposition: A | Discharge status: Home / Self Care | Payer: Medicare Other | Source: Ambulatory Visit | Attending: Infectious Diseases | Admitting: Infectious Diseases

## 2016-11-23 DIAGNOSIS — R590 Localized enlarged lymph nodes: Secondary | ICD-10-CM | POA: Diagnosis not present

## 2016-11-23 DIAGNOSIS — N6315 Unspecified lump in the right breast, overlapping quadrants: Secondary | ICD-10-CM

## 2016-11-23 DIAGNOSIS — N6310 Unspecified lump in the right breast, unspecified quadrant: Secondary | ICD-10-CM

## 2016-11-23 DIAGNOSIS — N63 Unspecified lump in unspecified breast: Secondary | ICD-10-CM

## 2016-11-23 DIAGNOSIS — R921 Mammographic calcification found on diagnostic imaging of breast: Secondary | ICD-10-CM | POA: Diagnosis not present

## 2016-11-23 HISTORY — DX: Unspecified lump in unspecified breast: N63.0

## 2018-05-15 IMAGING — DX DG FOOT COMPLETE 3+V*L*
3 series · 3 of 3 positions shown · non-contrast
Comparison: None.

CLINICAL DATA: LEFT foot pain with redness.  History of diabetes.

EXAM:
LEFT FOOT - COMPLETE 3+ VIEW

[foot ap]
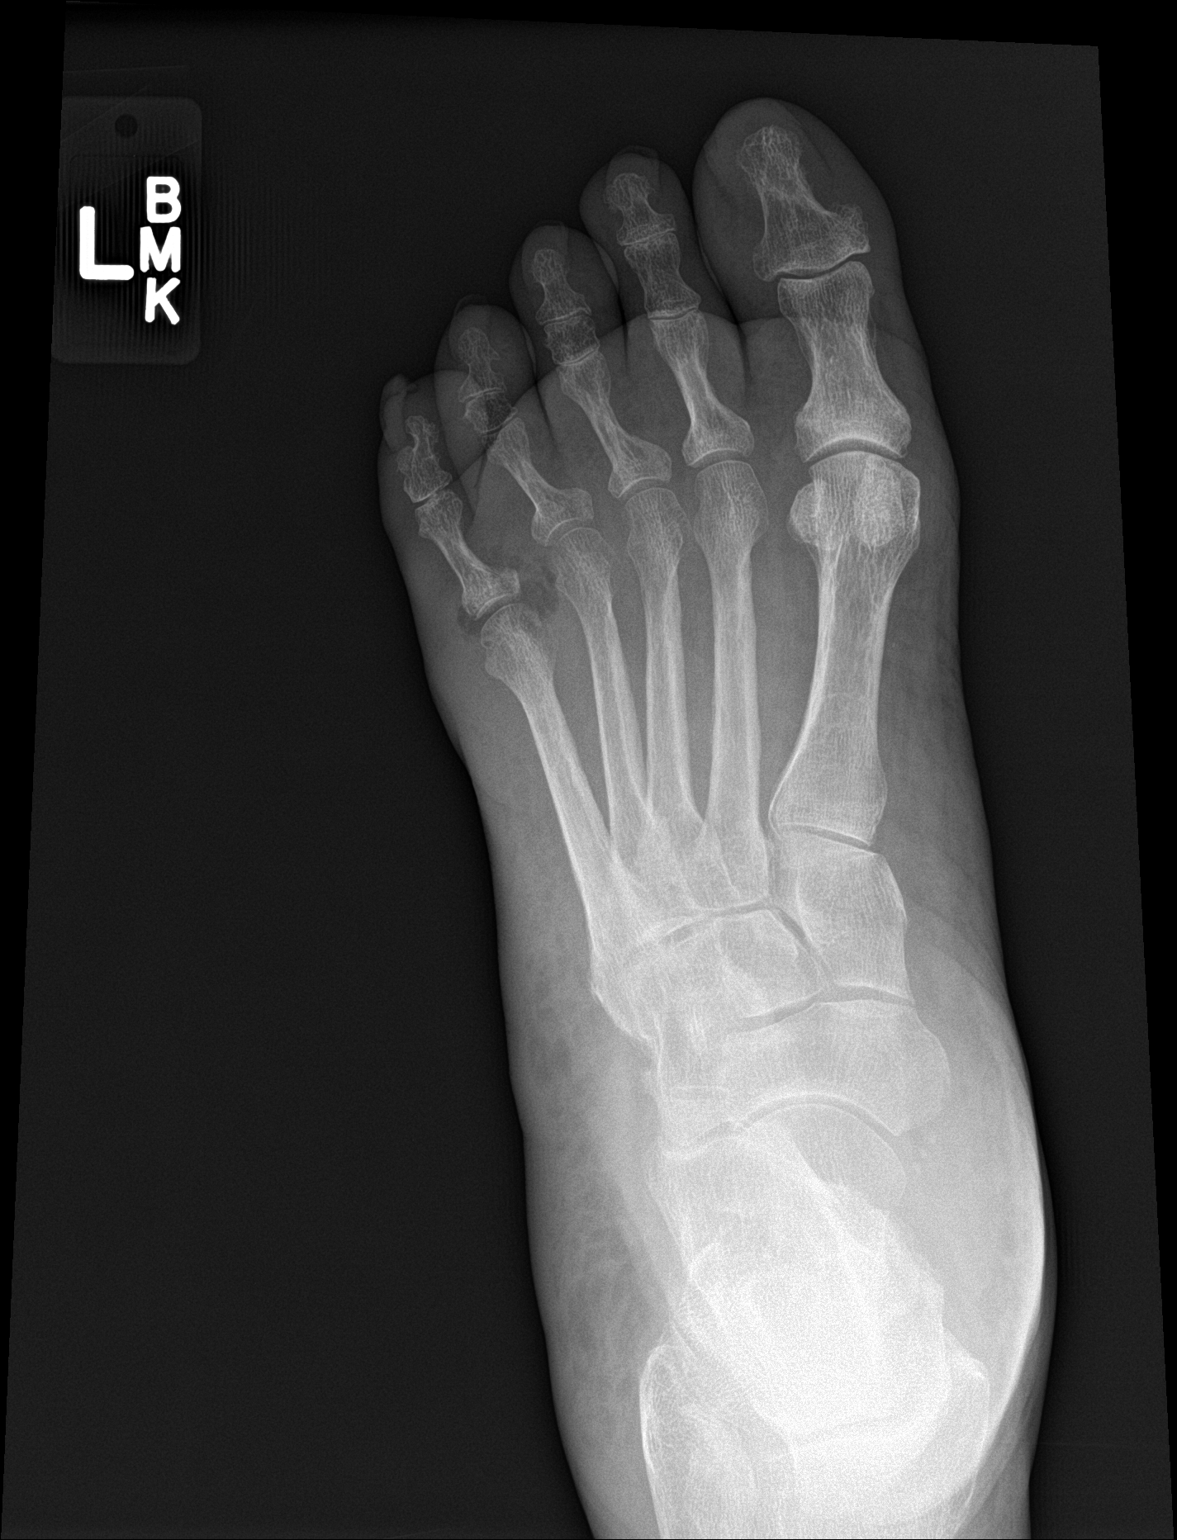

[foot obl]
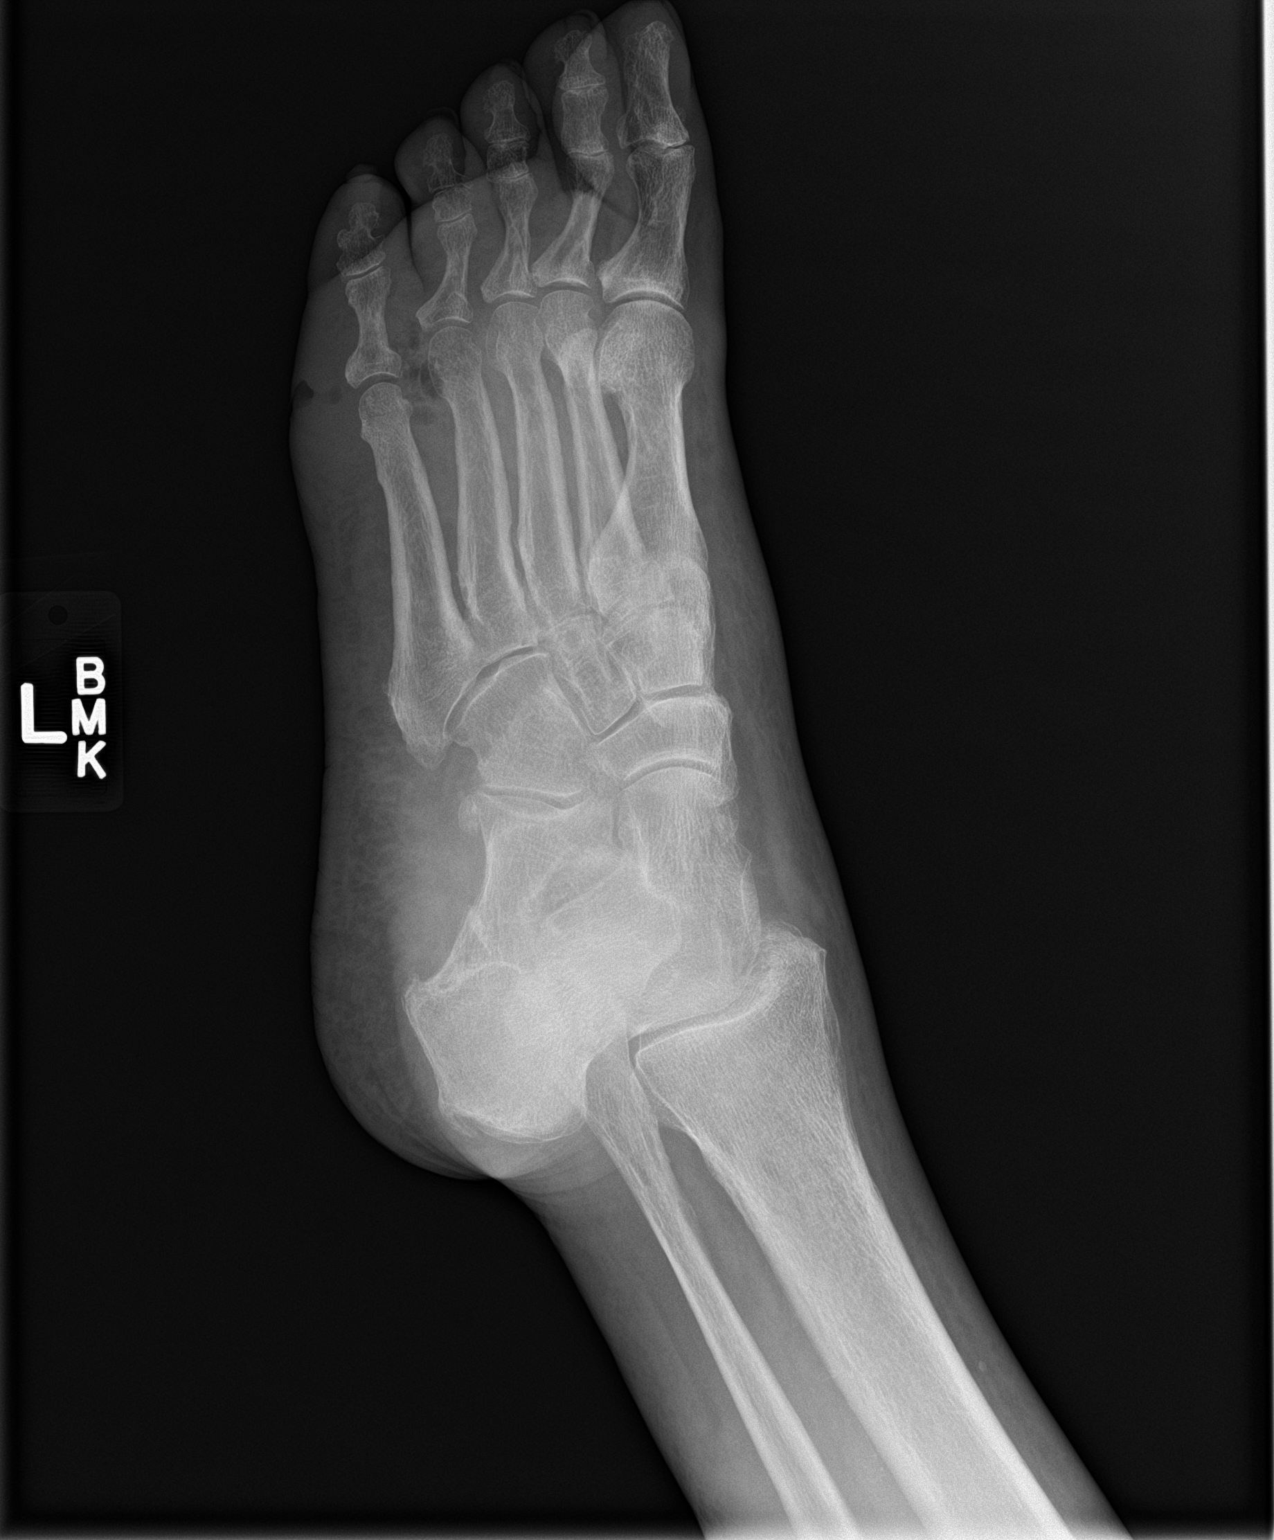

[foot lat]
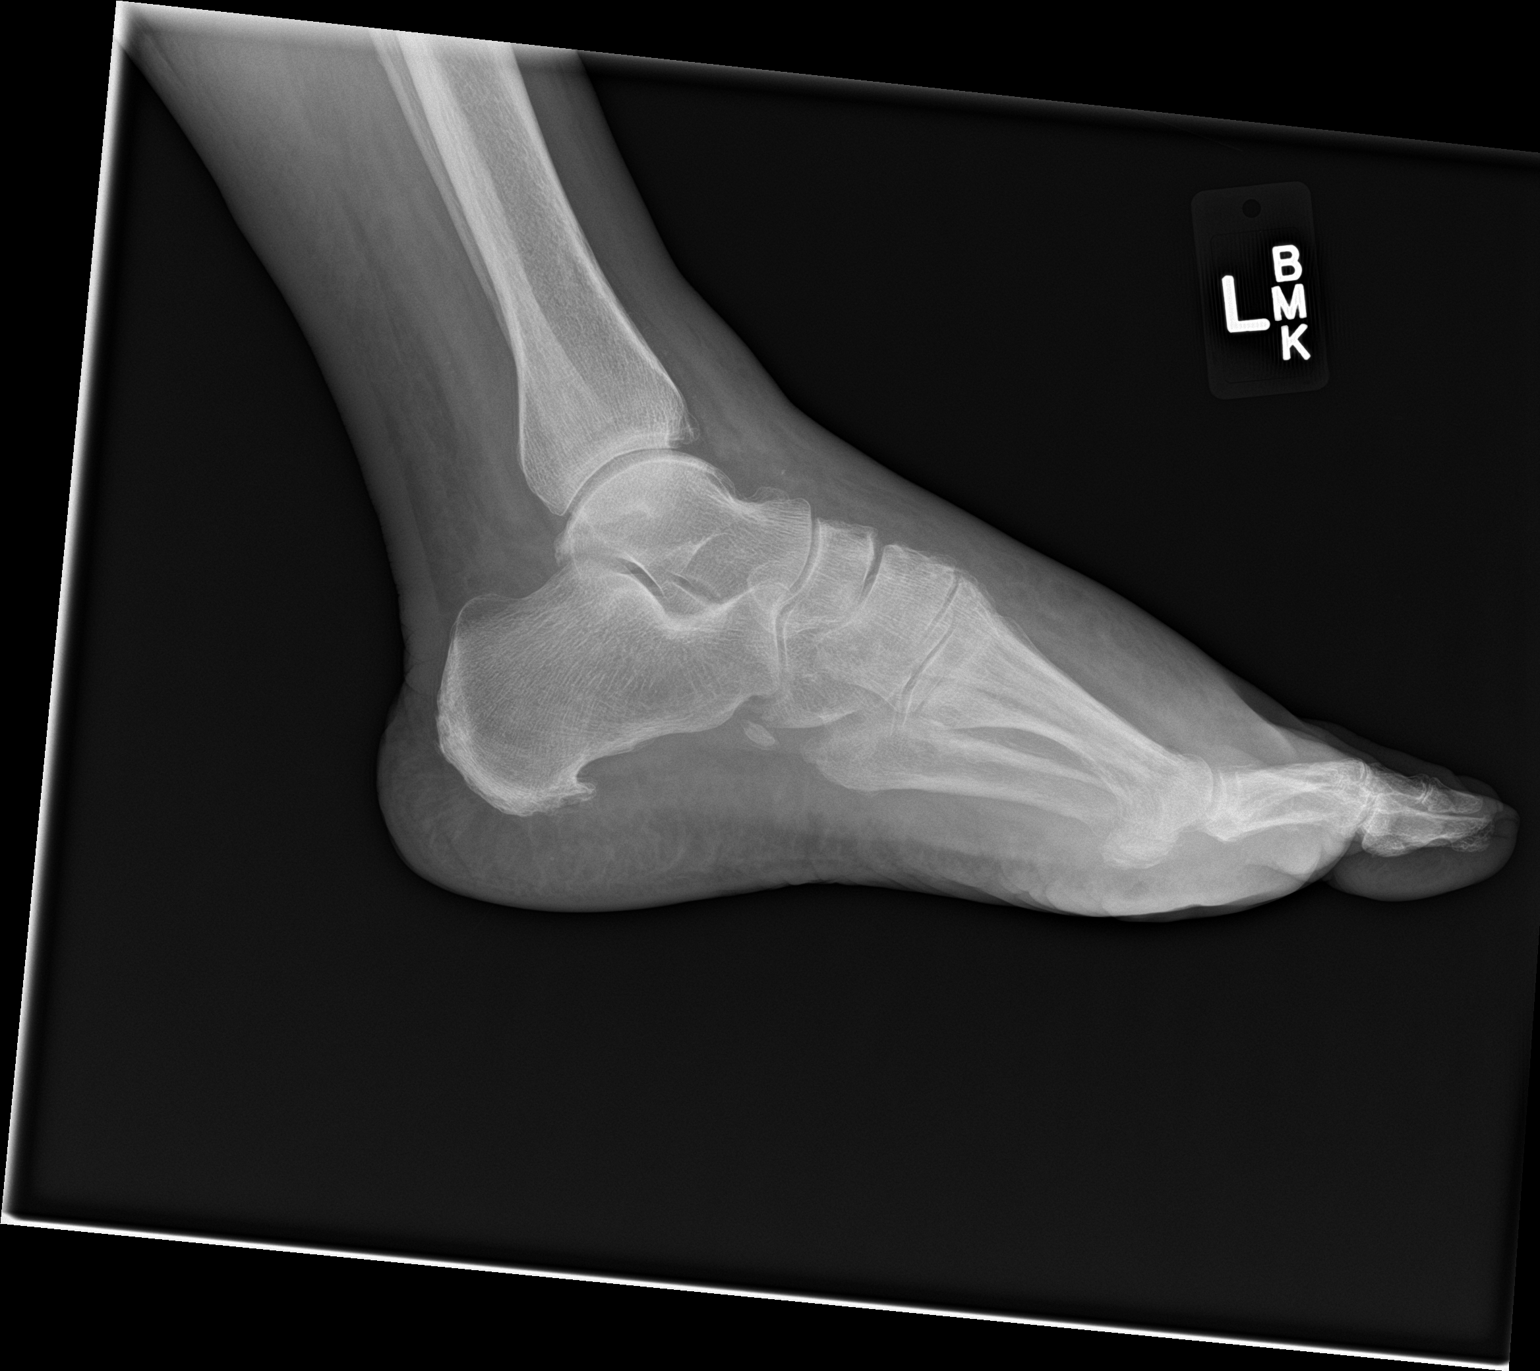

[3 of 3 positions shown; findings below may reference images not displayed]

FINDINGS: There is air in the soft tissues lateral to the fifth MTP joint, and
surrounding the MTP joint. No radiopaque foreign body is seen. There
is marked soft tissue swelling. No osseous erosion is seen.
Osteopenia is noted.
IMPRESSION: Air in the soft tissues adjacent to, and surrounding, the fifth MTP
joint. No fracture or osseous abnormality.

## 2019-01-16 DIAGNOSIS — N183 Chronic kidney disease, stage 3 unspecified: Secondary | ICD-10-CM | POA: Insufficient documentation

## 2019-02-21 ENCOUNTER — Inpatient Hospital Stay
Admission: AD | Admit: 2019-02-21 | Discharge: 2019-02-25 | DRG: 617 | Disposition: A | Discharge status: Home Health | Payer: Medicare Other | Source: Ambulatory Visit | Attending: Internal Medicine | Admitting: Internal Medicine

## 2019-02-21 ENCOUNTER — Other Ambulatory Visit: Payer: Self-pay

## 2019-02-21 ENCOUNTER — Other Ambulatory Visit
Admission: RE | Admit: 2019-02-21 | Discharge: 2019-02-21 | Disposition: A | Discharge status: Home / Self Care | Payer: Medicare Other | Source: Ambulatory Visit | Attending: Podiatry | Admitting: Podiatry

## 2019-02-21 ENCOUNTER — Inpatient Hospital Stay: Payer: Medicare Other

## 2019-02-21 DIAGNOSIS — B9561 Methicillin susceptible Staphylococcus aureus infection as the cause of diseases classified elsewhere: Secondary | ICD-10-CM | POA: Diagnosis present

## 2019-02-21 DIAGNOSIS — E669 Obesity, unspecified: Secondary | ICD-10-CM | POA: Diagnosis present

## 2019-02-21 DIAGNOSIS — Z8249 Family history of ischemic heart disease and other diseases of the circulatory system: Secondary | ICD-10-CM | POA: Diagnosis not present

## 2019-02-21 DIAGNOSIS — Z880 Allergy status to penicillin: Secondary | ICD-10-CM

## 2019-02-21 DIAGNOSIS — Z79899 Other long term (current) drug therapy: Secondary | ICD-10-CM

## 2019-02-21 DIAGNOSIS — Z853 Personal history of malignant neoplasm of breast: Secondary | ICD-10-CM | POA: Diagnosis not present

## 2019-02-21 DIAGNOSIS — E1140 Type 2 diabetes mellitus with diabetic neuropathy, unspecified: Secondary | ICD-10-CM | POA: Diagnosis present

## 2019-02-21 DIAGNOSIS — Z7984 Long term (current) use of oral hypoglycemic drugs: Secondary | ICD-10-CM | POA: Diagnosis not present

## 2019-02-21 DIAGNOSIS — Z888 Allergy status to other drugs, medicaments and biological substances status: Secondary | ICD-10-CM | POA: Diagnosis not present

## 2019-02-21 DIAGNOSIS — M868X7 Other osteomyelitis, ankle and foot: Secondary | ICD-10-CM | POA: Diagnosis present

## 2019-02-21 DIAGNOSIS — E1169 Type 2 diabetes mellitus with other specified complication: Principal | ICD-10-CM | POA: Diagnosis present

## 2019-02-21 DIAGNOSIS — Z66 Do not resuscitate: Secondary | ICD-10-CM | POA: Diagnosis present

## 2019-02-21 DIAGNOSIS — I10 Essential (primary) hypertension: Secondary | ICD-10-CM | POA: Diagnosis present

## 2019-02-21 DIAGNOSIS — M869 Osteomyelitis, unspecified: Secondary | ICD-10-CM | POA: Diagnosis present

## 2019-02-21 DIAGNOSIS — L03032 Cellulitis of left toe: Secondary | ICD-10-CM | POA: Diagnosis present

## 2019-02-21 DIAGNOSIS — Z20828 Contact with and (suspected) exposure to other viral communicable diseases: Secondary | ICD-10-CM | POA: Diagnosis present

## 2019-02-21 DIAGNOSIS — Z7982 Long term (current) use of aspirin: Secondary | ICD-10-CM | POA: Diagnosis not present

## 2019-02-21 DIAGNOSIS — M00072 Staphylococcal arthritis, left ankle and foot: Secondary | ICD-10-CM | POA: Diagnosis present

## 2019-02-21 DIAGNOSIS — Z6833 Body mass index (BMI) 33.0-33.9, adult: Secondary | ICD-10-CM | POA: Diagnosis not present

## 2019-02-21 DIAGNOSIS — M86172 Other acute osteomyelitis, left ankle and foot: Secondary | ICD-10-CM

## 2019-02-21 LAB — COMPREHENSIVE METABOLIC PANEL
ALT: 16 U/L (ref 0–44)
AST: 21 U/L (ref 15–41)
Albumin: 3.3 g/dL — ABNORMAL LOW (ref 3.5–5.0)
Alkaline Phosphatase: 96 U/L (ref 38–126)
Anion gap: 9 (ref 5–15)
BUN: 15 mg/dL (ref 8–23)
CO2: 25 mmol/L (ref 22–32)
Calcium: 9.4 mg/dL (ref 8.9–10.3)
Chloride: 105 mmol/L (ref 98–111)
Creatinine, Ser: 0.97 mg/dL (ref 0.44–1.00)
GFR calc Af Amer: >60 mL/min (ref >60)
GFR calc non Af Amer: 57 mL/min — ABNORMAL LOW (ref >60)
Glucose, Bld: 99 mg/dL (ref 70–99)
Potassium: 4.1 mmol/L (ref 3.5–5.1)
Sodium: 139 mmol/L (ref 135–145)
Total Bilirubin: 0.7 mg/dL (ref 0.3–1.2)
Total Protein: 8.1 g/dL (ref 6.5–8.1)

## 2019-02-21 LAB — PROTIME-INR
INR: 1 (ref 0.8–1.2)
Prothrombin Time: 13.4 seconds (ref 11.4–15.2)

## 2019-02-21 LAB — GLUCOSE, CAPILLARY
Glucose-Capillary: 160 mg/dL — ABNORMAL HIGH (ref 70–99)
Glucose-Capillary: 115 mg/dL — ABNORMAL HIGH (ref 70–99)

## 2019-02-21 LAB — SARS CORONAVIRUS 2 BY RT PCR (HOSPITAL ORDER, PERFORMED IN ~~LOC~~ HOSPITAL LAB): SARS Coronavirus 2: NEGATIVE

## 2019-02-21 LAB — APTT: aPTT: 30 seconds (ref 24–36)

## 2019-02-21 LAB — HEMOGLOBIN A1C
Hgb A1c MFr Bld: 6.7 % — ABNORMAL HIGH (ref 4.8–5.6)
Mean Plasma Glucose: 145.59 mg/dL

## 2019-02-21 MED ORDER — METRONIDAZOLE IN NACL 5-0.79 MG/ML-% IV SOLN
500.0000 mg | Freq: Three times a day (TID) | INTRAVENOUS | Status: DC
  Administered 2019-02-21 – 2019-02-25 (×10): 500 mg via INTRAVENOUS
  Filled 2019-02-21 (×16): qty 100

## 2019-02-21 MED ORDER — GADOBUTROL 1 MMOL/ML IV SOLN
9.0000 mL | Freq: Once | INTRAVENOUS | Status: AC | PRN
  Administered 2019-02-21: 9 mL via INTRAVENOUS

## 2019-02-21 MED ORDER — HYDROCODONE-ACETAMINOPHEN 5-325 MG PO TABS: 1.0000 | ORAL_TABLET | ORAL | Status: DC | PRN

## 2019-02-21 MED ORDER — ENOXAPARIN SODIUM 40 MG/0.4ML ~~LOC~~ SOLN: 40.0000 mg | SUBCUTANEOUS | Status: DC

## 2019-02-21 MED ORDER — VANCOMYCIN HCL IN DEXTROSE 750-5 MG/150ML-% IV SOLN
750.0000 mg | INTRAVENOUS | Status: DC
  Administered 2019-02-22 – 2019-02-24 (×3): 750 mg via INTRAVENOUS
  Filled 2019-02-21 (×3): qty 150

## 2019-02-21 MED ORDER — SENNOSIDES-DOCUSATE SODIUM 8.6-50 MG PO TABS: 1.0000 | ORAL_TABLET | Freq: Every evening | ORAL | Status: DC | PRN

## 2019-02-21 MED ORDER — SODIUM CHLORIDE 0.9% FLUSH
3.0000 mL | Freq: Two times a day (BID) | INTRAVENOUS | Status: DC
  Administered 2019-02-21 – 2019-02-25 (×8): 3 mL via INTRAVENOUS

## 2019-02-21 MED ORDER — ALBUTEROL SULFATE (2.5 MG/3ML) 0.083% IN NEBU: 2.5000 mg | INHALATION_SOLUTION | RESPIRATORY_TRACT | Status: DC | PRN

## 2019-02-21 MED ORDER — HYDROCHLOROTHIAZIDE 25 MG PO TABS
12.5000 mg | ORAL_TABLET | Freq: Every evening | ORAL | Status: DC
  Administered 2019-02-21 – 2019-02-24 (×4): 12.5 mg via ORAL
  Filled 2019-02-21 (×4): qty 1

## 2019-02-21 MED ORDER — SODIUM CHLORIDE 0.9 % IV SOLN
2.0000 g | Freq: Three times a day (TID) | INTRAVENOUS | Status: DC
  Administered 2019-02-21 – 2019-02-22 (×3): 2 g via INTRAVENOUS
  Filled 2019-02-21 (×5): qty 2

## 2019-02-21 MED ORDER — INSULIN ASPART 100 UNIT/ML ~~LOC~~ SOLN: 0.0000 [IU] | Freq: Every day | SUBCUTANEOUS | Status: DC

## 2019-02-21 MED ORDER — ONDANSETRON HCL 4 MG/2ML IJ SOLN: 4.0000 mg | Freq: Four times a day (QID) | INTRAMUSCULAR | Status: DC | PRN

## 2019-02-21 MED ORDER — ENOXAPARIN SODIUM 40 MG/0.4ML ~~LOC~~ SOLN
40.0000 mg | SUBCUTANEOUS | Status: DC
  Administered 2019-02-22 – 2019-02-24 (×3): 40 mg via SUBCUTANEOUS
  Filled 2019-02-21 (×4): qty 0.4

## 2019-02-21 MED ORDER — VANCOMYCIN HCL 10 G IV SOLR
1500.0000 mg | Freq: Once | INTRAVENOUS | Status: AC
  Administered 2019-02-21: 1500 mg via INTRAVENOUS
  Filled 2019-02-21: qty 1500

## 2019-02-21 MED ORDER — BISACODYL 5 MG PO TBEC
5.0000 mg | DELAYED_RELEASE_TABLET | Freq: Every day | ORAL | Status: DC | PRN
  Administered 2019-02-25: 5 mg via ORAL
  Filled 2019-02-21: qty 1

## 2019-02-21 MED ORDER — ACETAMINOPHEN 650 MG RE SUPP: 650.0000 mg | Freq: Four times a day (QID) | RECTAL | Status: DC | PRN

## 2019-02-21 MED ORDER — SODIUM CHLORIDE 0.9 % IV SOLN
250.0000 mL | INTRAVENOUS | Status: DC | PRN
  Administered 2019-02-21 – 2019-02-24 (×3): 250 mL via INTRAVENOUS

## 2019-02-21 MED ORDER — LISINOPRIL 10 MG PO TABS
10.0000 mg | ORAL_TABLET | Freq: Every day | ORAL | Status: DC
  Administered 2019-02-22 – 2019-02-25 (×4): 10 mg via ORAL
  Filled 2019-02-21 (×4): qty 1

## 2019-02-21 MED ORDER — INSULIN ASPART 100 UNIT/ML ~~LOC~~ SOLN
0.0000 [IU] | Freq: Three times a day (TID) | SUBCUTANEOUS | Status: DC
  Administered 2019-02-22 – 2019-02-24 (×3): 1 [IU] via SUBCUTANEOUS
  Administered 2019-02-25: 2 [IU] via SUBCUTANEOUS
  Filled 2019-02-21 (×5): qty 1

## 2019-02-21 MED ORDER — SODIUM CHLORIDE 0.9% FLUSH
3.0000 mL | INTRAVENOUS | Status: DC | PRN
  Filled 2019-02-21: qty 3

## 2019-02-21 MED ORDER — ACETAMINOPHEN 325 MG PO TABS: 650.0000 mg | ORAL_TABLET | Freq: Four times a day (QID) | ORAL | Status: DC | PRN

## 2019-02-21 MED ORDER — ONDANSETRON HCL 4 MG PO TABS: 4.0000 mg | ORAL_TABLET | Freq: Four times a day (QID) | ORAL | Status: DC | PRN

## 2019-02-21 NOTE — Consult Note (Signed)
Pharmacy Antibiotic Note  Joann [name] is a 72 y.o. female admitted on 02/21/2019 with possible osteomyelitis.  Pharmacy has been consulted for vancomycin and cefepime dosing. She has a penicillin allergy which she confirms to me but has tolerated cephalosporins in the recent past.  Plan:  1) start vancomycin 750 mg IV Q 24 hrs following a 1500 mg loading dose Goal AUC 400-550 Expected AUC: 455 SCr used: 0.97 mg/dL BMI 33.1 T1/2: 17.9 h Css min: 11.9 mcg/mL  2) start cefepime 2 grams IV every 8 hours   Temp (24hrs), Avg:98.4 F (36.9 C), Min:98.4 F (36.9 C), Max:98.4 F (36.9 C)  Recent Labs  Lab 02/21/19 1301  CREATININE 0.97    CrCl cannot be calculated (Unknown ideal weight.).    Allergies  Allergen Reactions  . Neosporin [Neomycin-Bacitracin Zn-Polymyx]     "it just don't agree with me"   . Penicillins Itching    Has patient had a PCN reaction causing immediate rash, facial/tongue/throat swelling, SOB or lightheadedness with hypotension: no Has patient had a PCN reaction causing severe rash involving mucus membranes or skin necrosis: no Has patient had a PCN reaction that required hospitalization no Has patient had a PCN reaction occurring within the last 10 years:no If all of the above answers are "NO", then may proceed with Cephalosporin use.   . Statins     hallucinations    Antimicrobials this admission: vancomycin 7/29 >> cefepime 7/29 >>  metronidazole 7/29 >>  Microbiology results: 7/29 WCx: pending  7/29 SARS CoV-2: negative   Thank you for allowing pharmacy to be a part of this patient's care.  Dallie Piles, PharmD 02/21/2019 1:48 PM

## 2019-02-21 NOTE — Progress Notes (Addendum)
Advanced Care Plan.  Purpose of Encounter: CODE STATUS. Parties in Attendance: The patient and me. Patient's Decisional Capacity: Yes. Medical Story: Joann [name]  is a 73 y.o. female with a known history of breast cancer, hypertension, diabetes.  Patient is being admitted for left foot infection, possible osteomyelitis.  I discussed with patient about her current condition, prognosis and CODE STATUS.  The patient does not want to be resuscitated and intubated if she has cardiopulmonary arrest. Plan:  Code Status: DNR. Time spent discussing advance care planning: 17 minutes.

## 2019-02-21 NOTE — H&P (Addendum)
[facility] at [place] NAME: Joann [name]    MR#:  [id number]  DATE OF BIRTH:  October 08, 1945  DATE OF ADMISSION:  02/21/2019  PRIMARY CARE PHYSICIAN: Baxter Hire, MD   REQUESTING/REFERRING PHYSICIAN: Dr. Elvina Mattes  CHIEF COMPLAINT:  No chief complaint on file.  Left foot infection HISTORY OF PRESENT ILLNESS:  Joann [name]  is a 73 y.o. female with a known history of breast cancer, hypertension, diabetes.  Patient is sent to the medical floor by Dr. Elvina Mattes for direct admission due to left foot infection, possible osteomyelitis.  The patient has no complaints.  She denies any symptoms.  She got left foot injury since last months.  Left foot wound wound still has not healed. PAST MEDICAL HISTORY:   Past Medical History:  Diagnosis Date  . Breast cancer ([place]) 2007   right breast lumpectomy with 36 rad tx  . Breast mass 11/23/2016   6 o'clock  . Diabetes mellitus without complication ([place])   . Hypertension     PAST SURGICAL HISTORY:   Past Surgical History:  Procedure Laterality Date  . BREAST BIOPSY Right 2007   positive.  Lumpectomy with rad tx  . BREAST LUMPECTOMY Right 2007  . INCISION AND DRAINAGE Left 10/16/2016   Procedure: INCISION AND DRAINAGE;  Surgeon: Albertine Patricia, DPM;  Location: [facility] ORS;  Service: Podiatry;  Laterality: Left;    SOCIAL HISTORY:   Social History   Tobacco Use  . Smoking status: Never Smoker  . Smokeless tobacco: Never Used  Substance Use Topics  . Alcohol use: No    FAMILY HISTORY:   Family History  Problem Relation Age of Onset  . Dementia Mother   . CAD Mother   . CAD Father   . Deep vein thrombosis Father   . Breast cancer Neg Hx     DRUG ALLERGIES:   Allergies  Allergen Reactions  . Neosporin [Neomycin-Bacitracin Zn-Polymyx]     "it just don't agree with me"   . Penicillins Itching    Has patient had a PCN reaction causing immediate rash, facial/tongue/throat swelling,  SOB or lightheadedness with hypotension: no Has patient had a PCN reaction causing severe rash involving mucus membranes or skin necrosis: no Has patient had a PCN reaction that required hospitalization no Has patient had a PCN reaction occurring within the last 10 years:no If all of the above answers are "NO", then may proceed with Cephalosporin use.   . Statins     hallucinations    REVIEW OF SYSTEMS:   Review of Systems  Constitutional: Negative for chills, fever and malaise/fatigue.  HENT: Negative for sore throat.   Eyes: Negative for blurred vision and double vision.  Respiratory: Negative for cough, hemoptysis, shortness of breath, wheezing and stridor.   Cardiovascular: Negative for chest pain, palpitations, orthopnea and leg swelling.  Gastrointestinal: Negative for abdominal pain, blood in stool, diarrhea, melena, nausea and vomiting.  Genitourinary: Negative for dysuria, flank pain and hematuria.  Musculoskeletal: Negative for back pain and joint pain.  Neurological: Negative for dizziness, sensory change, focal weakness, seizures, loss of consciousness, weakness and headaches.  Endo/Heme/Allergies: Negative for polydipsia.  Psychiatric/Behavioral: Negative for depression. The patient is not nervous/anxious.     MEDICATIONS AT HOME:   Prior to Admission medications   Medication Sig Start Date End Date Taking? Authorizing Provider  aspirin EC 81 MG tablet Take 81 mg by mouth daily.   Yes [provider]  hydrochlorothiazide (HYDRODIURIL)  12.5 MG tablet Take 12.5 mg by mouth every evening. 10/12/16  Yes [provider]  lisinopril (PRINIVIL,ZESTRIL) 10 MG tablet Take 10 mg by mouth daily. 07/30/16  Yes [provider]  metFORMIN (GLUCOPHAGE) 500 MG tablet Take 500 mg by mouth 2 (two) times daily. 09/30/16  Yes [provider]      VITAL SIGNS:  Blood pressure (!) 157/75, pulse 92, temperature 98.4 F (36.9 C), temperature source Oral,  resp. rate 17, SpO2 99 %.  PHYSICAL EXAMINATION:  Physical Exam  GENERAL:  73 y.o.-year-old patient lying in the bed with no acute distress.  EYES: Pupils equal, round, reactive to light and accommodation. No scleral icterus. Extraocular muscles intact.  HEENT: Head atraumatic, normocephalic. Oropharynx and nasopharynx clear.  NECK:  Supple, no jugular venous distention. No thyroid enlargement, no tenderness.  LUNGS: Normal breath sounds bilaterally, no wheezing, rales,rhonchi or crepitation. No use of accessory muscles of respiration.  CARDIOVASCULAR: S1, S2 normal. No murmurs, rubs, or gallops.  ABDOMEN: Soft, nontender, nondistended. Bowel sounds present. No organomegaly or mass.  EXTREMITIES: No pedal edema, cyanosis, or clubbing.  Right foot in dressing. NEUROLOGIC: Cranial nerves II through XII are intact. Muscle strength 5/5 in all extremities. Sensation intact. Gait not checked.  PSYCHIATRIC: The patient is alert and oriented x 3.  SKIN: No obvious rash, lesion, or ulcer.   LABORATORY PANEL:   CBC No results for input(s): WBC, HGB, HCT, PLT in the last 168 hours. ------------------------------------------------------------------------------------------------------------------  Chemistries  No results for input(s): NA, K, CL, CO2, GLUCOSE, BUN, CREATININE, CALCIUM, MG, AST, ALT, ALKPHOS, BILITOT in the last 168 hours.  Invalid input(s): GFRCGP ------------------------------------------------------------------------------------------------------------------  Cardiac Enzymes No results for input(s): TROPONINI in the last 168 hours. ------------------------------------------------------------------------------------------------------------------  RADIOLOGY:  No results found.    IMPRESSION AND PLAN:   Left foot infection, possible osteomyelitis. The patient is admitted to medical floor. Per Dr. Elvina Mattes, MRI of left foot, IV antibiotics and follow-up Dr. Vickki Muff for  surgery tomorrow. Hypertension.  Continue home hypertension medication. Diabetes.  Start sliding scale.  Hold metformin.  All the records are reviewed and case discussed with ED provider. Management plans discussed with the patient, family and they are in agreement.  CODE STATUS: DNR.  TOTAL TIME TAKING CARE OF THIS PATIENT: 46 minutes.    Demetrios Loll M.D on 02/21/2019 at 12:47 PM  Between 7am to 6pm - Pager - [phone]  After 6pm go to [email or web address] - Proofreader  [employer] [facility] [facility]  Office  [phone]  CC: Primary care physician; Baxter Hire, MD   Note: This dictation was prepared with Dragon dictation along with smaller phrase technology. Any transcriptional errors that result from this process are unin

## 2019-02-21 NOTE — Consult Note (Signed)
Pt briefly evaluated.  Seen earlier today in outpt clinic.  Xrays with osteomyelitis lesser toe.  MRI ordered.   Will need amputation.  Will f/u tomorrow. Further eval at that time and plan for surgery on Friday.

## 2019-02-22 LAB — BASIC METABOLIC PANEL
Anion gap: 7 (ref 5–15)
BUN: 16 mg/dL (ref 8–23)
CO2: 25 mmol/L (ref 22–32)
Calcium: 8.6 mg/dL — ABNORMAL LOW (ref 8.9–10.3)
Chloride: 103 mmol/L (ref 98–111)
Creatinine, Ser: 0.94 mg/dL (ref 0.44–1.00)
GFR calc Af Amer: >60 mL/min (ref >60)
GFR calc non Af Amer: 59 mL/min — ABNORMAL LOW (ref >60)
Glucose, Bld: 136 mg/dL — ABNORMAL HIGH (ref 70–99)
Potassium: 3.6 mmol/L (ref 3.5–5.1)
Sodium: 135 mmol/L (ref 135–145)

## 2019-02-22 LAB — CBC
HCT: 36.5 % (ref 36.0–46.0)
Hemoglobin: 11.7 g/dL — ABNORMAL LOW (ref 12.0–15.0)
MCH: 29 pg (ref 26.0–34.0)
MCHC: 32.1 g/dL (ref 30.0–36.0)
MCV: 90.6 fL (ref 80.0–100.0)
Platelets: 364 10*3/uL (ref 150–400)
RBC: 4.03 MIL/uL (ref 3.87–5.11)
RDW: 12.8 % (ref 11.5–15.5)
WBC: 10.4 10*3/uL (ref 4.0–10.5)
nRBC: 0 % (ref 0.0–0.2)

## 2019-02-22 LAB — GLUCOSE, CAPILLARY: Glucose-Capillary: 141 mg/dL — ABNORMAL HIGH (ref 70–99)

## 2019-02-22 MED ORDER — SODIUM CHLORIDE 0.9 % IV SOLN
2.0000 g | Freq: Two times a day (BID) | INTRAVENOUS | Status: DC
  Administered 2019-02-23 – 2019-02-24 (×3): 2 g via INTRAVENOUS
  Filled 2019-02-22 (×5): qty 2

## 2019-02-22 NOTE — Consult Note (Signed)
Pharmacy Antibiotic Note  Joann [name] is a 73 y.o. female admitted on 02/21/2019 with possible osteomyelitis.  Pharmacy has been consulted for vancomycin and cefepime dosing. She has a penicillin allergy which she confirms to me but has tolerated cephalosporins in the recent past.  Plan:  1) start vancomycin 750 mg IV Q 24 hrs following a 1500 mg loading dose Goal AUC 400-550 Expected AUC: 455 SCr used: 0.97 mg/dL BMI 33.1 T1/2: 17.9 h Css min: 11.9 mcg/mL  2) start cefepime 2 grams IV every 8 hours  7/30- Crcl 52.8. Weight= 86.5kg.  Will adjust Cefepime to 2gm IV q12h.    Temp (24hrs), Avg:98.2 F (36.8 C), Min:97.9 F (36.6 C), Max:98.4 F (36.9 C)  Recent Labs  Lab 02/21/19 1301 02/22/19 0621  WBC  --  10.4  CREATININE 0.97 0.94    Estimated Creatinine Clearance: 52.8 mL/min (by C-G formula based on SCr of 0.94 mg/dL).    Allergies  Allergen Reactions  . Neosporin [Neomycin-Bacitracin Zn-Polymyx]     "it just don't agree with me"   . Penicillins Itching    Has patient had a PCN reaction causing immediate rash, facial/tongue/throat swelling, SOB or lightheadedness with hypotension: no Has patient had a PCN reaction causing severe rash involving mucus membranes or skin necrosis: no Has patient had a PCN reaction that required hospitalization no Has patient had a PCN reaction occurring within the last 10 years:no If all of the above answers are "NO", then may proceed with Cephalosporin use.   . Statins     hallucinations    Antimicrobials this admission: vancomycin 7/29 >> cefepime 7/29 >>  metronidazole 7/29 >>  Microbiology results: 7/29 WCx: pending  7/29 SARS CoV-2: negative   Thank you for allowing pharmacy to be a part of this patient's care.  Galadriel Shroff A, PharmD 02/22/2019 2:53 PM

## 2019-02-22 NOTE — Progress Notes (Signed)
[facility] NAME: Joann [name]    MR#:  [id number]  DATE OF BIRTH:  1946-05-21  SUBJECTIVE:  CHIEF COMPLAINT:  No chief complaint on file.  The patient has no complaints. REVIEW OF SYSTEMS:  Review of Systems  Constitutional: Negative for chills, fever and malaise/fatigue.  HENT: Negative for sore throat.   Eyes: Negative for blurred vision and double vision.  Respiratory: Negative for cough, hemoptysis, shortness of breath, wheezing and stridor.   Cardiovascular: Negative for chest pain, palpitations, orthopnea and leg swelling.  Gastrointestinal: Negative for abdominal pain, blood in stool, diarrhea, melena, nausea and vomiting.  Genitourinary: Negative for dysuria, flank pain and hematuria.  Musculoskeletal: Negative for back pain and joint pain.  Skin: Negative for rash.  Neurological: Negative for dizziness, sensory change, focal weakness, seizures, loss of consciousness, weakness and headaches.  Endo/Heme/Allergies: Negative for polydipsia.  Psychiatric/Behavioral: Negative for depression. The patient is not nervous/anxious.     DRUG ALLERGIES:   Allergies  Allergen Reactions  . Neosporin [Neomycin-Bacitracin Zn-Polymyx]     "it just don't agree with me"   . Penicillins Itching    Has patient had a PCN reaction causing immediate rash, facial/tongue/throat swelling, SOB or lightheadedness with hypotension: no Has patient had a PCN reaction causing severe rash involving mucus membranes or skin necrosis: no Has patient had a PCN reaction that required hospitalization no Has patient had a PCN reaction occurring within the last 10 years:no If all of the above answers are "NO", then may proceed with Cephalosporin use.   . Statins     hallucinations   VITALS:  Blood pressure 133/71, pulse 83, temperature 98.4 F (36.9 C), temperature source Oral, resp. rate 20, height 5\' 2"  (1.575 m), weight 86.5 kg, SpO2 95 %.  PHYSICAL EXAMINATION:  Physical Exam Constitutional:      General: She is not in acute distress.    Appearance: Normal appearance. She is obese.  HENT:     Head: Normocephalic.     Mouth/Throat:     Mouth: Mucous membranes are moist.  Eyes:     General: No scleral icterus.    Conjunctiva/sclera: Conjunctivae normal.     Pupils: Pupils are equal, round, and reactive to light.  Neck:     Musculoskeletal: Normal range of motion and neck supple.     Vascular: No JVD.     Trachea: No tracheal deviation.  Cardiovascular:     Rate and Rhythm: Normal rate and regular rhythm.     Heart sounds: Normal heart sounds. No murmur. No gallop.   Pulmonary:     Effort: Pulmonary effort is normal. No respiratory distress.     Breath sounds: Normal breath sounds. No wheezing or rales.  Abdominal:     General: Bowel sounds are normal. There is no distension.     Palpations: Abdomen is soft.     Tenderness: There is no abdominal tenderness. There is no rebound.  Musculoskeletal: Normal range of motion.        General: No tenderness.     Right lower leg: No edema.     Left lower leg: No edema.  Skin:    Findings: No erythema or rash.  Neurological:     General: No focal deficit present.     Mental Status: She is alert and oriented to person, place, and time.     Cranial Nerves: No cranial nerve deficit.  Psychiatric:  Mood and Affect: Mood normal.    LABORATORY PANEL:  Female CBC Recent Labs  Lab 02/22/19 0621  WBC 10.4  HGB 11.7*  HCT 36.5  PLT 364   ------------------------------------------------------------------------------------------------------------------ Chemistries  Recent Labs  Lab 02/21/19 1301 02/22/19 0621  NA 139 135  K 4.1 3.6  CL 105 103  CO2 25 25  GLUCOSE 99 136*  BUN 15 16  CREATININE 0.97 0.94  CALCIUM 9.4 8.6*  AST 21  --   ALT 16  --   ALKPHOS 96  --   BILITOT 0.7  --    RADIOLOGY:  Mr Foot Left W Wo Contrast  Result Date: 02/22/2019  CLINICAL DATA:  History of breast cancer.  Left foot infection. EXAM: MRI OF THE LEFT FOREFOOT WITHOUT AND WITH CONTRAST TECHNIQUE: Multiplanar, multisequence MR imaging of the left forefoot was performed both before and after administration of intravenous contrast. CONTRAST:  9 mL Gadavist COMPARISON:  Foot x-ray 10/15/2016 FINDINGS: Bones/Joint/Cartilage Soft tissue ulcer involving the second toe. Extensive bone destruction of second proximal and middle phalanx. Small amount of joint fluid in the second DIP joint with bone marrow edema at the base of the second distal phalanx which may reflect septic arthritis. Small joint effusion in the second MTP joint with bone marrow edema in the second metatarsal head concerning for septic arthritis. Abnormal phlegmonous inflammatory changes surrounding the second toe. Nonenhancing soft tissue along the plantar aspect of the second proximal phalanx and extending more laterally in the forefoot likely reflecting areas of fibrosis/callus. No drainable fluid collection. Severe soft tissue edema along the dorsal aspect of the midfoot and forefoot consistent with cellulitis. No acute fracture or dislocation. Normal alignment. 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 Ligaments Collateral ligaments are intact.  Lisfranc ligament is intact. Muscles and Tendons Flexor, peroneal and extensor compartment tendons are intact. Muscle edema throughout the plantar musculature. This may reflect an element of neurogenic edema versus myositis. Soft tissue No fluid collection or hematoma.  No soft tissue mass. IMPRESSION: 1. Soft tissue ulcer involving the second toe. Extensive osteomyelitis with bone destruction of the second proximal and middle phalanx. Septic arthritis of the second MTP joint and second DIP joint. Phlegmonous changes surrounding the second toe and cellulitis of the dorsal aspect of the midfoot and forefoot. 2. Muscle edema throughout the  plantar musculature. This may reflect an element of neurogenic edema versus infectious myositis given that is contiguous with the abnormality in the second toe. Electronically Signed   By: Kathreen Devoid   On: 02/22/2019 07:47   ASSESSMENT AND PLAN:   Left foot infection with osteomyelitis and septic arthritis. MRI:  1. Soft tissue ulcer involving the second toe. Extensive osteomyelitis with bone destruction of the second proximal and middle phalanx. Septic arthritis of the second MTP joint and second DIP joint. Phlegmonous changes surrounding the second toe and cellulitis of the dorsal aspect of the midfoot and forefoot. 2. Muscle edema throughout the plantar musculature. This may reflect an element of neurogenic edema versus infectious myositis given that is contiguous with the abnormality in the second toe. Surgery with possible amputation tomorrow per Dr. Vickki Muff.   Continue cefepime and vancomycin. Marland Kitchen Hypertension.  Continue home hypertension medication. Diabetes.  Continue sliding scale.  Hold metformin.  All the records are reviewed and case discussed with Care Management/Social Worker. Management plans discussed with the patient, family and they are in agreement.  CODE STATUS: DNR  TOTAL TIME TAKING CARE OF THIS PATIENT:  27 minutes.   More than 50% of the time was spent in counseling/coordination of care: YES  POSSIBLE D/C IN 2 DAYS, DEPENDING ON CLINICAL CONDITION.   Demetrios Loll M.D on 02/22/2019 at 11:31 AM  Between 7am to 6pm - Pager - [phone]  After 6pm go to [email or web address] - [facility]

## 2019-02-22 NOTE — TOC Initial Note (Signed)
Transition of Care [facility]) - Initial/Assessment Note    Patient Details  Name: Joann [name] MRN: [id number] Date of Birth: 1946-01-11  Transition of Care [facility]) CM/SW Contact:    Beverly Sessions, RN Phone Number: 02/22/2019, 12:07 PM  Clinical Narrative:                 Patient admitted from home for osteomyelitis.  Husband at bedside  Plan for amputation of toe tomorrow  Patient lives at home with husband.  PCP Edwina Barth.  Pharmacy [facility].  Denies issues with obtaining medications  Baseline patient is independent and still drives  Patient states that she has a "walking stick" at home  Requested PT eval post procedure.  Patient states that she has previously had IV antibiotics in the past with [facility] anticipates needs for home health RN for wound care, and potentially PT.  Patient agreeable and states that if services are indicated she would like to use [facility] again  Heads up referral to [facility] with [place]    Expected Discharge Plan: [place]     Patient Goals and CMS Choice   CMS Medicare.gov Compare Post Acute Care list provided to:: Patient Choice offered to / list presented to : Patient  Expected Discharge Plan and Services Expected Discharge Plan: [place]   Discharge Planning Services: CM Consult Post Acute Care Choice: [place] arrangements for the past 2 months: Single Family Home Expected Discharge Date: 02/23/19                                    Prior Living Arrangements/Services Living arrangements for the past 2 months: Single Family Home Lives with:: Spouse Patient language and need for interpreter reviewed:: Yes Do you feel safe going back to the place where you live?: Yes      Need for Family Participation in Patient Care: Yes (Comment) Care giver support system in place?: Yes (comment) Current home services: DME Criminal Activity/Legal  Involvement Pertinent to Current Situation/Hospitalization: No - Comment as needed  Activities of Daily Living Home Assistive Devices/Equipment: Cane (specify quad or straight) ADL Screening (condition at time of admission) Patient's cognitive ability adequate to safely complete daily activities?: Yes Is the patient deaf or have difficulty hearing?: No Does the patient have difficulty seeing, even when wearing glasses/contacts?: No Does the patient have difficulty concentrating, remembering, or making decisions?: No Patient able to express need for assistance with ADLs?: Yes Does the patient have difficulty dressing or bathing?: No Independently performs ADLs?: Yes (appropriate for developmental age) Does the patient have difficulty walking or climbing stairs?: Yes Weakness of Legs: Both Weakness of Arms/Hands: None  Permission Sought/Granted                  Emotional Assessment Appearance:: Appears stated age Attitude/Demeanor/Rapport: Gracious Affect (typically observed): Accepting Orientation: : Oriented to Self, Oriented to Place, Oriented to  Time, Oriented to Situation   Psych Involvement: No (comment)  Admission diagnosis:  Osteomyelitis Diabetes Patient Active Problem List   Diagnosis Date Noted  . Osteomyelitis of left foot ([place]) 02/21/2019  . Cellulitis 10/15/2016   PCP:  Baxter Hire, MD Pharmacy:   [facility] [address], [place] AT [facility] [place] [address] Phone: [phone] Fax: [phone]  Social Determinants of Health (SDOH) Interventions    Readmission Risk Interventions No flowsheet data found.

## 2019-02-22 NOTE — Consult Note (Addendum)
ORTHOPAEDIC CONSULTATION  REQUESTING PHYSICIAN: Demetrios Loll, MD  Chief Complaint: Left second toe infection  HPI: Joann [name] is a 73 y.o. female who complains of infection left second toe.  She was seen in the outpatient clinic yesterday with a noted infection to the second toe.  Obvious osteomyelitis on x-ray and patient referred to hospital for IV antibiotics and likely surgical debridement  Past Medical History:  Diagnosis Date  . Breast cancer ([place]) 2007   right breast lumpectomy with 36 rad tx  . Breast mass 11/23/2016   6 o'clock  . Diabetes mellitus without complication ([place])   . Hypertension    Past Surgical History:  Procedure Laterality Date  . BREAST BIOPSY Right 2007   positive.  Lumpectomy with rad tx  . BREAST LUMPECTOMY Right 2007  . INCISION AND DRAINAGE Left 10/16/2016   Procedure: INCISION AND DRAINAGE;  Surgeon: Albertine Patricia, DPM;  Location: [facility] ORS;  Service: Podiatry;  Laterality: Left;   Social History   Socioeconomic History  . Marital status: Married    Spouse name: Not on file  . Number of children: Not on file  . Years of education: Not on file  . Highest education level: Not on file  Occupational History  . Not on file  Social Needs  . Financial resource strain: Not on file  . Food insecurity    Worry: Not on file    Inability: Not on file  . Transportation needs    Medical: Not on file    Non-medical: Not on file  Tobacco Use  . Smoking status: Never Smoker  . Smokeless tobacco: Never Used  Substance and Sexual Activity  . Alcohol use: No  . Drug use: No  . Sexual activity: Not on file  Lifestyle  . Physical activity    Days per week: Not on file    Minutes per session: Not on file  . Stress: Not on file  Relationships  . Social Herbalist on phone: Not on file    Gets together: Not on file    Attends religious service: Not on file    Active member of club or organization: Not on file    Attends meetings of  clubs or organizations: Not on file    Relationship status: Not on file  Other Topics Concern  . Not on file  Social History Narrative  . Not on file   Family History  Problem Relation Age of Onset  . Dementia Mother   . CAD Mother   . CAD Father   . Deep vein thrombosis Father   . Breast cancer Neg Hx    Allergies  Allergen Reactions  . Neosporin [Neomycin-Bacitracin Zn-Polymyx]     "it just don't agree with me"   . Penicillins Itching    Has patient had a PCN reaction causing immediate rash, facial/tongue/throat swelling, SOB or lightheadedness with hypotension: no Has patient had a PCN reaction causing severe rash involving mucus membranes or skin necrosis: no Has patient had a PCN reaction that required hospitalization no Has patient had a PCN reaction occurring within the last 10 years:no If all of the above answers are "NO", then may proceed with Cephalosporin use.   . Statins     hallucinations   Prior to Admission medications   Medication Sig Start Date End Date Taking? Authorizing Provider  aspirin EC 81 MG tablet Take 81 mg by mouth daily.   Yes [provider]  hydrochlorothiazide (HYDRODIURIL)  12.5 MG tablet Take 12.5 mg by mouth every evening. 10/12/16  Yes [provider]  lisinopril (PRINIVIL,ZESTRIL) 10 MG tablet Take 10 mg by mouth daily. 07/30/16  Yes [provider]  metFORMIN (GLUCOPHAGE) 500 MG tablet Take 500 mg by mouth 2 (two) times daily. 09/30/16  Yes [provider]   Mr Foot Left W Wo Contrast  Result Date: 02/22/2019 CLINICAL DATA:  History of breast cancer.  Left foot infection. EXAM: MRI OF THE LEFT FOREFOOT WITHOUT AND WITH CONTRAST TECHNIQUE: Multiplanar, multisequence MR imaging of the left forefoot was performed both before and after administration of intravenous contrast. CONTRAST:  9 mL Gadavist COMPARISON:  Foot x-ray 10/15/2016 FINDINGS: Bones/Joint/Cartilage Soft tissue ulcer involving the second toe.  Extensive bone destruction of second proximal and middle phalanx. Small amount of joint fluid in the second DIP joint with bone marrow edema at the base of the second distal phalanx which may reflect septic arthritis. Small joint effusion in the second MTP joint with bone marrow edema in the second metatarsal head concerning for septic arthritis. Abnormal phlegmonous inflammatory changes surrounding the second toe. Nonenhancing soft tissue along the plantar aspect of the second proximal phalanx and extending more laterally in the forefoot likely reflecting areas of fibrosis/callus. No drainable fluid collection. Severe soft tissue edema along the dorsal aspect of the midfoot and forefoot consistent with cellulitis. No acute fracture or dislocation. Normal alignment. 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 Ligaments Collateral ligaments are intact.  Lisfranc ligament is intact. Muscles and Tendons Flexor, peroneal and extensor compartment tendons are intact. Muscle edema throughout the plantar musculature. This may reflect an element of neurogenic edema versus myositis. Soft tissue No fluid collection or hematoma.  No soft tissue mass. IMPRESSION: 1. Soft tissue ulcer involving the second toe. Extensive osteomyelitis with bone destruction of the second proximal and middle phalanx. Septic arthritis of the second MTP joint and second DIP joint. Phlegmonous changes surrounding the second toe and cellulitis of the dorsal aspect of the midfoot and forefoot. 2. Muscle edema throughout the plantar musculature. This may reflect an element of neurogenic edema versus infectious myositis given that is contiguous with the abnormality in the second toe. Electronically Signed   By: Kathreen Devoid   On: 02/22/2019 07:47    Positive ROS: All other systems have been reviewed and were otherwise negative with the exception of those mentioned in the HPI and as above.  12 point ROS was  performed.  Physical Exam: General: Alert and oriented.  No apparent distress.  Vascular:  Left foot:Dorsalis Pedis:  present Posterior Tibial:  present  Right foot: Dorsalis Pedis:  present Posterior Tibial:  present  Neuro:absent protective sensation  Derm: Full-thickness ulceration to the plantar left second toe at the PIPJ is noted.  Mild erythema.  No active purulent drainage at this time.  Ulcer does probe deep into bone and joint as found in the outpatient clinic.  Ortho/MS: Diffuse edema to the left foot and ankle.  Right foot and ankle without issue.  Assessment: Osteomyelitis right second toe Diabetes with neuropathy  Plan: The MRI was consistent with osteomyelitis of the toe but no osteomyelitis was obvious to the second metatarsal itself.  The plan will be amputation of the left second toe.  I discussed this with the patient in great detail.  I discussed we may need to remove some bone behind the second toe if needed and grossly there is obvious infection.  She understands this.  I discussed the risk benefits alternatives and complications associated with the surgery and consent has been given.  Plan to perform tomorrow.  Orders will be placed today.    Elesa Hacker, DPM Cell [phone]   02/22/2019 1:05 PM   Addendume performed and corrected toe. Initially placed as right 2nd toe amputation but correct side is left. JF

## 2019-02-23 ENCOUNTER — Encounter: Payer: Self-pay | Admitting: Anesthesiology

## 2019-02-23 ENCOUNTER — Encounter
Admission: AD | Disposition: A | Discharge status: Home / Self Care | Payer: Self-pay | Source: Ambulatory Visit | Attending: Internal Medicine

## 2019-02-23 ENCOUNTER — Inpatient Hospital Stay: Payer: Medicare Other | Admitting: Anesthesiology

## 2019-02-23 HISTORY — PX: AMPUTATION: SHX166

## 2019-02-23 LAB — GLUCOSE, CAPILLARY
Glucose-Capillary: 112 mg/dL — ABNORMAL HIGH (ref 70–99)
Glucose-Capillary: 94 mg/dL (ref 70–99)
Glucose-Capillary: 119 mg/dL — ABNORMAL HIGH (ref 70–99)
Glucose-Capillary: 127 mg/dL — ABNORMAL HIGH (ref 70–99)
Glucose-Capillary: 125 mg/dL — ABNORMAL HIGH (ref 70–99)
Glucose-Capillary: 117 mg/dL — ABNORMAL HIGH (ref 70–99)
Glucose-Capillary: 100 mg/dL — ABNORMAL HIGH (ref 70–99)
Glucose-Capillary: 110 mg/dL — ABNORMAL HIGH (ref 70–99)
Glucose-Capillary: 131 mg/dL — ABNORMAL HIGH (ref 70–99)
Glucose-Capillary: 189 mg/dL — ABNORMAL HIGH (ref 70–99)

## 2019-02-23 LAB — CREATININE, SERUM
Creatinine, Ser: 0.95 mg/dL (ref 0.44–1.00)
GFR calc Af Amer: >60 mL/min (ref >60)
GFR calc non Af Amer: 59 mL/min — ABNORMAL LOW (ref >60)

## 2019-02-23 SURGERY — AMPUTATION, FOOT, RAY
Anesthesia: General | Laterality: Left

## 2019-02-23 MED ORDER — FENTANYL CITRATE (PF) 100 MCG/2ML IJ SOLN
INTRAMUSCULAR | Status: AC
  Filled 2019-02-23: qty 2

## 2019-02-23 MED ORDER — LIDOCAINE HCL (CARDIAC) PF 100 MG/5ML IV SOSY
PREFILLED_SYRINGE | INTRAVENOUS | Status: DC | PRN
  Administered 2019-02-23: 100 mg via INTRAVENOUS

## 2019-02-23 MED ORDER — PROPOFOL 10 MG/ML IV BOLUS
INTRAVENOUS | Status: AC
  Filled 2019-02-23: qty 20

## 2019-02-23 MED ORDER — LIDOCAINE HCL (PF) 2 % IJ SOLN
INTRAMUSCULAR | Status: AC
  Filled 2019-02-23: qty 10

## 2019-02-23 MED ORDER — DEXAMETHASONE SODIUM PHOSPHATE 10 MG/ML IJ SOLN
INTRAMUSCULAR | Status: AC
  Filled 2019-02-23: qty 1

## 2019-02-23 MED ORDER — PHENYLEPHRINE HCL (PRESSORS) 10 MG/ML IV SOLN
INTRAVENOUS | Status: DC | PRN
  Administered 2019-02-23: 150 ug via INTRAVENOUS
  Administered 2019-02-23: 100 ug via INTRAVENOUS
  Administered 2019-02-23: 200 ug via INTRAVENOUS
  Administered 2019-02-23: 150 ug via INTRAVENOUS
  Administered 2019-02-23: 100 ug via INTRAVENOUS

## 2019-02-23 MED ORDER — ONDANSETRON HCL 4 MG/2ML IJ SOLN
INTRAMUSCULAR | Status: AC
  Filled 2019-02-23: qty 2

## 2019-02-23 MED ORDER — GLYCOPYRROLATE 0.2 MG/ML IJ SOLN
INTRAMUSCULAR | Status: AC
  Filled 2019-02-23: qty 1

## 2019-02-23 MED ORDER — LIDOCAINE HCL (PF) 1 % IJ SOLN
INTRAMUSCULAR | Status: DC | PRN
  Administered 2019-02-23: 5 mL

## 2019-02-23 MED ORDER — CHLORHEXIDINE GLUCONATE 4 % EX LIQD
60.0000 mL | Freq: Once | CUTANEOUS | Status: AC
  Administered 2019-02-23: 4 via TOPICAL

## 2019-02-23 MED ORDER — MIDAZOLAM HCL 2 MG/2ML IJ SOLN
INTRAMUSCULAR | Status: DC | PRN
  Administered 2019-02-23: 1 mg via INTRAVENOUS

## 2019-02-23 MED ORDER — ENSURE ENLIVE PO LIQD: 296.0000 mL | Freq: Once | ORAL | Status: DC

## 2019-02-23 MED ORDER — OXYCODONE HCL 5 MG PO TABS: 5.0000 mg | ORAL_TABLET | Freq: Once | ORAL | Status: DC | PRN

## 2019-02-23 MED ORDER — FENTANYL CITRATE (PF) 100 MCG/2ML IJ SOLN
INTRAMUSCULAR | Status: DC | PRN
  Administered 2019-02-23 (×2): 25 ug via INTRAVENOUS

## 2019-02-23 MED ORDER — DEXAMETHASONE SODIUM PHOSPHATE 10 MG/ML IJ SOLN
INTRAMUSCULAR | Status: DC | PRN
  Administered 2019-02-23: 5 mg via INTRAVENOUS

## 2019-02-23 MED ORDER — PROPOFOL 10 MG/ML IV BOLUS
INTRAVENOUS | Status: DC | PRN
  Administered 2019-02-23: 150 mg via INTRAVENOUS

## 2019-02-23 MED ORDER — FENTANYL CITRATE (PF) 100 MCG/2ML IJ SOLN: 25.0000 ug | INTRAMUSCULAR | Status: DC | PRN

## 2019-02-23 MED ORDER — MIDAZOLAM HCL 2 MG/2ML IJ SOLN
INTRAMUSCULAR | Status: AC
  Filled 2019-02-23: qty 2

## 2019-02-23 MED ORDER — GLYCOPYRROLATE 0.2 MG/ML IJ SOLN
INTRAMUSCULAR | Status: DC | PRN
  Administered 2019-02-23: 0.2 mg via INTRAVENOUS

## 2019-02-23 MED ORDER — BUPIVACAINE HCL 0.5 % IJ SOLN
INTRAMUSCULAR | Status: DC | PRN
  Administered 2019-02-23: 5 mL

## 2019-02-23 MED ORDER — ONDANSETRON HCL 4 MG/2ML IJ SOLN
INTRAMUSCULAR | Status: DC | PRN
  Administered 2019-02-23: 4 mg via INTRAVENOUS

## 2019-02-23 MED ORDER — OXYCODONE HCL 5 MG/5ML PO SOLN: 5.0000 mg | Freq: Once | ORAL | Status: DC | PRN

## 2019-02-23 SURGICAL SUPPLY — 47 items
BLADE OSC/SAGITTAL MD 5.5X18 (BLADE) ×2 IMPLANT
BLADE SURG MINI STRL (BLADE) ×2 IMPLANT
BNDG CONFORM 2 STRL LF (GAUZE/BANDAGES/DRESSINGS) ×2 IMPLANT
BNDG CONFORM 3 STRL LF (GAUZE/BANDAGES/DRESSINGS) ×4 IMPLANT
BNDG ELASTIC 4X5.8 VLCR NS LF (GAUZE/BANDAGES/DRESSINGS) ×2 IMPLANT
BNDG ESMARK 4X12 TAN STRL LF (GAUZE/BANDAGES/DRESSINGS) ×2 IMPLANT
BNDG GAUZE 4.5X4.1 6PLY STRL (MISCELLANEOUS) ×2 IMPLANT
CANISTER SUCT 1200ML W/VALVE (MISCELLANEOUS) ×2 IMPLANT
CNTNR SPEC 2.5X3XGRAD LEK (MISCELLANEOUS) ×1
CONT SPEC 4OZ STER OR WHT (MISCELLANEOUS) ×1
CONTAINER SPEC 2.5X3XGRAD LEK (MISCELLANEOUS) ×1 IMPLANT
COVER WAND RF STERILE (DRAPES) ×2 IMPLANT
CUFF TOURN SGL QUICK 12 (TOURNIQUET CUFF) ×2 IMPLANT
CUFF TOURN SGL QUICK 18X4 (TOURNIQUET CUFF) IMPLANT
DRAPE FLUOR MINI C-ARM 54X84 (DRAPES) ×2 IMPLANT
DRAPE XRAY CASSETTE 23X24 (DRAPES) ×2 IMPLANT
DURAPREP 26ML APPLICATOR (WOUND CARE) ×2 IMPLANT
ELECT REM PT RETURN 9FT ADLT (ELECTROSURGICAL) ×2
ELECTRODE REM PT RTRN 9FT ADLT (ELECTROSURGICAL) ×1 IMPLANT
GAUZE PACKING IODOFORM 1/2 (PACKING) IMPLANT
GAUZE SPONGE 4X4 12PLY STRL (GAUZE/BANDAGES/DRESSINGS) ×2 IMPLANT
GAUZE XEROFORM 1X8 LF (GAUZE/BANDAGES/DRESSINGS) ×2 IMPLANT
GLOVE BIO SURGEON STRL SZ7.5 (GLOVE) ×4 IMPLANT
GLOVE INDICATOR 8.0 STRL GRN (GLOVE) ×4 IMPLANT
GOWN STRL REUS W/ TWL LRG LVL3 (GOWN DISPOSABLE) ×2 IMPLANT
GOWN STRL REUS W/TWL LRG LVL3 (GOWN DISPOSABLE) ×2
HEMOSTAT SURGICEL 2X3 (HEMOSTASIS) ×2 IMPLANT
KIT TURNOVER KIT A (KITS) ×2 IMPLANT
LABEL OR SOLS (LABEL) ×2 IMPLANT
NEEDLE FILTER BLUNT 18X 1/2SAF (NEEDLE)
NEEDLE FILTER BLUNT 18X1 1/2 (NEEDLE) IMPLANT
NEEDLE HYPO 25X1 1.5 SAFETY (NEEDLE) ×2 IMPLANT
NS IRRIG 500ML POUR BTL (IV SOLUTION) ×2 IMPLANT
PACK EXTREMITY ARMC (MISCELLANEOUS) ×2 IMPLANT
PAD ABD DERMACEA PRESS 5X9 (GAUZE/BANDAGES/DRESSINGS) ×4 IMPLANT
PULSAVAC PLUS IRRIG FAN TIP (DISPOSABLE)
SHIELD FULL FACE ANTIFOG 7M (MISCELLANEOUS) ×2 IMPLANT
SOL .9 NS 3000ML IRR  AL (IV SOLUTION)
SOL .9 NS 3000ML IRR UROMATIC (IV SOLUTION) IMPLANT
STOCKINETTE M/LG 89821 (MISCELLANEOUS) ×2 IMPLANT
STRAP SAFETY 5IN WIDE (MISCELLANEOUS) ×2 IMPLANT
SUT ETHILON 3-0 FS-10 30 BLK (SUTURE) ×2
SUT ETHILON 5-0 FS-2 18 BLK (SUTURE) ×2 IMPLANT
SUT VIC AB 4-0 FS2 27 (SUTURE) ×2 IMPLANT
SUTURE EHLN 3-0 FS-10 30 BLK (SUTURE) ×1 IMPLANT
SYR 10ML LL (SYRINGE) ×6 IMPLANT
TIP FAN IRRIG PULSAVAC PLUS (DISPOSABLE) IMPLANT

## 2019-02-23 NOTE — Anesthesia Procedure Notes (Signed)
Procedure Name: LMA Insertion Date/Time: 02/23/2019 1:19 PM Performed by: Lavone Orn, CRNA Pre-anesthesia Checklist: Patient identified, Emergency Drugs available, Suction available, Patient being monitored and Timeout performed Patient Re-evaluated:Patient Re-evaluated prior to induction Oxygen Delivery Method: Circle system utilized Preoxygenation: Pre-oxygenation with 100% oxygen Induction Type: IV induction Ventilation: Mask ventilation without difficulty LMA: LMA inserted LMA Size: 4.0 Number of attempts: 1 Placement Confirmation: positive ETCO2 and breath sounds checked- equal and bilateral Tube secured with: Tape Dental Injury: Teeth and Oropharynx as per pre-operative assessment

## 2019-02-23 NOTE — Consult Note (Signed)
Pharmacy Antibiotic Note  Joann [name] is a 73 y.o. female admitted on 02/21/2019 with possible osteomyelitis.  Pharmacy has been consulted for vancomycin and cefepime dosing. She has a penicillin allergy which she confirms to me but has tolerated cephalosporins in the recent past.  Plan:  1) vancomycin 750 mg IV Q 24 hrs following a 1500 mg loading dose Goal AUC 400-550 Expected AUC: 455 SCr used: 0.97 mg/dL BMI 33.1 T1/2: 17.9 h Css min: 11.9 mcg/mL  2) Cefepime 2 g IV q12h.  Renal function stable, continue to monitor renal function. Will order SCr for AM.    Temp (24hrs), Avg:98.1 F (36.7 C), Min:98 F (36.7 C), Max:98.2 F (36.8 C)  Recent Labs  Lab 02/21/19 1301 02/22/19 0621 02/23/19 0458  WBC  --  10.4  --   CREATININE 0.97 0.94 0.95    Estimated Creatinine Clearance: 52.3 mL/min (by C-G formula based on SCr of 0.95 mg/dL).    Allergies  Allergen Reactions  . Neosporin [Neomycin-Bacitracin Zn-Polymyx]     "it just don't agree with me"   . Penicillins Itching    Has patient had a PCN reaction causing immediate rash, facial/tongue/throat swelling, SOB or lightheadedness with hypotension: no Has patient had a PCN reaction causing severe rash involving mucus membranes or skin necrosis: no Has patient had a PCN reaction that required hospitalization no Has patient had a PCN reaction occurring within the last 10 years:no If all of the above answers are "NO", then may proceed with Cephalosporin use.   . Statins     hallucinations    Antimicrobials this admission: vancomycin 7/29 >> cefepime 7/29 >>  metronidazole 7/29 >>  Microbiology results: 7/29 SARS CoV-2: negative   Thank you for allowing pharmacy to be a part of this patient's care.  Rocky Morel, PharmD, BCPS 02/23/2019 12:43 PM

## 2019-02-23 NOTE — Care Management Important Message (Signed)
Important Message  Patient Details  Name: Joann [name] MRN: [id number] Date of Birth: 1945/08/24   Medicare Important Message Given:  Yes     Dannette Inger 02/23/2019, 10:52 AM

## 2019-02-23 NOTE — Op Note (Signed)
Operative note   Surgeon:Aarion Kittrell Lawyer: None    Preop diagnosis: Osteomyelitis left second toe    Postop diagnosis: Same    Procedure: Amputation left second toe MTPJ    EBL: Minimal    Anesthesia:local and general.  Local consisted of a one-to-one mixture of 0.5% bupivacaine and 1% lidocaine plain    Hemostasis: None    Specimen: Bone for pathology and bone for culture    Complications: None    Operative indications:Joann [name] is an 73 y.o. that presents today for surgical intervention.  The risks/benefits/alternatives/complications have been discussed and consent has been given.    Procedure:  Patient was brought into the OR and placed on the operating table in thesupine position. After anesthesia was obtained theleft lower extremity was prepped and draped in usual sterile fashion.  Attention was directed to the left second toe where 2 semielliptical incisions were made at the MTPJ.  Full-thickness incision was taken down to the joint.  The toe was disarticulated.  Severe destruction of the proximal phalanx was noted to the articular cartilage of the base of the proximal phalanx.  The head of the metatarsal head appeared to be intact without fragmentation or signs of deep infection.  The wound was flushed with copious amounts of irrigation.  All bleeders were Bovie cauterized.  Further obvious infected tissue was noted.  The skin was then closed with a 3-0 nylon.  Bulky sterile dressing was applied.  A small portion of the incision was left open for drainage.    Patient tolerated the procedure and anesthesia well.  Was transported from the OR to the PACU with all vital signs stable and vascular status intact. To be discharged per routine protocol.  Will follow up in approximately 1 week in the outpatient clinic.

## 2019-02-23 NOTE — Progress Notes (Signed)
[facility] [place] NAME: Joann [name]    MR#:  [id number]  DATE OF BIRTH:  [birth year]  SUBJECTIVE:  CHIEF COMPLAINT:  No chief complaint on file.  The patient has no complaints. REVIEW OF SYSTEMS:  Review of Systems  Constitutional: Negative for chills, fever and malaise/fatigue.  HENT: Negative for sore throat.   Eyes: Negative for blurred vision and double vision.  Respiratory: Negative for cough, hemoptysis, shortness of breath, wheezing and stridor.   Cardiovascular: Negative for chest pain, palpitations, orthopnea and leg swelling.  Gastrointestinal: Negative for abdominal pain, blood in stool, diarrhea, melena, nausea and vomiting.  Genitourinary: Negative for dysuria, flank pain and hematuria.  Musculoskeletal: Negative for back pain and joint pain.  Skin: Negative for rash.  Neurological: Negative for dizziness, sensory change, focal weakness, seizures, loss of consciousness, weakness and headaches.  Endo/Heme/Allergies: Negative for polydipsia.  Psychiatric/Behavioral: Negative for depression. The patient is not nervous/anxious.     DRUG ALLERGIES:   Allergies  Allergen Reactions  . Neosporin [Neomycin-Bacitracin Zn-Polymyx]     "it just don't agree with me"   . Penicillins Itching    Has patient had a PCN reaction causing immediate rash, facial/tongue/throat swelling, SOB or lightheadedness with hypotension: no Has patient had a PCN reaction causing severe rash involving mucus membranes or skin necrosis: no Has patient had a PCN reaction that required hospitalization no Has patient had a PCN reaction occurring within the last 10 years:no If all of the above answers are "NO", then may proceed with Cephalosporin use.   . Statins     hallucinations   VITALS:  Blood pressure (!) 101/55, pulse 84, temperature 98.2 F (36.8 C), temperature source Oral, resp. rate 20, height 5\' 2"  (1.575 m), weight 86.5 kg, SpO2 96 %.  PHYSICAL EXAMINATION:  Physical Exam Constitutional:      General: She is not in acute distress.    Appearance: Normal appearance. She is obese.  HENT:     Head: Normocephalic.     Mouth/Throat:     Mouth: Mucous membranes are moist.  Eyes:     General: No scleral icterus.    Conjunctiva/sclera: Conjunctivae normal.     Pupils: Pupils are equal, round, and reactive to light.  Neck:     Musculoskeletal: Normal range of motion and neck supple.     Vascular: No JVD.     Trachea: No tracheal deviation.  Cardiovascular:     Rate and Rhythm: Normal rate and regular rhythm.     Heart sounds: Normal heart sounds. No murmur. No gallop.   Pulmonary:     Effort: Pulmonary effort is normal. No respiratory distress.     Breath sounds: Normal breath sounds. No wheezing or rales.  Abdominal:     General: Bowel sounds are normal. There is no distension.     Palpations: Abdomen is soft.     Tenderness: There is no abdominal tenderness. There is no rebound.  Musculoskeletal: Normal range of motion.        General: No tenderness.     Right lower leg: No edema.     Left lower leg: No edema.     Comments: Left foot in dressing.  Skin:    Findings: No erythema or rash.  Neurological:     General: No focal deficit present.     Mental Status: She is alert and oriented to person, place, and time.     Cranial Nerves: No cranial  nerve deficit.  Psychiatric:        Mood and Affect: Mood normal.    LABORATORY PANEL:  Female CBC Recent Labs  Lab 02/22/19 0621  WBC 10.4  HGB 11.7*  HCT 36.5  PLT 364   ------------------------------------------------------------------------------------------------------------------ Chemistries  Recent Labs  Lab 02/21/19 1301 02/22/19 0621 02/23/19 0458  NA 139 135  --   K 4.1 3.6  --   CL 105 103  --   CO2 25 25  --   GLUCOSE 99 136*  --   BUN 15 16  --   CREATININE 0.97 0.94 0.95  CALCIUM 9.4 8.6*  --   AST 21  --   --   ALT 16  --   --    ALKPHOS 96  --   --   BILITOT 0.7  --   --    RADIOLOGY:  No results found. ASSESSMENT AND PLAN:   Left foot infection with osteomyelitis and septic arthritis. MRI:  1. Soft tissue ulcer involving the second toe. Extensive osteomyelitis with bone destruction of the second proximal and middle phalanx. Septic arthritis of the second MTP joint and second DIP joint. Phlegmonous changes surrounding the second toe and cellulitis of the dorsal aspect of the midfoot and forefoot. 2. Muscle edema throughout the plantar musculature. This may reflect an element of neurogenic edema versus infectious myositis given that is contiguous with the abnormality in the second toe. Surgery with amputation of second toe today per Dr. Vickki Muff.   Continue cefepime and vancomycin. Marland Kitchen Hypertension.  Continue home hypertension medication. Diabetes.  Continue sliding scale.  Hold metformin.  All the records are reviewed and case discussed with Care Management/Social Worker. Management plans discussed with the patient, family and they are in agreement.  CODE STATUS: DNR  TOTAL TIME TAKING CARE OF THIS PATIENT: 27 minutes.   More than 50% of the time was spent in counseling/coordination of care: YES  POSSIBLE D/C IN 2 DAYS, DEPENDING ON CLINICAL CONDITION.   Demetrios Loll M.D on 02/23/2019 at 12:24 PM  Between 7am to 6pm - Pager - [phone]  After 6pm go to [email or web address] - [facility]

## 2019-02-23 NOTE — Transfer of Care (Signed)
Immediate Anesthesia Transfer of Care Note  Patient: Joann [name]  Procedure(s) Performed: AMPUTATION LEFT 2nd Toe (Left )  Patient Location: PACU  Anesthesia Type:General  Level of Consciousness: awake and drowsy  Airway & Oxygen Therapy: Patient Spontanous Breathing and Patient connected to face mask oxygen  Post-op Assessment: Report given to RN and Post -op Vital signs reviewed and stable  Post vital signs: stable  Last Vitals:  Vitals Value Taken Time  BP 123/65 02/23/19 1358  Temp 36.9 C 02/23/19 1358  Pulse 85 02/23/19 1406  Resp 22 02/23/19 1406  SpO2 100 % 02/23/19 1406  Vitals shown include unvalidated device data.  Last Pain:  Vitals:   02/23/19 1358  TempSrc:   PainSc: 0-No pain         Complications: No apparent anesthesia complications

## 2019-02-23 NOTE — Anesthesia Postprocedure Evaluation (Signed)
Anesthesia Post Note  Patient: Joann [name]  Procedure(s) Performed: AMPUTATION LEFT 2nd Toe (Left )  Patient location during evaluation: PACU Anesthesia Type: General Level of consciousness: awake and alert Pain management: pain level controlled Vital Signs Assessment: post-procedure vital signs reviewed and stable Respiratory status: spontaneous breathing, nonlabored ventilation, respiratory function stable and patient connected to nasal cannula oxygen Cardiovascular status: blood pressure returned to baseline and stable Postop Assessment: no apparent nausea or vomiting Anesthetic complications: no     Last Vitals:  Vitals:   02/23/19 1418 02/23/19 1427  BP: 113/73 124/72  Pulse: 88 86  Resp: 16 15  Temp:  36.6 C  SpO2: 96% 98%    Last Pain:  Vitals:   02/23/19 1427  TempSrc: Oral  PainSc: 0-No pain                 Precious Haws Piscitello

## 2019-02-23 NOTE — Anesthesia Preprocedure Evaluation (Addendum)
Anesthesia Evaluation  Patient identified by MRN, date of birth, ID band Patient awake    Reviewed: Allergy & Precautions, H&P , NPO status , Patient's Chart, lab work & pertinent test results  History of Anesthesia Complications Negative for: history of anesthetic complications  Airway Mallampati: III  TM Distance: <3 FB Neck ROM: limited    Dental  (+) Chipped, Poor Dentition   Pulmonary neg pulmonary ROS, neg shortness of breath,           Cardiovascular Exercise Tolerance: Good hypertension, (-) angina(-) Past MI and (-) DOE      Neuro/Psych negative neurological ROS  negative psych ROS   GI/Hepatic negative GI ROS, Neg liver ROS, neg GERD  ,  Endo/Other  diabetes, Type 2  Renal/GU      Musculoskeletal   Abdominal   Peds  Hematology negative hematology ROS (+)   Anesthesia Other Findings Past Medical History: 2007: Breast cancer ([place])     Comment:  right breast lumpectomy with 36 rad tx 11/23/2016: Breast mass     Comment:  6 o'clock No date: Diabetes mellitus without complication ([place]) No date: Hypertension  Past Surgical History: 2007: BREAST BIOPSY; Right     Comment:  positive.  Lumpectomy with rad tx 2007: BREAST LUMPECTOMY; Right 10/16/2016: INCISION AND DRAINAGE; Left     Comment:  Procedure: INCISION AND DRAINAGE;  Surgeon: Albertine Patricia, DPM;  Location: [facility] ORS;  Service: Podiatry;                Laterality: Left;  BMI    Body Mass Index: 34.90 kg/m      Reproductive/Obstetrics negative OB ROS                             Anesthesia Physical Anesthesia Plan  ASA: III  Anesthesia Plan: General LMA   Post-op Pain Management:    Induction: Intravenous  PONV Risk Score and Plan: Dexamethasone, Ondansetron, Midazolam and Treatment may vary due to age or medical condition  Airway Management Planned: LMA  Additional Equipment:   Intra-op  Plan:   Post-operative Plan: Extubation in OR  Informed Consent: I have reviewed the patients History and Physical, chart, labs and discussed the procedure including the risks, benefits and alternatives for the proposed anesthesia with the patient or authorized representative who has indicated his/her understanding and acceptance.   Patient has DNR.  Discussed DNR with patient and Suspend DNR.   Dental Advisory Given  Plan Discussed with: Anesthesiologist, CRNA and Surgeon  Anesthesia Plan Comments: (Patient consented for risks of anesthesia including but not limited to:  - adverse reactions to medications - damage to teeth, lips or other oral mucosa - sore throat or hoarseness - Damage to heart, brain, lungs or loss of life  Patient voiced understanding.)       Anesthesia Quick Evaluation

## 2019-02-23 NOTE — Anesthesia Post-op Follow-up Note (Signed)
Anesthesia QCDR form completed.        

## 2019-02-24 LAB — GLUCOSE, CAPILLARY
Glucose-Capillary: 131 mg/dL — ABNORMAL HIGH (ref 70–99)
Glucose-Capillary: 109 mg/dL — ABNORMAL HIGH (ref 70–99)
Glucose-Capillary: 109 mg/dL — ABNORMAL HIGH (ref 70–99)
Glucose-Capillary: 115 mg/dL — ABNORMAL HIGH (ref 70–99)

## 2019-02-24 LAB — CBC
HCT: 37 % (ref 36.0–46.0)
Hemoglobin: 11.9 g/dL — ABNORMAL LOW (ref 12.0–15.0)
MCH: 29.2 pg (ref 26.0–34.0)
MCHC: 32.2 g/dL (ref 30.0–36.0)
MCV: 90.9 fL (ref 80.0–100.0)
Platelets: 385 10*3/uL (ref 150–400)
RBC: 4.07 MIL/uL (ref 3.87–5.11)
RDW: 12.8 % (ref 11.5–15.5)
WBC: 10.7 10*3/uL — ABNORMAL HIGH (ref 4.0–10.5)
nRBC: 0 % (ref 0.0–0.2)

## 2019-02-24 LAB — CREATININE, SERUM
Creatinine, Ser: 1 mg/dL (ref 0.44–1.00)
GFR calc Af Amer: >60 mL/min (ref >60)
GFR calc non Af Amer: 55 mL/min — ABNORMAL LOW (ref >60)

## 2019-02-24 MED ORDER — CEFAZOLIN SODIUM-DEXTROSE 2-4 GM/100ML-% IV SOLN
2.0000 g | Freq: Three times a day (TID) | INTRAVENOUS | Status: DC
  Administered 2019-02-24 – 2019-02-25 (×2): 2 g via INTRAVENOUS
  Filled 2019-02-24 (×6): qty 100

## 2019-02-24 NOTE — Evaluation (Addendum)
Physical Therapy Evaluation Patient Details Name: Joann [name] MRN: [id number] DOB: 13-May-1946 Today's Date: 02/24/2019   History of Present Illness  Joann [name] is a 73 y.o. female who was directly admitted to the hospital on 02/21/2019 due to left foot infection. She was found to have osteomyelitis of lesser toe on the L foot and underwent amputation of the left second to MTPJ on 02/22/2019. Relevant PMH includes breast cancer, HTN, DM. She may weight bear to L heel with steps with L post-op shoe applied at all times.    Clinical Impression  Patient is A&Ox4 and able to provide a detailed history. She reports that prior to hospitalization she was I with all aspects of care and mobility. She used a walking stick following previous surgeries but does not have any other DME at home. She lives with her husband on the main floor of a farm house with 2 steps to enter and R hand rail. Upon physical therapy evaluation, patient was I with bed mobility, required supervision with cuing for sit <> stand transfers to RW, and supervision for ambulation 250 feet with RW. Patient failed to use proper hand placement and technique with RW during transfers and ambulation that improved with cuing but would benefit from further enforcement. Patient completed 2 steps with R handrail and CGA for safety. Patient was able to ambulate with SPC and CGA for 20 feet but stated she felt much safer with RW. Recommend patient discharge home with home health PT, RW, and BSC. Patient would benefit from physical therapy to address impairments and functional limitations (see PT Problem List) to work towards stated goals and return to PLOF or maximal functional independence.      Follow Up Recommendations Home health PT    Equipment Recommendations  Rolling walker with 5" wheels;3in1 (PT)    Recommendations for Other Services       Precautions / Restrictions Precautions Precautions: Other (comment) Precaution Comments: WB to  heel with steps L post op shoe at all times Required Braces or Orthoses: Other Brace Other Brace: L post-op shoe Restrictions Weight Bearing Restrictions: Yes Other Position/Activity Restrictions: WB to heel with steps L post-op shoe at all times      Mobility  Bed Mobility Overal bed mobility: Independent             General bed mobility comments: supine to sit  Transfers Overall transfer level: Needs assistance Equipment used: Rolling walker (2 wheeled) Transfers: Sit to/from Stand Sit to Stand: Supervision         General transfer comment: Patient required cuing for proper hand placement and use of RW. Reports she feels safer with walker and would like to use it.  Ambulation/Gait Ambulation/Gait assistance: Supervision Gait Distance (Feet): 260 Feet Assistive device: Rolling walker (2 wheeled) Gait Pattern/deviations: Decreased stance time - left;Decreased step length - right;Wide base of support;Antalgic Gait velocity: slow but functional   General Gait Details: Patient ambulated ~ 260 feet with RW and supervision, then 20 feet with SPC in R UE with CGA for safety. Pateint stated she felt most comfortable with RW but required additional cuing for safe positioning of the walker.  Stairs Stairs: Yes Stairs assistance: Min guard Stair Management: One rail Right Number of Stairs: 2 General stair comments: Patient descended and ascended 2 steps with R handrail and CGA for safety.  Wheelchair Mobility    Modified Rankin (Stroke Patients Only)       Balance Overall balance assessment: Needs assistance Sitting-balance support:  No upper extremity supported;Feet supported Sitting balance-Leahy Scale: Normal Sitting balance - Comments: steady sitting at edge of bed, able to don shoes   Standing balance support: During functional activity;Single extremity supported Standing balance-Leahy Scale: Good Standing balance comment: Patient able to complete static  balance without support and use SPC with CGA for safety during ambulation, but feels much safer with RW.                             Pertinent Vitals/Pain Pain Assessment: No/denies pain    Home Living Family/patient expects to be discharged to:: Private residence Living Arrangements: Spouse/significant other Available Help at Discharge: Family Type of Home: House(lives on a farm) Home Access: Stairs to enter Entrance Stairs-Rails: Right Entrance Stairs-Number of Steps: 2 Home Layout: One level Home Equipment: Other (comment)(walking stick) Additional Comments: patient lives on main floor. laundry in basement    Prior Function Level of Independence: Independent         Comments: Pateint reports she is I with all aspects of care and mobility including driving     Hand Dominance        Extremity/Trunk Assessment   Upper Extremity Assessment Upper Extremity Assessment: Overall WFL for tasks assessed    Lower Extremity Assessment Lower Extremity Assessment: RLE deficits/detail RLE Deficits / Details: foot bandaged and only able to weight bear with post-op shoe donned. Leg strength is WFL but balance is affected by dressings and shoe. RLE: Unable to fully assess due to immobilization    Cervical / Trunk Assessment Cervical / Trunk Assessment: Normal  Communication   Communication: No difficulties  Cognition Arousal/Alertness: Awake/alert Behavior During Therapy: WFL for tasks assessed/performed Overall Cognitive Status: Within Functional Limits for tasks assessed                                 General Comments: Pateint appears slightly hard of hearing. Very concerned about the clinician's wellbeing over her own.      General Comments      Exercises Other Exercises Other Exercises: educated on how to size walker and SPC, educated on safe and effective use of SPC and RW   Assessment/Plan    PT Assessment Patient needs continued PT  services  PT Problem List Decreased mobility;Decreased range of motion;Decreased knowledge of precautions;Obesity;Decreased activity tolerance;Decreased skin integrity;Decreased balance;Decreased knowledge of use of DME       PT Treatment Interventions DME instruction;Therapeutic activities;Gait training;Therapeutic exercise;Patient/family education;Stair training;Balance training;Functional mobility training;Neuromuscular re-education    PT Goals (Current goals can be found in the Care Plan section)  Acute Rehab PT Goals Patient Stated Goal: return home PT Goal Formulation: With patient Time For Goal Achievement: 03/10/19 Potential to Achieve Goals: Good    Frequency 7X/week   Barriers to discharge        Co-evaluation               AM-PAC PT "6 Clicks" Mobility  Outcome Measure Help needed turning from your back to your side while in a flat bed without using bedrails?: None Help needed moving from lying on your back to sitting on the side of a flat bed without using bedrails?: None Help needed moving to and from a bed to a chair (including a wheelchair)?: A Little Help needed standing up from a chair using your arms (e.g., wheelchair or bedside chair)?: A Little Help needed to  walk in hospital room?: A Little Help needed climbing 3-5 steps with a railing? : A Little 6 Click Score: 20    End of Session Equipment Utilized During Treatment: Gait belt;Other (comment)(L post-op shoe)   Patient left: in chair;with chair alarm set;with call bell/phone within reach Nurse Communication: Mobility status PT Visit Diagnosis: Unsteadiness on feet (R26.81);Difficulty in walking, not elsewhere classified (R26.2)    Time: 1504-1364 PT Time Calculation (min) (ACUTE ONLY): 25 min   Charges:   PT Evaluation $PT Eval Low Complexity: 1 Low PT Treatments $Gait Training: 8-22 mins        Everlean Alstrom. Graylon Good, PT, DPT 02/24/19, 3:19 PM   Addendum to correct error in recommended  frequency.  Everlean Alstrom. Graylon Good, PT, DPT 02/24/19, 3:22 PM

## 2019-02-24 NOTE — Progress Notes (Signed)
[place] at [place] NAME: Joann [name]    MR#:  [id number]  DATE OF BIRTH:  Sep 25, 1945  SUBJECTIVE:  CHIEF COMPLAINT:  No chief complaint on file.  No new complaint this morning.  No fevers.  Patient status post left second toe amputation yesterday. REVIEW OF SYSTEMS:  Review of Systems  Constitutional: Negative for chills and fever.  HENT: Negative for hearing loss and tinnitus.   Eyes: Negative for blurred vision and double vision.  Respiratory: Negative for cough and shortness of breath.   Cardiovascular: Negative for chest pain and palpitations.  Gastrointestinal: Negative for abdominal pain, heartburn, nausea and vomiting.  Genitourinary: Negative for dysuria and urgency.  Musculoskeletal: Negative for myalgias and neck pain.  Skin: Negative for itching and rash.  Neurological: Negative for dizziness and headaches.  Psychiatric/Behavioral: Negative for depression and hallucinations.    DRUG ALLERGIES:   Allergies  Allergen Reactions  . Neosporin [Neomycin-Bacitracin Zn-Polymyx]     "it just don't agree with me"   . Penicillins Itching    Has patient had a PCN reaction causing immediate rash, facial/tongue/throat swelling, SOB or lightheadedness with hypotension: no Has patient had a PCN reaction causing severe rash involving mucus membranes or skin necrosis: no Has patient had a PCN reaction that required hospitalization no Has patient had a PCN reaction occurring within the last 10 years:no If all of the above answers are "NO", then may proceed with Cephalosporin use.   . Statins     hallucinations   VITALS:  Blood pressure 117/76, pulse 69, temperature 97.9 F (36.6 C), temperature source Oral, resp. rate 18, height 5\' 2"  (1.575 m), weight 86.5 kg, SpO2 100 %. PHYSICAL EXAMINATION:   Physical Exam  Constitutional: Joann [name] is oriented to person, place, and time. Joann [name] appears well-developed.  HENT:  Head: Normocephalic  and atraumatic.  Right Ear: External ear normal.  Eyes: Pupils are equal, round, and reactive to light. Conjunctivae are normal. Right eye exhibits no discharge.  Neck: Normal range of motion. No thyromegaly present.  Cardiovascular: Normal rate, regular rhythm and normal heart sounds.  Respiratory: Effort normal. No respiratory distress.  GI: Soft. Bowel sounds are normal.  Musculoskeletal: Normal range of motion.     Comments: Dressing in place to left foot  Neurological: Joann [name] is oriented to person, place, and time.  Skin: Joann [name] is not diaphoretic.   LABORATORY PANEL:  Female CBC Recent Labs  Lab 02/24/19 0616  WBC 10.7*  HGB 11.9*  HCT 37.0  PLT 385   ------------------------------------------------------------------------------------------------------------------ Chemistries  Recent Labs  Lab 02/21/19 1301 02/22/19 0621  02/24/19 0616  NA 139 135  --   --   K 4.1 3.6  --   --   CL 105 103  --   --   CO2 25 25  --   --   GLUCOSE 99 136*  --   --   BUN 15 16  --   --   CREATININE 0.97 0.94   < > 1.00  CALCIUM 9.4 8.6*  --   --   AST 21  --   --   --   ALT 16  --   --   --   ALKPHOS 96  --   --   --   BILITOT 0.7  --   --   --    < > = values in this interval not displayed.   RADIOLOGY:  No results found. ASSESSMENT AND  PLAN:   Left foot infection with osteomyelitis and septic arthritis. MRI:  1.  Osteomyelitis of left second toe and surrounding cellulitis Patient status post amputation by podiatrist yesterday.   Wound cultures growing staph aureus.  Discussed with pharmacist.  Vancomycin and cefepime discontinued and patient started on Ancef based on sensitivities.  Anticipate discharge in 1 to 2 days on oral antibiotics if patient remains stable. Physical therapy to see patient with weightbearing to heel with assistance.  Patient to wear postop shoe at all times.  2. Muscle edema throughout the plantar musculature. This may reflect an element of neurogenic edema  versus infectious myositis given that is contiguous with the abnormality in the second toe. Patient status post surgery.  Improving with IV antibiotics.  3.Hypertension. Continue home hypertension medication.  4. Diabetes.  Continue sliding scale. Hold metformin.   DVT prophylaxis; Lovenox   All the records are reviewed and case discussed with Care Management/Social Worker. Management plans discussed with the patient, family and they are in agreement.  CODE STATUS: DNR  TOTAL TIME TAKING CARE OF THIS PATIENT: 34 minutes.   More than 50% of the time was spent in counseling/coordination of care: YES  POSSIBLE D/C IN 2 DAYS, DEPENDING ON CLINICAL CONDITION.   Sydny Schnitzler M.D on 02/24/2019 at 3:38 PM  Between 7am to 6pm - Pager - [phone]  After 6pm go to [email or web address] - Proofreader  [facility] [place]  Office  [phone]  CC: Primary care physician; Baxter Hire, MD  Note: This dictation was prepared with Dragon dictation along with smaller phrase technology. Any transcriptional errors that result from this process are unintentional.

## 2019-02-24 NOTE — Consult Note (Signed)
Pharmacy Antibiotic Note  Joann [name] is a 73 y.o. female admitted on 02/21/2019 with possible osteomyelitis.  Pharmacy has been consulted for vancomycin and cefepime dosing. She has a penicillin allergy which she confirms to me but has tolerated cephalosporins in the recent past.  Plan: Day 4 of abx  1) vancomycin 750 mg IV Q 24 hrs following a 1500 mg loading dose Goal AUC 400-550 Expected AUC: 477 SCr used: 1 mg/dL BMI 33.1 T1/2: 17.9 h Css min: 12.9 mcg/mL Plan to order levels for tomorrow's dose.   2) Cefepime 2 g IV q12h.  Renal function stable, continue to monitor renal function. Will order SCr for AM.    Temp (24hrs), Avg:97.8 F (36.6 C), Min:97.3 F (36.3 C), Max:98.4 F (36.9 C)  Recent Labs  Lab 02/21/19 1301 02/22/19 0621 02/23/19 0458 02/24/19 0616  WBC  --  10.4  --  10.7*  CREATININE 0.97 0.94 0.95 1.00    Estimated Creatinine Clearance: 49.6 mL/min (by C-G formula based on SCr of 1 mg/dL).    Allergies  Allergen Reactions  . Neosporin [Neomycin-Bacitracin Zn-Polymyx]     "it just don't agree with me"   . Penicillins Itching    Has patient had a PCN reaction causing immediate rash, facial/tongue/throat swelling, SOB or lightheadedness with hypotension: no Has patient had a PCN reaction causing severe rash involving mucus membranes or skin necrosis: no Has patient had a PCN reaction that required hospitalization no Has patient had a PCN reaction occurring within the last 10 years:no If all of the above answers are "NO", then may proceed with Cephalosporin use.   . Statins     hallucinations    Antimicrobials this admission: vancomycin 7/29 >> cefepime 7/29 >>  metronidazole 7/29 >>  Microbiology results: 7/29 SARS CoV-2: negative   Thank you for allowing pharmacy to be a part of this patient's care.  Oswald Hillock, PharmD, BCPS 02/24/2019 9:00 AM

## 2019-02-24 NOTE — Progress Notes (Signed)
Daily Progress Note   Subjective  - 1 Day Post-Op  Follow-up left second toe amputation.  No complaints.  Objective Vitals:   02/23/19 1427 02/23/19 1523 02/23/19 2020 02/24/19 0509  BP: 124/72 123/72 126/70 107/63  Pulse: 86 80 (!) 58 61  Resp: 15 16 18 16   Temp: 97.9 F (36.6 C) (!) 97.5 F (36.4 C) 97.8 F (36.6 C) (!) 97.3 F (36.3 C)  TempSrc: Oral Oral Oral Oral  SpO2: 98% 96% 96% 96%  Weight:      Height:        Physical Exam: Incision is well coapted.  Erythema has diminished nicely to the dorsal foot.  No purulent drainage.  Wound culture with staph aureus.  Sensitivity still pending.  Laboratory CBC    Component Value Date/Time   WBC 10.7 (H) 02/24/2019 0616   HGB 11.9 (L) 02/24/2019 0616   HGB 14.9 10/02/2013 1336   HCT 37.0 02/24/2019 0616   HCT 44.0 10/02/2013 1336   PLT 385 02/24/2019 0616   PLT 316 10/02/2013 1336    BMET    Component Value Date/Time   NA 135 02/22/2019 0621   NA 135 (L) 10/02/2013 1336   K 3.6 02/22/2019 0621   K 3.5 10/02/2013 1336   CL 103 02/22/2019 0621   CL 101 10/02/2013 1336   CO2 25 02/22/2019 0621   CO2 29 10/02/2013 1336   GLUCOSE 136 (H) 02/22/2019 0621   GLUCOSE 183 (H) 10/02/2013 1336   BUN 16 02/22/2019 0621   BUN 14 10/02/2013 1336   CREATININE 1.00 02/24/2019 0616   CREATININE 0.90 10/02/2013 1336   CALCIUM 8.6 (L) 02/22/2019 0621   CALCIUM 9.2 10/02/2013 1336   GFRNONAA 55 (L) 02/24/2019 0616   GFRNONAA >60 10/02/2013 1336   GFRAA >60 02/24/2019 0616   GFRAA >60 10/02/2013 1336    Assessment/Planning: Osteomyelitis left second toe Diabetes with neuropathy   Patient had fairly good perfusion intraoperatively.  Do not suspect vascular disease is the culprit.  The head of the metatarsal was intact.  The cartilaginous cap was intact as well.  I suspect the excision of the toe was curative of osteomyelitis.  We will continue to treat cellulitis at this time.  Being final cultures.  Growing staph  aureus at this point.  Suspect patient can be discharged in next 1 to 2 days.  Dressing changed today.  Dressing changes only as needed if bandage becomes soiled.  Physical therapy to see today to assist with weightbearing to heel with assistance.  Should wear postop shoe at all times. [name]  02/24/2019, 9:03 AM

## 2019-02-25 LAB — BASIC METABOLIC PANEL
Anion gap: 8 (ref 5–15)
BUN: 17 mg/dL (ref 8–23)
CO2: 25 mmol/L (ref 22–32)
Calcium: 8.8 mg/dL — ABNORMAL LOW (ref 8.9–10.3)
Chloride: 104 mmol/L (ref 98–111)
Creatinine, Ser: 1 mg/dL (ref 0.44–1.00)
GFR calc Af Amer: >60 mL/min (ref >60)
GFR calc non Af Amer: 55 mL/min — ABNORMAL LOW (ref >60)
Glucose, Bld: 141 mg/dL — ABNORMAL HIGH (ref 70–99)
Potassium: 3.1 mmol/L — ABNORMAL LOW (ref 3.5–5.1)
Sodium: 137 mmol/L (ref 135–145)

## 2019-02-25 LAB — GLUCOSE, CAPILLARY
Glucose-Capillary: 110 mg/dL — ABNORMAL HIGH (ref 70–99)
Glucose-Capillary: 152 mg/dL — ABNORMAL HIGH (ref 70–99)

## 2019-02-25 LAB — MAGNESIUM: Magnesium: 2 mg/dL (ref 1.7–2.4)

## 2019-02-25 MED ORDER — POTASSIUM CHLORIDE CRYS ER 20 MEQ PO TBCR
40.0000 meq | EXTENDED_RELEASE_TABLET | Freq: Once | ORAL | Status: AC
  Administered 2019-02-25: 09:00:00 40 meq via ORAL
  Filled 2019-02-25: qty 2

## 2019-02-25 MED ORDER — HYDROCODONE-ACETAMINOPHEN 5-325 MG PO TABS: 1.0000 | ORAL_TABLET | Freq: Four times a day (QID) | ORAL | 0 refills | Status: DC | PRN

## 2019-02-25 MED ORDER — POLYETHYLENE GLYCOL 3350 17 G PO PACK: 17.0000 g | PACK | Freq: Every day | ORAL | 0 refills | Status: DC | PRN

## 2019-02-25 MED ORDER — CEPHALEXIN 250 MG PO CAPS: 250.0000 mg | ORAL_CAPSULE | Freq: Four times a day (QID) | ORAL | 0 refills | Status: DC

## 2019-02-25 NOTE — TOC Transition Note (Signed)
Transition of Care [facility]) - CM/SW Discharge Note   Patient Details  Name: Joann [name] MRN: [id number] Date of Birth: 08-Aug-1945  Transition of Care [facility]) CM/SW Contact:  Latanya Maudlin, RN Phone Number: 02/25/2019, 1:36 PM   Clinical Narrative:   Patient to be discharged per MD order. Orders in place for home health services. Previous TOC team member was able to set up home health with [facility]. Notified Corene Cornea of discharge. Patient in need of rolling walker, obtained from [place] with [facility] and brought to bedside. Family to transport.     Final next level of care: Home w Home Health Services Barriers to Discharge: No Barriers Identified   Patient Goals and CMS Choice   CMS Medicare.gov Compare Post Acute Care list provided to:: Patient Choice offered to / list presented to : Patient  Discharge Placement                       Discharge Plan and Services   Discharge Planning Services: CM Consult Post Acute Care Choice: Home Health          DME Arranged: Walker rolling DME Agency: [facility] Date DME Agency Contacted: 02/25/19 Time DME Agency Contacted: [phone] Representative spoke with at DME Agency: [place]: PT [place]: [place] ([place]) Date [place]: 02/25/19 Time [place]: 1326 Representative spoke with at [place]: [place] ([place]) Interventions     Readmission Risk Interventions No flowsheet data found.

## 2019-02-25 NOTE — Discharge Summary (Signed)
[place] at [place] NAME: Joann [name]    MR#:  [id number]  DATE OF BIRTH:  July 12, 1946  DATE OF ADMISSION:  02/21/2019   ADMITTING PHYSICIAN: Henreitta Leber, MD  DATE OF DISCHARGE: 02/25/2019  PRIMARY CARE PHYSICIAN: Baxter Hire, MD   ADMISSION DIAGNOSIS:  Osteomyelitis Diabetes DISCHARGE DIAGNOSIS:  Active Problems:   Osteomyelitis of left foot ([place])  SECONDARY DIAGNOSIS:   Past Medical History:  Diagnosis Date  . Breast cancer ([place]) 2007   right breast lumpectomy with 36 rad tx  . Breast mass 11/23/2016   6 o'clock  . Diabetes mellitus without complication ([place])   . Hypertension    HOSPITAL COURSE:  Chief complaint; left foot infection  History of presenting complaint; Joann [name]  is a 73 y.o. female with a known history of breast cancer, hypertension, diabetes.  Patient is sent to the medical floor by Dr. Elvina Mattes for direct admission due to left foot infection, possible osteomyelitis.  The patient has no complaints.  She denies any symptoms.  She got left foot injury since last months.  Left foot wound wound still has not healed.   Hospital course; Left foot infection with osteomyelitis and septic arthritis. MRI:  1.  Osteomyelitis of left second toe and surrounding cellulitis Patient status post amputation by podiatrist yesterday.   Wound cultures growing staph aureus.  Discussed with pharmacist.  Vancomycin and cefepime discontinued and patient started on Ancef based on sensitivities.  Patient tolerating Ancef well without any complications despite documentation of allergy to penicillins.  Plan is for discharge on p.o. Keflex to complete treatment duration.  Patient seen by physical therapist.  Recommended home health with physical therapy on discharge.  Cleared for discharge by podiatrist on p.o. robotics. WB in post op shoe only. MInimal WB as necessary.  2. Muscle edema throughout the plantar musculature.  Findings most likely due to cellulitis.  Patient status post left second toe amputation.  Clinically improved with IV antibiotics.  Being discharged on p.o. robotics as mentioned above.   3.Hypertension. Continue home hypertension medication.  4. Diabetes. Resume home regimen on discharge  DISCHARGE CONDITIONS:  Stable CONSULTS OBTAINED:   DRUG ALLERGIES:   Allergies  Allergen Reactions  . Neosporin [Neomycin-Bacitracin Zn-Polymyx]     "it just don't agree with me"   . Penicillins Itching    Has patient had a PCN reaction causing immediate rash, facial/tongue/throat swelling, SOB or lightheadedness with hypotension: no Has patient had a PCN reaction causing severe rash involving mucus membranes or skin necrosis: no Has patient had a PCN reaction that required hospitalization no Has patient had a PCN reaction occurring within the last 10 years:no If all of the above answers are "NO", then may proceed with Cephalosporin use.   . Statins     hallucinations   DISCHARGE MEDICATIONS:   Allergies as of 02/25/2019      Reactions   Neosporin [neomycin-bacitracin Zn-polymyx]    "it just don't agree with me"    Penicillins Itching   Has patient had a PCN reaction causing immediate rash, facial/tongue/throat swelling, SOB or lightheadedness with hypotension: no Has patient had a PCN reaction causing severe rash involving mucus membranes or skin necrosis: no Has patient had a PCN reaction that required hospitalization no Has patient had a PCN reaction occurring within the last 10 years:no If all of the above answers are "NO", then may proceed with Cephalosporin use.   Statins  hallucinations      Medication List    TAKE these medications   aspirin EC 81 MG tablet Take 81 mg by mouth daily.   cephALEXin 250 MG capsule Commonly known as: KEFLEX Take 1 capsule (250 mg total) by mouth 4 (four) times daily for 5 days.   hydrochlorothiazide 12.5 MG tablet Commonly known as:  HYDRODIURIL Take 12.5 mg by mouth every evening.   HYDROcodone-acetaminophen 5-325 MG tablet Commonly known as: NORCO/VICODIN Take 1-2 tablets by mouth every 6 (six) hours as needed for severe pain.   lisinopril 10 MG tablet Commonly known as: ZESTRIL Take 10 mg by mouth daily.   metFORMIN 500 MG tablet Commonly known as: GLUCOPHAGE Take 500 mg by mouth 2 (two) times daily.   polyethylene glycol 17 g packet Commonly known as: MiraLax Take 17 g by mouth daily as needed.        DISCHARGE INSTRUCTIONS:   DIET:  Diabetic diet DISCHARGE CONDITION:  Stable ACTIVITY:  Activity as tolerated OXYGEN:  Home Oxygen: No.  Oxygen Delivery: room air DISCHARGE LOCATION:  home   If you experience worsening of your admission symptoms, develop shortness of breath, life threatening emergency, suicidal or homicidal thoughts you must seek medical attention immediately by calling 911 or calling your MD immediately  if symptoms less severe.  You Must read complete instructions/literature along with all the possible adverse reactions/side effects for all the Medicines you take and that have been prescribed to you. Take any new Medicines after you have completely understood and accpet all the possible adverse reactions/side effects.   Please note  You were cared for by a hospitalist during your hospital stay. If you have any questions about your discharge medications or the care you received while you were in the hospital after you are discharged, you can call the unit and asked to speak with the hospitalist on call if the hospitalist that took care of you is not available. Once you are discharged, your primary care physician will handle any further medical issues. Please note that NO REFILLS for any discharge medications will be authorized once you are discharged, as it is imperative that you return to your primary care physician (or establish a relationship with a primary care physician if you do  not have one) for your aftercare needs so that they can reassess your need for medications and monitor your lab values.    On the day of Discharge:  VITAL SIGNS:  Blood pressure 120/72, pulse 66, temperature 97.6 F (36.4 C), temperature source Oral, resp. rate 17, height 5\' 2"  (1.575 m), weight 86.5 kg, SpO2 97 %. PHYSICAL EXAMINATION:  GENERAL:  73 y.o.-year-old patient lying in the bed with no acute distress.  EYES: Pupils equal, round, reactive to light and accommodation. No scleral icterus. Extraocular muscles intact.  HEENT: Head atraumatic, normocephalic. Oropharynx and nasopharynx clear.  NECK:  Supple, no jugular venous distention. No thyroid enlargement, no tenderness.  LUNGS: Normal breath sounds bilaterally, no wheezing, rales,rhonchi or crepitation. No use of accessory muscles of respiration.  CARDIOVASCULAR: S1, S2 normal. No murmurs, rubs, or gallops.  ABDOMEN: Soft, non-tender, non-distended. Bowel sounds present. No organomegaly or mass.  EXTREMITIES: No pedal edema, cyanosis, or clubbing.  NEUROLOGIC: Cranial nerves II through XII are intact. Muscle strength 5/5 in all extremities. Sensation intact. Gait not checked.  PSYCHIATRIC: The patient is alert and oriented x 3.  SKIN: No obvious rash, lesion, or ulcer.  DATA REVIEW:   CBC Recent Labs  Lab  02/24/19 0616  WBC 10.7*  HGB 11.9*  HCT 37.0  PLT 385    Chemistries  Recent Labs  Lab 02/21/19 1301  02/25/19 0705  NA 139   < > 137  K 4.1   < > 3.1*  CL 105   < > 104  CO2 25   < > 25  GLUCOSE 99   < > 141*  BUN 15   < > 17  CREATININE 0.97   < > 1.00  CALCIUM 9.4   < > 8.8*  MG  --   --  2.0  AST 21  --   --   ALT 16  --   --   ALKPHOS 96  --   --   BILITOT 0.7  --   --    < > = values in this interval not displayed.     Microbiology Results  Results for orders placed or performed during the hospital encounter of 02/21/19  SARS Coronavirus 2 (CEPHEID- Performed in [place] hospital lab), Hosp  Order     Status: None   Collection Time: 02/21/19 11:50 AM   Specimen: Nasopharyngeal Swab  Result Value Ref Range Status   SARS Coronavirus 2 NEGATIVE NEGATIVE Final    Comment: (NOTE) If result is NEGATIVE SARS-CoV-2 target nucleic acids are NOT DETECTED. The SARS-CoV-2 RNA is generally detectable in upper and lower  respiratory specimens during the acute phase of infection. The lowest  concentration of SARS-CoV-2 viral copies this assay can detect is 250  copies / mL. A negative result does not preclude SARS-CoV-2 infection  and should not be used as the sole basis for treatment or other  patient management decisions.  A negative result may occur with  improper specimen collection / handling, submission of specimen other  than nasopharyngeal swab, presence of viral mutation(s) within the  areas targeted by this assay, and inadequate number of viral copies  (<250 copies / mL). A negative result must be combined with clinical  observations, patient history, and epidemiological information. If result is POSITIVE SARS-CoV-2 target nucleic acids are DETECTED. The SARS-CoV-2 RNA is generally detectable in upper and lower  respiratory specimens dur ing the acute phase of infection.  Positive  results are indicative of active infection with SARS-CoV-2.  Clinical  correlation with patient history and other diagnostic information is  necessary to determine patient infection status.  Positive results do  not rule out bacterial infection or co-infection with other viruses. If result is PRESUMPTIVE POSTIVE SARS-CoV-2 nucleic acids MAY BE PRESENT.   A presumptive positive result was obtained on the submitted specimen  and confirmed on repeat testing.  While 2019 novel coronavirus  (SARS-CoV-2) nucleic acids may be present in the submitted sample  additional confirmatory testing may be necessary for epidemiological  and / or clinical management purposes  to differentiate between  SARS-CoV-2  and other Sarbecovirus currently known to infect humans.  If clinically indicated additional testing with an alternate test  methodology [phone]) is advised. The SARS-CoV-2 RNA is generally  detectable in upper and lower respiratory sp ecimens during the acute  phase of infection. The expected result is Negative. Fact Sheet for Patients:  StrictlyIdeas.no Fact Sheet for Healthcare Providers: BankingDealers.co.za This test is not yet approved or cleared by the [place] FDA and has been authorized for detection and/or diagnosis of SARS-CoV-2 by FDA under an Emergency Use Authorization (EUA).  This EUA will remain in effect (meaning this test can be used) for  the duration of the COVID-19 declaration under Section 564(b)(1) of the Act, 21 U.S.C. section 360bbb-3(b)(1), unless the authorization is terminated or revoked sooner. Performed at [facility], [address] [place], [place] [address]   Aerobic/Anaerobic Culture (surgical/deep wound)     Status: None (Preliminary result)   Collection Time: 02/23/19  1:30 PM   Specimen: [facility] Other; Wound  Result Value Ref Range Status   Specimen Description   Final    BONE Performed at [facility], [address] [place], [place] [address]    Special Requests   Final    LEFT SECOND TOE BONE CULTURE Performed at [facility], [address] [place], [place] [address]    Gram Stain   Final    ABUNDANT WBC PRESENT, PREDOMINANTLY PMN MODERATE GRAM NEGATIVE RODS MODERATE GRAM POSITIVE COCCI Performed at [facility], [place] [address] [place], [place] [address]    Culture   Final    RARE STAPHYLOCOCCUS AUREUS CULTURE REINCUBATED FOR BETTER GROWTH NO ANAEROBES ISOLATED; CULTURE IN PROGRESS FOR 5 DAYS    Report Status PENDING  Incomplete    RADIOLOGY:  No results found.   Management plans discussed with the patient, family and they are in  agreement.  CODE STATUS: DNR   TOTAL TIME TAKING CARE OF THIS PATIENT: 37 minutes.    Zyon Grout M.D on 02/25/2019 at 12:33 PM  Between 7am to 6pm - Pager - [phone]  After 6pm go to [email or web address] - Proofreader  [facility] [place] [facility]  Office  [phone]  CC: Primary care physician; Baxter Hire, MD   Note: This dictation was prepared with Dragon dictation along with smaller phrase technology. Any transcriptional errors that result from this process are unintentional.

## 2019-02-25 NOTE — Progress Notes (Signed)
Physical Therapy Treatment Patient Details Name: Joann [name] MRN: [id number] DOB: [birth year] Today's Date: 02/25/2019    History of Present Illness Joann [name] is a 73 y.o. female who was directly admitted to the hospital on 02/21/2019 due to left foot infection. She was found to have osteomyelitis of lesser toe on the L foot and underwent amputation of the left second to MTPJ on 02/22/2019. Relevant PMH includes breast cancer, HTN, DM. She may weight bear to L heel with steps with L post-op shoe applied at all times.    PT Comments    Pt sitting EOB anticipating discharge today.  Stated she is walking in room and to bathroom without assist and post op shoe.  Agrees to gait.  Offered RW. She stated she does not plan to use one at home.  Stated her mom has 3 at home and she can borrow one if needed.  Stated she has a "walking stick" at home and will hold her husband if needed.  Post op shoe on.  Trial of gait in hallway with HHA +1.  Pt overall does well with HHA despite some sway due to post op shoe.  Educated on overall safety and balance concerns with shoe but pt stated she felt she would be fine.  No overt LOB's noted.  Returned to room and remained seated on bed awaiting MD.   Follow Up Recommendations  Home health PT     Equipment Recommendations       Recommendations for Other Services       Precautions / Restrictions Precautions Precaution Comments: WB to heel with steps L post op shoe at all times Required Braces or Orthoses: Other Brace Other Brace: L post-op shoe Restrictions Weight Bearing Restrictions: Yes Other Position/Activity Restrictions: WB to heel with steps L post-op shoe at all times    Mobility  Bed Mobility Overal bed mobility: Independent                Transfers Overall transfer level: Modified independent     Sit to Stand: Supervision            Ambulation/Gait Ambulation/Gait assistance: Supervision;Min guard Gait Distance (Feet):  200 Feet Assistive device: 1 person hand held assist Gait Pattern/deviations: Step-to pattern Gait velocity: slow but functional   General Gait Details: Declined RW today stating she would not use one at home - has "walking stick" and plans to hold her husbands hand.  Has access to a walker if needed.   Stairs             Wheelchair Mobility    Modified Rankin (Stroke Patients Only)       Balance Overall balance assessment: Needs assistance Sitting-balance support: Feet supported Sitting balance-Leahy Scale: Normal     Standing balance support: During functional activity;Single extremity supported Standing balance-Leahy Scale: Good                              Cognition Arousal/Alertness: Awake/alert Behavior During Therapy: WFL for tasks assessed/performed Overall Cognitive Status: Within Functional Limits for tasks assessed                                        Exercises      General Comments        Pertinent Vitals/Pain Pain Assessment: No/denies pain    Home  Living                      Prior Function            PT Goals (current goals can now be found in the care plan section) Progress towards PT goals: Progressing toward goals    Frequency    7X/week      PT Plan Current plan remains appropriate    Co-evaluation              AM-PAC PT "6 Clicks" Mobility   Outcome Measure  Help needed turning from your back to your side while in a flat bed without using bedrails?: None Help needed moving from lying on your back to sitting on the side of a flat bed without using bedrails?: None Help needed moving to and from a bed to a chair (including a wheelchair)?: None Help needed standing up from a chair using your arms (e.g., wheelchair or bedside chair)?: None Help needed to walk in hospital room?: A Little Help needed climbing 3-5 steps with a railing? : A Little 6 Click Score: 22    End of  Session Equipment Utilized During Treatment: Gait belt;Other (comment) Activity Tolerance: Patient tolerated treatment well Patient left: in bed;with call bell/phone within reach         Time: 1132-1143 PT Time Calculation (min) (ACUTE ONLY): 11 min  Charges:  $Gait Training: 8-22 mins                    Chesley Noon, PTA 02/25/19, 1:36 PM

## 2019-02-25 NOTE — Progress Notes (Signed)
Daily Progress Note   Subjective  - 2 Days Post-Op  F/u 2nd toe amputation  Objective Vitals:   02/24/19 0904 02/24/19 1335 02/24/19 2049 02/25/19 0405  BP: 112/60 117/76 140/60 120/72  Pulse:  69 60 66  Resp:  18 20 17   Temp:  97.9 F (36.6 C) 98.3 F (36.8 C) 97.6 F (36.4 C)  TempSrc:  Oral Oral Oral  SpO2:  100% 98% 97%  Weight:      Height:        Physical Exam: Dressing intact. Will have nursing change.  Laboratory CBC    Component Value Date/Time   WBC 10.7 (H) 02/24/2019 0616   HGB 11.9 (L) 02/24/2019 0616   HGB 14.9 10/02/2013 1336   HCT 37.0 02/24/2019 0616   HCT 44.0 10/02/2013 1336   PLT 385 02/24/2019 0616   PLT 316 10/02/2013 1336    BMET    Component Value Date/Time   NA 137 02/25/2019 0705   NA 135 (L) 10/02/2013 1336   K 3.1 (L) 02/25/2019 0705   K 3.5 10/02/2013 1336   CL 104 02/25/2019 0705   CL 101 10/02/2013 1336   CO2 25 02/25/2019 0705   CO2 29 10/02/2013 1336   GLUCOSE 141 (H) 02/25/2019 0705   GLUCOSE 183 (H) 10/02/2013 1336   BUN 17 02/25/2019 0705   BUN 14 10/02/2013 1336   CREATININE 1.00 02/25/2019 0705   CREATININE 0.90 10/02/2013 1336   CALCIUM 8.8 (L) 02/25/2019 0705   CALCIUM 9.2 10/02/2013 1336   GFRNONAA 55 (L) 02/25/2019 0705   GFRNONAA >60 10/02/2013 1336   GFRAA >60 02/25/2019 0705   GFRAA >60 10/02/2013 1336   Cultures with pan sensitive staph and strep.  Assessment/Planning: Osteomyelitis s/p 2nd toe amputation   Dressing will be changed by nursing today.  OK from podiatry standpoint for d/c today.  No dressing changes needed.  WB in post op shoe only.  PT has seen.  MInimal WB as necessary.  PO antibiotic (augementin.)  F/u with me this week for dressing change.   [name]  02/25/2019, 11:01 AM

## 2019-02-27 LAB — SURGICAL PATHOLOGY

## 2019-02-28 LAB — AEROBIC/ANAEROBIC CULTURE W GRAM STAIN (SURGICAL/DEEP WOUND)

## 2019-03-01 ENCOUNTER — Other Ambulatory Visit: Payer: Self-pay

## 2019-03-01 ENCOUNTER — Inpatient Hospital Stay
Admission: AD | Admit: 2019-03-01 | Discharge: 2019-03-05 | DRG: 478 | Disposition: A | Discharge status: Home Health | Payer: Medicare Other | Source: Ambulatory Visit | Attending: Internal Medicine | Admitting: Internal Medicine

## 2019-03-01 DIAGNOSIS — E1140 Type 2 diabetes mellitus with diabetic neuropathy, unspecified: Secondary | ICD-10-CM | POA: Diagnosis present

## 2019-03-01 DIAGNOSIS — Z880 Allergy status to penicillin: Secondary | ICD-10-CM

## 2019-03-01 DIAGNOSIS — Z923 Personal history of irradiation: Secondary | ICD-10-CM | POA: Diagnosis not present

## 2019-03-01 DIAGNOSIS — I10 Essential (primary) hypertension: Secondary | ICD-10-CM | POA: Diagnosis present

## 2019-03-01 DIAGNOSIS — T8781 Dehiscence of amputation stump: Secondary | ICD-10-CM | POA: Diagnosis not present

## 2019-03-01 DIAGNOSIS — B9561 Methicillin susceptible Staphylococcus aureus infection as the cause of diseases classified elsewhere: Secondary | ICD-10-CM | POA: Diagnosis not present

## 2019-03-01 DIAGNOSIS — L03116 Cellulitis of left lower limb: Secondary | ICD-10-CM | POA: Diagnosis present

## 2019-03-01 DIAGNOSIS — E876 Hypokalemia: Secondary | ICD-10-CM | POA: Diagnosis not present

## 2019-03-01 DIAGNOSIS — Z7982 Long term (current) use of aspirin: Secondary | ICD-10-CM

## 2019-03-01 DIAGNOSIS — Z8249 Family history of ischemic heart disease and other diseases of the circulatory system: Secondary | ICD-10-CM

## 2019-03-01 DIAGNOSIS — E1169 Type 2 diabetes mellitus with other specified complication: Secondary | ICD-10-CM | POA: Diagnosis present

## 2019-03-01 DIAGNOSIS — Z853 Personal history of malignant neoplasm of breast: Secondary | ICD-10-CM | POA: Diagnosis not present

## 2019-03-01 DIAGNOSIS — Z7984 Long term (current) use of oral hypoglycemic drugs: Secondary | ICD-10-CM

## 2019-03-01 DIAGNOSIS — Z66 Do not resuscitate: Secondary | ICD-10-CM | POA: Diagnosis present

## 2019-03-01 DIAGNOSIS — M86172 Other acute osteomyelitis, left ankle and foot: Secondary | ICD-10-CM | POA: Diagnosis present

## 2019-03-01 DIAGNOSIS — T8744 Infection of amputation stump, left lower extremity: Secondary | ICD-10-CM | POA: Diagnosis present

## 2019-03-01 DIAGNOSIS — M869 Osteomyelitis, unspecified: Secondary | ICD-10-CM | POA: Diagnosis not present

## 2019-03-01 DIAGNOSIS — L089 Local infection of the skin and subcutaneous tissue, unspecified: Secondary | ICD-10-CM | POA: Diagnosis not present

## 2019-03-01 DIAGNOSIS — Z20828 Contact with and (suspected) exposure to other viral communicable diseases: Secondary | ICD-10-CM | POA: Diagnosis present

## 2019-03-01 DIAGNOSIS — Z872 Personal history of diseases of the skin and subcutaneous tissue: Secondary | ICD-10-CM | POA: Diagnosis not present

## 2019-03-01 DIAGNOSIS — Z881 Allergy status to other antibiotic agents status: Secondary | ICD-10-CM | POA: Diagnosis not present

## 2019-03-01 DIAGNOSIS — Z888 Allergy status to other drugs, medicaments and biological substances status: Secondary | ICD-10-CM

## 2019-03-01 DIAGNOSIS — Z79899 Other long term (current) drug therapy: Secondary | ICD-10-CM

## 2019-03-01 DIAGNOSIS — B954 Other streptococcus as the cause of diseases classified elsewhere: Secondary | ICD-10-CM | POA: Diagnosis not present

## 2019-03-01 DIAGNOSIS — Z89422 Acquired absence of other left toe(s): Secondary | ICD-10-CM

## 2019-03-01 DIAGNOSIS — E11628 Type 2 diabetes mellitus with other skin complications: Secondary | ICD-10-CM | POA: Diagnosis not present

## 2019-03-01 DIAGNOSIS — Z8619 Personal history of other infectious and parasitic diseases: Secondary | ICD-10-CM | POA: Diagnosis not present

## 2019-03-01 DIAGNOSIS — L0390 Cellulitis, unspecified: Secondary | ICD-10-CM | POA: Diagnosis present

## 2019-03-01 LAB — GLUCOSE, CAPILLARY
Glucose-Capillary: 100 mg/dL — ABNORMAL HIGH (ref 70–99)
Glucose-Capillary: 93 mg/dL (ref 70–99)

## 2019-03-01 LAB — COMPREHENSIVE METABOLIC PANEL
ALT: 30 U/L (ref 0–44)
AST: 49 U/L — ABNORMAL HIGH (ref 15–41)
Albumin: 3.5 g/dL (ref 3.5–5.0)
Alkaline Phosphatase: 136 U/L — ABNORMAL HIGH (ref 38–126)
Anion gap: 11 (ref 5–15)
BUN: 22 mg/dL (ref 8–23)
CO2: 22 mmol/L (ref 22–32)
Calcium: 9.7 mg/dL (ref 8.9–10.3)
Chloride: 106 mmol/L (ref 98–111)
Creatinine, Ser: 1.05 mg/dL — ABNORMAL HIGH (ref 0.44–1.00)
GFR calc Af Amer: >60 mL/min (ref >60)
GFR calc non Af Amer: 52 mL/min — ABNORMAL LOW (ref >60)
Glucose, Bld: 110 mg/dL — ABNORMAL HIGH (ref 70–99)
Potassium: 3.5 mmol/L (ref 3.5–5.1)
Sodium: 139 mmol/L (ref 135–145)
Total Bilirubin: 0.4 mg/dL (ref 0.3–1.2)
Total Protein: 8.3 g/dL — ABNORMAL HIGH (ref 6.5–8.1)

## 2019-03-01 LAB — CBC
HCT: 39.9 % (ref 36.0–46.0)
Hemoglobin: 12.8 g/dL (ref 12.0–15.0)
MCH: 29.2 pg (ref 26.0–34.0)
MCHC: 32.1 g/dL (ref 30.0–36.0)
MCV: 91.1 fL (ref 80.0–100.0)
Platelets: 445 10*3/uL — ABNORMAL HIGH (ref 150–400)
RBC: 4.38 MIL/uL (ref 3.87–5.11)
RDW: 13.8 % (ref 11.5–15.5)
WBC: 13.3 10*3/uL — ABNORMAL HIGH (ref 4.0–10.5)
nRBC: 0 % (ref 0.0–0.2)

## 2019-03-01 LAB — SARS CORONAVIRUS 2 BY RT PCR (HOSPITAL ORDER, PERFORMED IN ~~LOC~~ HOSPITAL LAB): SARS Coronavirus 2: NEGATIVE

## 2019-03-01 LAB — MRSA PCR SCREENING: MRSA by PCR: NEGATIVE

## 2019-03-01 MED ORDER — HEPARIN SODIUM (PORCINE) 5000 UNIT/ML IJ SOLN
5000.0000 [IU] | Freq: Three times a day (TID) | INTRAMUSCULAR | Status: DC
  Administered 2019-03-01: 5000 [IU] via SUBCUTANEOUS
  Filled 2019-03-01: qty 1

## 2019-03-01 MED ORDER — VANCOMYCIN HCL IN DEXTROSE 750-5 MG/150ML-% IV SOLN
750.0000 mg | INTRAVENOUS | Status: DC
  Filled 2019-03-01: qty 150

## 2019-03-01 MED ORDER — DOCUSATE SODIUM 100 MG PO CAPS
100.0000 mg | ORAL_CAPSULE | Freq: Two times a day (BID) | ORAL | Status: DC | PRN
  Administered 2019-03-05: 12:00:00 100 mg via ORAL
  Filled 2019-03-01: qty 1

## 2019-03-01 MED ORDER — LISINOPRIL 10 MG PO TABS
10.0000 mg | ORAL_TABLET | Freq: Every day | ORAL | Status: DC
  Administered 2019-03-03 – 2019-03-05 (×3): 10 mg via ORAL
  Filled 2019-03-01 (×3): qty 1

## 2019-03-01 MED ORDER — HYDROCODONE-ACETAMINOPHEN 5-325 MG PO TABS: 1.0000 | ORAL_TABLET | Freq: Four times a day (QID) | ORAL | Status: DC | PRN

## 2019-03-01 MED ORDER — SODIUM CHLORIDE 0.9 % IV SOLN
2.0000 g | Freq: Two times a day (BID) | INTRAVENOUS | Status: DC
  Administered 2019-03-01 – 2019-03-02 (×2): 2 g via INTRAVENOUS
  Filled 2019-03-01 (×3): qty 2

## 2019-03-01 MED ORDER — SODIUM CHLORIDE 0.9 % IV SOLN
INTRAVENOUS | Status: DC | PRN
  Administered 2019-03-01: 1000 mL via INTRAVENOUS

## 2019-03-01 MED ORDER — INSULIN ASPART 100 UNIT/ML ~~LOC~~ SOLN: 0.0000 [IU] | Freq: Every day | SUBCUTANEOUS | Status: DC

## 2019-03-01 MED ORDER — POVIDONE-IODINE 10 % EX SWAB: 2.0000 "application " | Freq: Once | CUTANEOUS | Status: DC

## 2019-03-01 MED ORDER — VANCOMYCIN HCL 10 G IV SOLR
2000.0000 mg | Freq: Once | INTRAVENOUS | Status: AC
  Administered 2019-03-01: 2000 mg via INTRAVENOUS
  Filled 2019-03-01: qty 2000

## 2019-03-01 MED ORDER — ASPIRIN EC 81 MG PO TBEC
81.0000 mg | DELAYED_RELEASE_TABLET | Freq: Every day | ORAL | Status: DC
  Administered 2019-03-02 – 2019-03-05 (×4): 81 mg via ORAL
  Filled 2019-03-01 (×4): qty 1

## 2019-03-01 MED ORDER — ENSURE PRE-SURGERY PO LIQD
296.0000 mL | Freq: Once | ORAL | Status: AC
  Administered 2019-03-01: 296 mL via ORAL
  Filled 2019-03-01: qty 296

## 2019-03-01 MED ORDER — CHLORHEXIDINE GLUCONATE 4 % EX LIQD
60.0000 mL | Freq: Once | CUTANEOUS | Status: AC
  Administered 2019-03-02: 4 via TOPICAL

## 2019-03-01 MED ORDER — POLYETHYLENE GLYCOL 3350 17 G PO PACK
17.0000 g | PACK | Freq: Every day | ORAL | Status: DC | PRN
  Administered 2019-03-05: 17 g via ORAL
  Filled 2019-03-01: qty 1

## 2019-03-01 MED ORDER — INSULIN ASPART 100 UNIT/ML ~~LOC~~ SOLN
0.0000 [IU] | Freq: Three times a day (TID) | SUBCUTANEOUS | Status: DC
  Administered 2019-03-02: 1 [IU] via SUBCUTANEOUS
  Administered 2019-03-03 (×2): 2 [IU] via SUBCUTANEOUS
  Administered 2019-03-04 (×3): 1 [IU] via SUBCUTANEOUS
  Filled 2019-03-01 (×6): qty 1

## 2019-03-01 NOTE — Anesthesia Preprocedure Evaluation (Addendum)
Anesthesia Evaluation  Patient identified by MRN, date of birth, ID band Patient awake    Reviewed: Allergy & Precautions, NPO status , Patient's Chart, lab work & pertinent test results  History of Anesthesia Complications Negative for: history of anesthetic complications  Airway Mallampati: II       Dental   Pulmonary neg sleep apnea, neg COPD, Not current smoker,           Cardiovascular hypertension, Pt. on medications (-) Past MI and (-) CHF (-) dysrhythmias      Neuro/Psych neg Seizures    GI/Hepatic Neg liver ROS, neg GERD  ,  Endo/Other  diabetes, Type 2, Oral Hypoglycemic Agents  Renal/GU negative Renal ROS     Musculoskeletal   Abdominal   Peds  Hematology   Anesthesia Other Findings Past Medical History: 2007: Breast cancer ([place])     Comment:  right breast lumpectomy with 36 rad tx 11/23/2016: Breast mass     Comment:  6 o'clock No date: Diabetes mellitus without complication (HCC) No date: Hypertension   Reproductive/Obstetrics                           Anesthesia Physical Anesthesia Plan  ASA: III  Anesthesia Plan: General   Post-op Pain Management:    Induction: Intravenous  PONV Risk Score and Plan: 3 and Dexamethasone, Ondansetron and Midazolam  Airway Management Planned: LMA  Additional Equipment:   Intra-op Plan:   Post-operative Plan:   Informed Consent: I have reviewed the patients History and Physical, chart, labs and discussed the procedure including the risks, benefits and alternatives for the proposed anesthesia with the patient or authorized representative who has indicated his/her understanding and acceptance.       Plan Discussed with:   Anesthesia Plan Comments:         Anesthesia Quick Evaluation

## 2019-03-01 NOTE — H&P (Signed)
[place] [facility] [place] NAME: Joann [name]    MR#:  [id number]  DATE OF BIRTH:  Jul 12, 1946  DATE OF ADMISSION:  03/01/2019  PRIMARY CARE PHYSICIAN: Baxter Hire, MD   REQUESTING/REFERRING PHYSICIAN: Vickki Muff  CHIEF COMPLAINT:  No chief complaint on file.   HISTORY OF PRESENT ILLNESS: Joann [name]  is a 73 y.o. female with a known history of breast cancer, diabetes, hypertension, recently admitted for osteomyelitis and second toe amputation was done by podiatry and sent home after reviewing the culture reports with oral antibiotics 3 days ago. She went for follow-up at podiatry clinic with Dr. Vickki Muff- suspected still to have deep infection and so sent her for direct admission and possible more debridement and need for IV antibiotic therapy.  Patient denies any complaints.  PAST MEDICAL HISTORY:   Past Medical History:  Diagnosis Date  . Breast cancer ([place]) 2007   right breast lumpectomy with 36 rad tx  . Breast mass 11/23/2016   6 o'clock  . Diabetes mellitus without complication ([place])   . Hypertension     PAST SURGICAL HISTORY:  Past Surgical History:  Procedure Laterality Date  . AMPUTATION Left 02/23/2019   Procedure: AMPUTATION LEFT 2nd Toe;  Surgeon: Samara Deist, DPM;  Location: [facility] ORS;  Service: Podiatry;  Laterality: Left;  . BREAST BIOPSY Right 2007   positive.  Lumpectomy with rad tx  . BREAST LUMPECTOMY Right 2007  . INCISION AND DRAINAGE Left 10/16/2016   Procedure: INCISION AND DRAINAGE;  Surgeon: Albertine Patricia, DPM;  Location: [facility] ORS;  Service: Podiatry;  Laterality: Left;    SOCIAL HISTORY:  Social History   Tobacco Use  . Smoking status: Never Smoker  . Smokeless tobacco: Never Used  Substance Use Topics  . Alcohol use: No    FAMILY HISTORY:  Family History  Problem Relation Age of Onset  . Dementia Mother   . CAD Mother   . CAD Father   . Deep vein thrombosis Father   . Breast cancer Neg Hx      DRUG ALLERGIES:  Allergies  Allergen Reactions  . Neosporin [Neomycin-Bacitracin Zn-Polymyx]     "it just don't agree with me"   . Penicillins Itching    Has patient had a PCN reaction causing immediate rash, facial/tongue/throat swelling, SOB or lightheadedness with hypotension: no Has patient had a PCN reaction causing severe rash involving mucus membranes or skin necrosis: no Has patient had a PCN reaction that required hospitalization no Has patient had a PCN reaction occurring within the last 10 years:no If all of the above answers are "NO", then may proceed with Cephalosporin use.   . Statins     hallucinations    REVIEW OF SYSTEMS:   CONSTITUTIONAL: No fever, fatigue or weakness.  EYES: No blurred or double vision.  EARS, NOSE, AND THROAT: No tinnitus or ear pain.  RESPIRATORY: No cough, shortness of breath, wheezing or hemoptysis.  CARDIOVASCULAR: No chest pain, orthopnea, edema.  GASTROINTESTINAL: No nausea, vomiting, diarrhea or abdominal pain.  GENITOURINARY: No dysuria, hematuria.  ENDOCRINE: No polyuria, nocturia,  HEMATOLOGY: No anemia, easy bruising or bleeding SKIN: No rash or lesion. MUSCULOSKELETAL: No joint pain or arthritis.   NEUROLOGIC: No tingling, numbness, weakness.  PSYCHIATRY: No anxiety or depression.   MEDICATIONS AT HOME:  Prior to Admission medications   Medication Sig Start Date End Date Taking? Authorizing Provider  aspirin EC 81 MG tablet Take 81 mg by mouth daily.  Yes [provider]  cephALEXin (KEFLEX) 250 MG capsule Take 1 capsule (250 mg total) by mouth 4 (four) times daily for 5 days. 02/25/19 03/02/19 Yes Ojie, Jude, MD  hydrochlorothiazide (HYDRODIURIL) 12.5 MG tablet Take 12.5 mg by mouth every evening. 10/12/16  Yes [provider]  HYDROcodone-acetaminophen (NORCO/VICODIN) 5-325 MG tablet Take 1-2 tablets by mouth every 6 (six) hours as needed for severe pain. 02/25/19  Yes Ojie, Jude, MD  lisinopril  (PRINIVIL,ZESTRIL) 10 MG tablet Take 10 mg by mouth daily. 07/30/16  Yes [provider]  metFORMIN (GLUCOPHAGE) 500 MG tablet Take 500 mg by mouth 2 (two) times daily. 09/30/16  Yes [provider]  polyethylene glycol (MIRALAX) 17 g packet Take 17 g by mouth daily as needed. 02/25/19  Yes Ojie, Jude, MD      PHYSICAL EXAMINATION:   VITAL SIGNS: Blood pressure (!) 146/71, pulse 63, temperature 98.2 F (36.8 C), SpO2 100 %.  GENERAL:  73 y.o.-year-old patient lying in the bed with no acute distress.  EYES: Pupils equal, round, reactive to light and accommodation. No scleral icterus. Extraocular muscles intact.  HEENT: Head atraumatic, normocephalic. Oropharynx and nasopharynx clear.  NECK:  Supple, no jugular venous distention. No thyroid enlargement, no tenderness.  LUNGS: Normal breath sounds bilaterally, no wheezing, rales,rhonchi or crepitation. No use of accessory muscles of respiration.  CARDIOVASCULAR: S1, S2 normal. No murmurs, rubs, or gallops.  ABDOMEN: Soft, nontender, nondistended. Bowel sounds present. No organomegaly or mass.  EXTREMITIES: No pedal edema, cyanosis, or clubbing.  NEUROLOGIC: Cranial nerves II through XII are intact. Muscle strength 5/5 in all extremities. Sensation intact. Gait not checked.  Left foot second toe is amputated and dressing is in place. PSYCHIATRIC: The patient is alert and oriented x 3.  SKIN: No obvious rash, lesion, or ulcer.   LABORATORY PANEL:   CBC Recent Labs  Lab 02/24/19 0616 03/01/19 1621  WBC 10.7* 13.3*  HGB 11.9* 12.8  HCT 37.0 39.9  PLT 385 445*  MCV 90.9 91.1  MCH 29.2 29.2  MCHC 32.2 32.1  RDW 12.8 13.8   ------------------------------------------------------------------------------------------------------------------  Chemistries  Recent Labs  Lab 02/23/19 0458 02/24/19 0616 02/25/19 0705 03/01/19 1621  NA  --   --  137 139  K  --   --  3.1* 3.5  CL  --   --  104 106  CO2  --   --  25 22   GLUCOSE  --   --  141* 110*  BUN  --   --  17 22  CREATININE 0.95 1.00 1.00 1.05*  CALCIUM  --   --  8.8* 9.7  MG  --   --  2.0  --   AST  --   --   --  49*  ALT  --   --   --  30  ALKPHOS  --   --   --  136*  BILITOT  --   --   --  0.4   ------------------------------------------------------------------------------------------------------------------ estimated creatinine clearance is 47.3 mL/min (A) (by C-G formula based on SCr of 1.05 mg/dL (H)). ------------------------------------------------------------------------------------------------------------------ No results for input(s): TSH, T4TOTAL, T3FREE, THYROIDAB in the last 72 hours.  Invalid input(s): FREET3   Coagulation profile No results for input(s): INR, PROTIME in the last 168 hours. ------------------------------------------------------------------------------------------------------------------- No results for input(s): DDIMER in the last 72 hours. -------------------------------------------------------------------------------------------------------------------  Cardiac Enzymes No results for input(s): CKMB, TROPONINI, MYOGLOBIN in the last 168 hours.  Invalid input(s): CK ------------------------------------------------------------------------------------------------------------------ Invalid input(s):  POCBNP  ---------------------------------------------------------------------------------------------------------------  Urinalysis No results found for: COLORURINE, APPEARANCEUR, LABSPEC, PHURINE, GLUCOSEU, HGBUR, BILIRUBINUR, KETONESUR, PROTEINUR, UROBILINOGEN, NITRITE, LEUKOCYTESUR   RADIOLOGY: No results found.  EKG: Orders placed or performed during the hospital encounter of 10/15/16  . EKG 12-Lead  . EKG 12-Lead  . ED EKG  . ED EKG  . EKG    IMPRESSION AND PLAN:  *Cellulitis and osteomyelitis of second metatarsal in left foot Status post amputation by podiatry last week, sent in for revision  of surgery and more debridement. As advised by podiatry, will give broad-spectrum IV antibiotics and keep n.p.o. for possible surgery tomorrow morning.  *Diabetes Hold oral metformin and keep on sliding scale coverage  *Hypertension With use of vancomycin I would like to avoid hydrochlorothiazide but continue lisinopril for now.  All the records are reviewed and case discussed with ED provider. Management plans discussed with the patient, family and they are in agreement.  CODE STATUS:DNR    Code Status Orders  (From admission, onward)         Start     Ordered   03/01/19 1652  Do not attempt resuscitation (DNR)  Continuous    Question Answer Comment  In the event of cardiac or respiratory ARREST Do not call a "code blue"   In the event of cardiac or respiratory ARREST Do not perform Intubation, CPR, defibrillation or ACLS   In the event of cardiac or respiratory ARREST Use medication by any route, position, wound care, and other measures to relive pain and suffering. May use oxygen, suction and manual treatment of airway obstruction as needed for comfort.   Comments nurse may pronounce      03/01/19 1651        Code Status History    Date Active Date Inactive Code Status Order ID Comments User Context   02/21/2019 1250 02/25/2019 1859 DNR [id number]  Demetrios Loll, MD Inpatient   10/15/2016 2107 10/19/2016 1624 DNR [id number]  Loletha Grayer, MD ED   Advance Care Planning Activity    Advance Directive Documentation     Most Recent Value  Type of Advance Directive  Living will  Pre-existing out of facility DNR order (yellow form or pink MOST form)  -  "MOST" Form in Place?  -       TOTAL TIME TAKING CARE OF THIS PATIENT: 50 minutes.  Patient's husband was present in the room during my visit.  Vaughan Basta M.D on 03/01/2019   Between 7am to 6pm - Pager - [phone]  After 6pm go to [email or web address] - password [email or web address] [facility]  Office   [phone]  CC: Primary care physician; Baxter Hire, MD   Note: This dictation was prepared with Dragon dictation along with smaller phrase technology. Any transcriptional errors that result from this process are unintentional.

## 2019-03-01 NOTE — Consult Note (Signed)
Pharmacy Antibiotic Note  Joann [name] is a 73 y.o. female admitted on 03/01/2019 with Wound infection.  Pharmacy has been consulted for cefepime and vancomycin dosing.  Plan: Cefepime 2 g q12H   Will give vancomycin 2000 mg x 1 loading dose followed by vancomycin 750 mg q24. Predicted AUC 498. Goal AUC 400-550. Css min 13. Scr used 1.05. Plan to order levels in 4-5 days.     Temp (24hrs), Avg:98.2 F (36.8 C), Min:98.2 F (36.8 C), Max:98.2 F (36.8 C)  Recent Labs  Lab 02/23/19 0458 02/24/19 0616 02/25/19 0705 03/01/19 1621  WBC  --  10.7*  --  13.3*  CREATININE 0.95 1.00 1.00 1.05*    Estimated Creatinine Clearance: 47.3 mL/min (A) (by C-G formula based on SCr of 1.05 mg/dL (H)).    Allergies  Allergen Reactions  . Neosporin [Neomycin-Bacitracin Zn-Polymyx]     "it just don't agree with me"   . Penicillins Itching    Has patient had a PCN reaction causing immediate rash, facial/tongue/throat swelling, SOB or lightheadedness with hypotension: no Has patient had a PCN reaction causing severe rash involving mucus membranes or skin necrosis: no Has patient had a PCN reaction that required hospitalization no Has patient had a PCN reaction occurring within the last 10 years:no If all of the above answers are "NO", then may proceed with Cephalosporin use.   . Statins     hallucinations    Antimicrobials this admission: 8/6 cefepime >>  8/6 vancomycin >>   Dose adjustments this admission: None  Microbiology results: None Hx of MSSA  Thank you for allowing pharmacy to be a part of this patient's care.  Oswald Hillock, PharmD, BCPS 03/01/2019 4:59 PM

## 2019-03-01 NOTE — Progress Notes (Signed)
Family Meeting Note  Advance Directive:yes  Today a meeting took place with the Patient and spouse.    The following clinical team members were present during this meeting:MD  The following were discussed:Patient's diagnosis:DM, Htn , Osteomyelitis , Patient's progosis: Unable to determine and Goals for treatment: DNR  Additional follow-up to be provided: Podiatry  Time spent during discussion:20 minutes  Vaughan Basta, MD

## 2019-03-01 NOTE — Consult Note (Signed)
NAME: Joann [name]  DOB: 10/31/1945  MRN: [id number]  Date/Time: 03/01/2019 7:48 PM  REQUESTING PROVIDER: Dr. Vickki Muff Subjective:  REASON FOR CONSULT: Left foot infection ? Joann [name] is a 73 y.o. female with a history of diabetes mellitus, hypertension is admitted from the podiatrist office for worsening left foot infection.  Patient was recently in the hospital between 02/21/2019 until 02/25/2019 for infection of the second toe of the left foot and underwent on 02/23/2019 amputation of the left second toe at the metatarsophalangeal joint for underlying osteomyelitis.  The cultures had staph aureus, strep viridans, The pathology showed osteomyelitis and the in the resected margin had residual osteo-.  Patient after being treated with IV antibiotics in the hospital was sent home on p.o. Keflex.  She had a follow-up appointment with Dr. Vickki Muff on 03/01/2019 and was found to have residual infection with dehiscence of the sutures and was admitted to the hospital for surgery.  She does not have any fever or chills or pain in the foot.  She says she has not been weightbearing.  In March 2018 she had infection of the left fifth toe and underwent resection of the fifth metatarsal head as well as the base of the proximal phalanx.  Cultures had anaerobes and Streptococcus anginosis.  She was treated with IV vancomycin, cefepime and p.o. Flagyl for 2 weeks and then switched to oral Levaquin.  By Dr. Ola Spurr Past Medical History:  Diagnosis Date  . Breast cancer ([place]) 2007   right breast lumpectomy with 36 rad tx  . Breast mass 11/23/2016   6 o'clock  . Diabetes mellitus without complication ([place])   . Hypertension     Past Surgical History:  Procedure Laterality Date  . AMPUTATION Left 02/23/2019   Procedure: AMPUTATION LEFT 2nd Toe;  Surgeon: Samara Deist, DPM;  Location: [facility] ORS;  Service: Podiatry;  Laterality: Left;  . BREAST BIOPSY Right 2007   positive.  Lumpectomy with rad tx  . BREAST  LUMPECTOMY Right 2007  . INCISION AND DRAINAGE Left 10/16/2016   Procedure: INCISION AND DRAINAGE;  Surgeon: Albertine Patricia, DPM;  Location: [facility] ORS;  Service: Podiatry;  Laterality: Left;    Social history Patient lives with her husband Non-smoker No alcohol  no pets at home   Family History  Problem Relation Age of Onset  . Dementia Mother   . CAD Mother   . CAD Father   . Deep vein thrombosis Father   . Breast cancer Neg Hx    Allergies  Allergen Reactions  . Neosporin [Neomycin-Bacitracin Zn-Polymyx]     "it just don't agree with me"   . Penicillins Itching    Has patient had a PCN reaction causing immediate rash, facial/tongue/throat swelling, SOB or lightheadedness with hypotension: no Has patient had a PCN reaction causing severe rash involving mucus membranes or skin necrosis: no Has patient had a PCN reaction that required hospitalization no Has patient had a PCN reaction occurring within the last 10 years:no If all of the above answers are "NO", then may proceed with Cephalosporin use.   . Statins     hallucinations   ? Current Facility-Administered Medications  Medication Dose Route Frequency Provider Last Rate Last Dose  . 0.9 %  sodium chloride infusion   Intravenous PRN Vaughan Basta, MD      . aspirin EC tablet 81 mg  81 mg Oral Daily Vaughan Basta, MD      . ceFEPIme (MAXIPIME) 2 g in sodium chloride 0.9 %  100 mL IVPB  2 g Intravenous Q12H Vaughan Basta, MD      . chlorhexidine (HIBICLENS) 4 % liquid 4 application  60 mL Topical Once Samara Deist, DPM      . docusate sodium (COLACE) capsule 100 mg  100 mg Oral BID PRN Vaughan Basta, MD      . feeding supplement (ENSURE PRE-SURGERY) liquid 296 mL  296 mL Oral Once Samara Deist, DPM      . heparin injection 5,000 Units  5,000 Units Subcutaneous Q8H Vaughan Basta, MD      . HYDROcodone-acetaminophen (NORCO/VICODIN) 5-325 MG per tablet 1-2 tablet  1-2 tablet Oral  Q6H PRN Vaughan Basta, MD      . insulin aspart (novoLOG) injection 0-5 Units  0-5 Units Subcutaneous QHS Vaughan Basta, MD      . Derrill Memo ON 03/02/2019] insulin aspart (novoLOG) injection 0-9 Units  0-9 Units Subcutaneous TID WC Vaughan Basta, MD      . lisinopril (ZESTRIL) tablet 10 mg  10 mg Oral Daily Vaughan Basta, MD      . polyethylene glycol (MIRALAX / GLYCOLAX) packet 17 g  17 g Oral Daily PRN Vaughan Basta, MD      . povidone-iodine 10 % swab 2 application  2 application Topical Once Samara Deist, DPM      . vancomycin (VANCOCIN) 2,000 mg in sodium chloride 0.9 % 500 mL IVPB  2,000 mg Intravenous Once Vaughan Basta, MD      . Derrill Memo ON 03/02/2019] vancomycin (VANCOCIN) IVPB 750 mg/150 ml premix  750 mg Intravenous Q24H Vaughan Basta, MD         Abtx:  Anti-infectives (From admission, onward)   Start     Dose/Rate Route Frequency Ordered Stop   03/02/19 2000  vancomycin (VANCOCIN) IVPB 750 mg/150 ml premix     750 mg 150 mL/hr over 60 Minutes Intravenous Every 24 hours 03/01/19 1705     03/01/19 2000  vancomycin (VANCOCIN) 2,000 mg in sodium chloride 0.9 % 500 mL IVPB     2,000 mg 250 mL/hr over 120 Minutes Intravenous  Once 03/01/19 1705     03/01/19 1800  ceFEPIme (MAXIPIME) 2 g in sodium chloride 0.9 % 100 mL IVPB     2 g 200 mL/hr over 30 Minutes Intravenous Every 12 hours 03/01/19 1705        REVIEW OF SYSTEMS:  Const: negative fever, negative chills, negative weight loss Eyes: negative diplopia or visual changes, negative eye pain ENT: negative coryza, negative sore throat Resp: negative cough, hemoptysis, dyspnea Cards: negative for chest pain, palpitations, lower extremity edema GU: negative for frequency, dysuria and hematuria GI: Negative for abdominal pain, diarrhea, bleeding, constipation Skin: negative for rash and pruritus Heme: negative for easy bruising and gum/nose bleeding MS: negative for  myalgias, arthralgias, back pain and muscle weakness Neurolo:negative for headaches, dizziness, vertigo, memory problems  Psych: negative for feelings of anxiety, depression  Allergy/Immunology-penicillin allergy got hives and lip swelling as a child Objective:  VITALS:  BP (!) 150/69 (BP Location: Left Arm)   Pulse 60   Temp 98.3 F (36.8 C) (Oral)   Resp 19   Ht 5\' 2"  (1.575 m)   Wt 86.5 kg   SpO2 97%   BMI 34.88 kg/m  PHYSICAL EXAM:  General: Alert, cooperative, no distress, appears stated age.  Head: Normocephalic, without obvious abnormality, atraumatic. Eyes: Conjunctivae clear, anicteric sclerae. Pupils are equal ENT Nares normal. No drainage or sinus tenderness. Lips, mucosa, and tongue normal.  No Thrush Neck: Supple, symmetrical, no adenopathy, thyroid: non tender no carotid bruit and no JVD. Back: No CVA tenderness. Lungs: Clear to auscultation bilaterally. No Wheezing or Rhonchi. No rales. Heart: Regular rate and rhythm, no murmur, rub or gallop. Abdomen: Soft, non-tender,not distended. Bowel sounds normal. No masses Extremities: Left foot examination shows amputated second toe.  The site of the amputation the sutures have been dehisced and there is some redness around it.  Left fifth toe partial amputation of the fifth metatarsal head as well as the proximal phalanx of the fifth toe in the past  Skin: No rashes or lesions. Or bruising Lymph: Cervical, supraclavicular normal. Neurologic: Grossly non-focal Pertinent Labs Lab Results CBC    Component Value Date/Time   WBC 13.3 (H) 03/01/2019 1621   RBC 4.38 03/01/2019 1621   HGB 12.8 03/01/2019 1621   HGB 14.9 10/02/2013 1336   HCT 39.9 03/01/2019 1621   HCT 44.0 10/02/2013 1336   PLT 445 (H) 03/01/2019 1621   PLT 316 10/02/2013 1336   MCV 91.1 03/01/2019 1621   MCV 88 10/02/2013 1336   MCH 29.2 03/01/2019 1621   MCHC 32.1 03/01/2019 1621   RDW 13.8 03/01/2019 1621   RDW 13.7 10/02/2013 1336   LYMPHSABS 1.6  11/01/2016 0800   MONOABS 0.9 11/01/2016 0800   EOSABS 0.4 11/01/2016 0800   BASOSABS 0.0 11/01/2016 0800    CMP Latest Ref Rng & Units 03/01/2019 02/25/2019 02/24/2019  Glucose 70 - 99 mg/dL 110(H) 141(H) -  BUN 8 - 23 mg/dL 22 17 -  Creatinine 0.44 - 1.00 mg/dL 1.05(H) 1.00 1.00  Sodium 135 - 145 mmol/L 139 137 -  Potassium 3.5 - 5.1 mmol/L 3.5 3.1(L) -  Chloride 98 - 111 mmol/L 106 104 -  CO2 22 - 32 mmol/L 22 25 -  Calcium 8.9 - 10.3 mg/dL 9.7 8.8(L) -  Total Protein 6.5 - 8.1 g/dL 8.3(H) - -  Total Bilirubin 0.3 - 1.2 mg/dL 0.4 - -  Alkaline Phos 38 - 126 U/L 136(H) - -  AST 15 - 41 U/L 49(H) - -  ALT 0 - 44 U/L 30 - -      Microbiology: Recent Results (from the past 240 hour(s))  Aerobic/Anaerobic Culture (surgical/deep wound)     Status: None (Preliminary result)   Collection Time: 02/21/19  9:02 AM   Specimen: Foot; Wound  Result Value Ref Range Status   Specimen Description   Final    FOOT LEFT Performed at [facility], [place] [address] [place], [place] [address]    Special Requests   Final    NONE Performed at [facility], [address] [place], [place] [address]    Gram Stain   Final    FEW WBC PRESENT, PREDOMINANTLY PMN FEW GRAM POSITIVE COCCI IN PAIRS IN CLUSTERS    Culture   Final    RARE STAPHYLOCOCCUS AUREUS NO ANAEROBES ISOLATED CULTURE REINCUBATED FOR BETTER GROWTH Performed at [facility], [address], [place], [place] [address]    Report Status PENDING  Incomplete   Organism ID, Bacteria STAPHYLOCOCCUS AUREUS  Final      Susceptibility   Staphylococcus aureus - MIC*    CIPROFLOXACIN <=0.5 SENSITIVE Sensitive     ERYTHROMYCIN <=0.25 SENSITIVE Sensitive     GENTAMICIN <=0.5 SENSITIVE Sensitive     OXACILLIN 0.5 SENSITIVE Sensitive     TETRACYCLINE <=1 SENSITIVE Sensitive     VANCOMYCIN 1 SENSITIVE Sensitive  TRIMETH/SULFA <=10 SENSITIVE Sensitive     CLINDAMYCIN <=0.25 SENSITIVE Sensitive     RIFAMPIN <=0.5  SENSITIVE Sensitive     Inducible Clindamycin NEGATIVE Sensitive     * RARE STAPHYLOCOCCUS AUREUS  SARS Coronavirus 2 (CEPHEID- Performed in [place] hospital lab), Hosp Order     Status: None   Collection Time: 02/21/19 11:50 AM   Specimen: Nasopharyngeal Swab  Result Value Ref Range Status   SARS Coronavirus 2 NEGATIVE NEGATIVE Final    Comment: (NOTE) If result is NEGATIVE SARS-CoV-2 target nucleic acids are NOT DETECTED. The SARS-CoV-2 RNA is generally detectable in upper and lower  respiratory specimens during the acute phase of infection. The lowest  concentration of SARS-CoV-2 viral copies this assay can detect is 250  copies / mL. A negative result does not preclude SARS-CoV-2 infection  and should not be used as the sole basis for treatment or other  patient management decisions.  A negative result may occur with  improper specimen collection / handling, submission of specimen other  than nasopharyngeal swab, presence of viral mutation(s) within the  areas targeted by this assay, and inadequate number of viral copies  (<250 copies / mL). A negative result must be combined with clinical  observations, patient history, and epidemiological information. If result is POSITIVE SARS-CoV-2 target nucleic acids are DETECTED. The SARS-CoV-2 RNA is generally detectable in upper and lower  respiratory specimens dur ing the acute phase of infection.  Positive  results are indicative of active infection with SARS-CoV-2.  Clinical  correlation with patient history and other diagnostic information is  necessary to determine patient infection status.  Positive results do  not rule out bacterial infection or co-infection with other viruses. If result is PRESUMPTIVE POSTIVE SARS-CoV-2 nucleic acids MAY BE PRESENT.   A presumptive positive result was obtained on the submitted specimen  and confirmed on repeat testing.  While 2019 novel coronavirus  (SARS-CoV-2) nucleic acids may be present  in the submitted sample  additional confirmatory testing may be necessary for epidemiological  and / or clinical management purposes  to differentiate between  SARS-CoV-2 and other Sarbecovirus currently known to infect humans.  If clinically indicated additional testing with an alternate test  methodology [phone]) is advised. The SARS-CoV-2 RNA is generally  detectable in upper and lower respiratory sp ecimens during the acute  phase of infection. The expected result is Negative. Fact Sheet for Patients:  StrictlyIdeas.no Fact Sheet for Healthcare Providers: BankingDealers.co.za This test is not yet approved or cleared by the [place] FDA and has been authorized for detection and/or diagnosis of SARS-CoV-2 by FDA under an Emergency Use Authorization (EUA).  This EUA will remain in effect (meaning this test can be used) for the duration of the COVID-19 declaration under Section 564(b)(1) of the Act, 21 U.S.C. section 360bbb-3(b)(1), unless the authorization is terminated or revoked sooner. Performed at [facility], [address] [place], [place] [address]   Aerobic/Anaerobic Culture (surgical/deep wound)     Status: None   Collection Time: 02/23/19  1:30 PM   Specimen: [facility] Other; Wound  Result Value Ref Range Status   Specimen Description   Final    BONE Performed at [facility], [address] [place], [place] [address]    Special Requests   Final    LEFT SECOND TOE BONE CULTURE Performed at [facility], [address] [place], [place] [address]    Gram Stain   Final    ABUNDANT  WBC PRESENT, PREDOMINANTLY PMN MODERATE GRAM NEGATIVE RODS MODERATE GRAM POSITIVE COCCI    Culture   Final    RARE STAPHYLOCOCCUS AUREUS FEW VIRIDANS STREPTOCOCCUS FEW STREPTOCOCCUS ANGINOSIS NO ANAEROBES ISOLATED Performed at [facility], [place] [address] [place], [place] [address]    Report Status  02/28/2019 FINAL  Final   Organism ID, Bacteria STAPHYLOCOCCUS AUREUS  Final   Organism ID, Bacteria STREPTOCOCCUS ANGINOSIS  Final      Susceptibility   Staphylococcus aureus - MIC*    CIPROFLOXACIN <=0.5 SENSITIVE Sensitive     ERYTHROMYCIN <=0.25 SENSITIVE Sensitive     GENTAMICIN <=0.5 SENSITIVE Sensitive     OXACILLIN <=0.25 SENSITIVE Sensitive     TETRACYCLINE <=1 SENSITIVE Sensitive     VANCOMYCIN 1 SENSITIVE Sensitive     TRIMETH/SULFA <=10 SENSITIVE Sensitive     CLINDAMYCIN <=0.25 SENSITIVE Sensitive     RIFAMPIN <=0.5 SENSITIVE Sensitive     Inducible Clindamycin NEGATIVE Sensitive     * RARE STAPHYLOCOCCUS AUREUS   Streptococcus anginosis - MIC*    ERYTHROMYCIN <=0.12 SENSITIVE Sensitive     LEVOFLOXACIN 0.5 SENSITIVE Sensitive     VANCOMYCIN 0.5 SENSITIVE Sensitive     * FEW STREPTOCOCCUS ANGINOSIS  SARS Coronavirus 2 [facility] order, Performed in [place] hospital lab) Nasopharyngeal Nasopharyngeal Swab     Status: None   Collection Time: 03/01/19  1:48 PM   Specimen: Nasopharyngeal Swab  Result Value Ref Range Status   SARS Coronavirus 2 NEGATIVE NEGATIVE Final    Comment: (NOTE) If result is NEGATIVE SARS-CoV-2 target nucleic acids are NOT DETECTED. The SARS-CoV-2 RNA is generally detectable in upper and lower  respiratory specimens during the acute phase of infection. The lowest  concentration of SARS-CoV-2 viral copies this assay can detect is 250  copies / mL. A negative result does not preclude SARS-CoV-2 infection  and should not be used as the sole basis for treatment or other  patient management decisions.  A negative result may occur with  improper specimen collection / handling, submission of specimen other  than nasopharyngeal swab, presence of viral mutation(s) within the  areas targeted by this assay, and inadequate number of viral copies  (<250 copies / mL). A negative result must be combined with clinical  observations, patient history, and  epidemiological information. If result is POSITIVE SARS-CoV-2 target nucleic acids are DETECTED. The SARS-CoV-2 RNA is generally detectable in upper and lower  respiratory specimens dur ing the acute phase of infection.  Positive  results are indicative of active infection with SARS-CoV-2.  Clinical  correlation with patient history and other diagnostic information is  necessary to determine patient infection status.  Positive results do  not rule out bacterial infection or co-infection with other viruses. If result is PRESUMPTIVE POSTIVE SARS-CoV-2 nucleic acids MAY BE PRESENT.   A presumptive positive result was obtained on the submitted specimen  and confirmed on repeat testing.  While 2019 novel coronavirus  (SARS-CoV-2) nucleic acids may be present in the submitted sample  additional confirmatory testing may be necessary for epidemiological  and / or clinical management purposes  to differentiate between  SARS-CoV-2 and other Sarbecovirus currently known to infect humans.  If clinically indicated additional testing with an alternate test  methodology [phone]) is advised. The SARS-CoV-2 RNA is generally  detectable in upper and lower respiratory sp ecimens during the acute  phase of infection. The expected result is Negative. Fact Sheet for Patients:  StrictlyIdeas.no Fact Sheet for Healthcare Providers:  BankingDealers.co.za This test is not yet approved or cleared by the [place] and has been authorized for detection and/or diagnosis of SARS-CoV-2 by FDA under an Emergency Use Authorization (EUA).  This EUA will remain in effect (meaning this test can be used) for the duration of the COVID-19 declaration under Section 564(b)(1) of the Act, 21 U.S.C. section 360bbb-3(b)(1), unless the authorization is terminated or revoked sooner. Performed at [facility], [address], [place], [place] [address]      IMAGING RESULTS: No imaging in this admission I have personally reviewed the films  Impression/Recommendation 73 y.o. female with a history of diabetes mellitus, hypertension is admitted from the podiatrist office for worsening left foot infection.  Patient was recently in the hospital between 02/21/2019 until 02/25/2019 for infection of the second toe of the left foot and underwent on 02/23/2019 amputation of the left second toe at the metatarsophalangeal joint for underlying osteomyelitis.  The cultures had staph aureus, strep viridans, The pathology showed osteomyelitis and the in the resected margin had residual osteo-.  Patient after being treated with IV antibiotics in the hospital was sent home on p.o. Keflex.  She had a follow-up appointment with Dr. Vickki Muff on 03/01/2019 and was found to have residual infection with dehiscence of the sutures and was admitted to the hospital for surgery.? ? ?Left foot infection.  Status post second toe amputation on 02/22/2019.  Has residual infection and hence is going for surgery tomorrow. She is currently on cefepime and vancomycin. The previous cultures had grown staph aureus and strep viridans.  So we can likely de-escalate antibiotics to cefazolin after surgery.  Diabetes mellitus on metformin  Hypertension on lisinopril  Right CA breast treated with lumpectomy and radiation in the past. ___________________________________________________ Discussed with patient, and her husband. Note:  This document was prepared using Dragon voice recognition software and may include unintentional dictation errors.

## 2019-03-01 NOTE — Consult Note (Signed)
ORTHOPAEDIC CONSULTATION  REQUESTING PHYSICIAN: Vaughan Basta, *  Chief Complaint: Left foot infection  HPI: Joann [name] is a 73 y.o. female who complains of recent left foot infection.  Underwent recent second toe amputation by myself.  Presented to the outpatient clinic today with noted open drainage and x-ray showed area of infection to the distal second metatarsal.  Admitted for IV antibiotics and debridement tomorrow.  Past Medical History:  Diagnosis Date  . Breast cancer ([place]) 2007   right breast lumpectomy with 36 rad tx  . Breast mass 11/23/2016   6 o'clock  . Diabetes mellitus without complication ([place])   . Hypertension    Past Surgical History:  Procedure Laterality Date  . AMPUTATION Left 02/23/2019   Procedure: AMPUTATION LEFT 2nd Toe;  Surgeon: Samara Deist, DPM;  Location: [facility] ORS;  Service: Podiatry;  Laterality: Left;  . BREAST BIOPSY Right 2007   positive.  Lumpectomy with rad tx  . BREAST LUMPECTOMY Right 2007  . INCISION AND DRAINAGE Left 10/16/2016   Procedure: INCISION AND DRAINAGE;  Surgeon: Albertine Patricia, DPM;  Location: [facility] ORS;  Service: Podiatry;  Laterality: Left;   Social History   Socioeconomic History  . Marital status: Married    Spouse name: Not on file  . Number of children: Not on file  . Years of education: Not on file  . Highest education level: Not on file  Occupational History  . Not on file  Social Needs  . Financial resource strain: Not on file  . Food insecurity    Worry: Not on file    Inability: Not on file  . Transportation needs    Medical: Not on file    Non-medical: Not on file  Tobacco Use  . Smoking status: Never Smoker  . Smokeless tobacco: Never Used  Substance and Sexual Activity  . Alcohol use: No  . Drug use: No  . Sexual activity: Not on file  Lifestyle  . Physical activity    Days per week: Not on file    Minutes per session: Not on file  . Stress: Not on file  Relationships  .  Social Herbalist on phone: Not on file    Gets together: Not on file    Attends religious service: Not on file    Active member of club or organization: Not on file    Attends meetings of clubs or organizations: Not on file    Relationship status: Not on file  Other Topics Concern  . Not on file  Social History Narrative  . Not on file   Family History  Problem Relation Age of Onset  . Dementia Mother   . CAD Mother   . CAD Father   . Deep vein thrombosis Father   . Breast cancer Neg Hx    Allergies  Allergen Reactions  . Neosporin [Neomycin-Bacitracin Zn-Polymyx]     "it just don't agree with me"   . Penicillins Itching    Has patient had a PCN reaction causing immediate rash, facial/tongue/throat swelling, SOB or lightheadedness with hypotension: no Has patient had a PCN reaction causing severe rash involving mucus membranes or skin necrosis: no Has patient had a PCN reaction that required hospitalization no Has patient had a PCN reaction occurring within the last 10 years:no If all of the above answers are "NO", then may proceed with Cephalosporin use.   . Statins     hallucinations   Prior to Admission medications  Medication Sig Start Date End Date Taking? Authorizing Provider  aspirin EC 81 MG tablet Take 81 mg by mouth daily.   Yes [provider]  cephALEXin (KEFLEX) 250 MG capsule Take 1 capsule (250 mg total) by mouth 4 (four) times daily for 5 days. 02/25/19 03/02/19 Yes Ojie, Jude, MD  hydrochlorothiazide (HYDRODIURIL) 12.5 MG tablet Take 12.5 mg by mouth every evening. 10/12/16  Yes [provider]  HYDROcodone-acetaminophen (NORCO/VICODIN) 5-325 MG tablet Take 1-2 tablets by mouth every 6 (six) hours as needed for severe pain. 02/25/19  Yes Ojie, Jude, MD  lisinopril (PRINIVIL,ZESTRIL) 10 MG tablet Take 10 mg by mouth daily. 07/30/16  Yes [provider]  metFORMIN (GLUCOPHAGE) 500 MG tablet Take 500 mg by mouth 2 (two) times daily.  09/30/16  Yes [provider]  polyethylene glycol (MIRALAX) 17 g packet Take 17 g by mouth daily as needed. 02/25/19  Yes Stark Jock Jude, MD   No results found.  Positive ROS: All other systems have been reviewed and were otherwise negative with the exception of those mentioned in the HPI and as above.  12 point ROS was performed.  Physical Exam: General: Alert and oriented.  No apparent distress.  Vascular:  Left foot:Dorsalis Pedis:  present Posterior Tibial:  diminished  Right foot: Dorsalis Pedis:  present Posterior Tibial:  diminished  Neuro:absent protective sensation  Derm: Open wound at the second toe amputation site.  This probes down to bone.  The second metatarsal head is exposed in the very distal central aspect of the wound.  Surrounding erythema.  Scant purulent drainage is noted.  Ortho/MS: Patient is status post left second toe amputation by myself  Assessment: Osteomyelitis second metatarsal Diabetes with neuropathy  Plan: The plan will be for debridement of the second metatarsal.  Unfortunately the infection I believe across the cartilaginous cap into the second metatarsal head.  We will plan for deep cultures and biopsy as well.  Will very possibly need IV antibiotics.  Will consult infectious disease.  Discussed with patient surgical intervention tomorrow and plan for that.  All questions were answered.  Consent has been given.    Elesa Hacker, DPM Cell [phone]   03/01/2019 5:57 PM

## 2019-03-02 ENCOUNTER — Encounter
Admission: AD | Disposition: A | Discharge status: Home Health | Payer: Self-pay | Source: Ambulatory Visit | Attending: Internal Medicine

## 2019-03-02 ENCOUNTER — Inpatient Hospital Stay: Payer: Medicare Other | Admitting: Anesthesiology

## 2019-03-02 HISTORY — PX: IRRIGATION AND DEBRIDEMENT FOOT: SHX6602

## 2019-03-02 LAB — GLUCOSE, CAPILLARY
Glucose-Capillary: 137 mg/dL — ABNORMAL HIGH (ref 70–99)
Glucose-Capillary: 136 mg/dL — ABNORMAL HIGH (ref 70–99)
Glucose-Capillary: 152 mg/dL — ABNORMAL HIGH (ref 70–99)
Glucose-Capillary: 143 mg/dL — ABNORMAL HIGH (ref 70–99)
Glucose-Capillary: 133 mg/dL — ABNORMAL HIGH (ref 70–99)

## 2019-03-02 LAB — BASIC METABOLIC PANEL
Anion gap: 8 (ref 5–15)
BUN: 18 mg/dL (ref 8–23)
CO2: 23 mmol/L (ref 22–32)
Calcium: 8.8 mg/dL — ABNORMAL LOW (ref 8.9–10.3)
Chloride: 107 mmol/L (ref 98–111)
Creatinine, Ser: 0.94 mg/dL (ref 0.44–1.00)
GFR calc Af Amer: >60 mL/min (ref >60)
GFR calc non Af Amer: 59 mL/min — ABNORMAL LOW (ref >60)
Glucose, Bld: 218 mg/dL — ABNORMAL HIGH (ref 70–99)
Potassium: 3.1 mmol/L — ABNORMAL LOW (ref 3.5–5.1)
Sodium: 138 mmol/L (ref 135–145)

## 2019-03-02 LAB — CBC
HCT: 37.5 % (ref 36.0–46.0)
Hemoglobin: 12 g/dL (ref 12.0–15.0)
MCH: 29.6 pg (ref 26.0–34.0)
MCHC: 32 g/dL (ref 30.0–36.0)
MCV: 92.4 fL (ref 80.0–100.0)
Platelets: 333 10*3/uL (ref 150–400)
RBC: 4.06 MIL/uL (ref 3.87–5.11)
RDW: 13.5 % (ref 11.5–15.5)
WBC: 13.9 10*3/uL — ABNORMAL HIGH (ref 4.0–10.5)
nRBC: 0 % (ref 0.0–0.2)

## 2019-03-02 SURGERY — IRRIGATION AND DEBRIDEMENT FOOT
Anesthesia: General | Site: Foot | Laterality: Left

## 2019-03-02 MED ORDER — PROPOFOL 10 MG/ML IV BOLUS
INTRAVENOUS | Status: DC | PRN
  Administered 2019-03-02: 30 mg via INTRAVENOUS

## 2019-03-02 MED ORDER — LIDOCAINE HCL (PF) 2 % IJ SOLN
INTRAMUSCULAR | Status: AC
  Filled 2019-03-02: qty 10

## 2019-03-02 MED ORDER — CEFAZOLIN SODIUM-DEXTROSE 2-4 GM/100ML-% IV SOLN
2.0000 g | Freq: Three times a day (TID) | INTRAVENOUS | Status: DC
  Administered 2019-03-02 – 2019-03-05 (×9): 2 g via INTRAVENOUS
  Filled 2019-03-02 (×11): qty 100

## 2019-03-02 MED ORDER — PROPOFOL 500 MG/50ML IV EMUL
INTRAVENOUS | Status: AC
  Filled 2019-03-02: qty 50

## 2019-03-02 MED ORDER — POTASSIUM CHLORIDE CRYS ER 20 MEQ PO TBCR
40.0000 meq | EXTENDED_RELEASE_TABLET | Freq: Once | ORAL | Status: AC
  Administered 2019-03-02: 16:00:00 40 meq via ORAL
  Filled 2019-03-02: qty 2

## 2019-03-02 MED ORDER — ENOXAPARIN SODIUM 40 MG/0.4ML ~~LOC~~ SOLN
40.0000 mg | SUBCUTANEOUS | Status: DC
  Administered 2019-03-02 – 2019-03-04 (×3): 40 mg via SUBCUTANEOUS
  Filled 2019-03-02 (×3): qty 0.4

## 2019-03-02 MED ORDER — MIDAZOLAM HCL 2 MG/2ML IJ SOLN
INTRAMUSCULAR | Status: DC | PRN
  Administered 2019-03-02: 1 mg via INTRAVENOUS

## 2019-03-02 MED ORDER — FENTANYL CITRATE (PF) 100 MCG/2ML IJ SOLN
INTRAMUSCULAR | Status: AC
  Filled 2019-03-02: qty 2

## 2019-03-02 MED ORDER — LIDOCAINE HCL 1 % IJ SOLN
INTRAMUSCULAR | Status: DC | PRN
  Administered 2019-03-02: 10 mL

## 2019-03-02 MED ORDER — ACETAMINOPHEN 325 MG PO TABS
ORAL_TABLET | ORAL | Status: AC
  Administered 2019-03-02: 09:00:00
  Filled 2019-03-02: qty 2

## 2019-03-02 MED ORDER — MIDAZOLAM HCL 2 MG/2ML IJ SOLN
INTRAMUSCULAR | Status: AC
  Filled 2019-03-02: qty 2

## 2019-03-02 MED ORDER — ONDANSETRON HCL 4 MG/2ML IJ SOLN: 4.0000 mg | Freq: Once | INTRAMUSCULAR | Status: DC | PRN

## 2019-03-02 MED ORDER — FENTANYL CITRATE (PF) 100 MCG/2ML IJ SOLN
INTRAMUSCULAR | Status: DC | PRN
  Administered 2019-03-02: 50 ug via INTRAVENOUS

## 2019-03-02 MED ORDER — BUPIVACAINE HCL 0.5 % IJ SOLN
INTRAMUSCULAR | Status: DC | PRN
  Administered 2019-03-02: 10 mL

## 2019-03-02 MED ORDER — PROPOFOL 500 MG/50ML IV EMUL
INTRAVENOUS | Status: DC | PRN
  Administered 2019-03-02: 50 ug/kg/min via INTRAVENOUS

## 2019-03-02 MED ORDER — ACETAMINOPHEN 325 MG PO TABS
650.0000 mg | ORAL_TABLET | Freq: Once | ORAL | Status: AC
  Administered 2019-03-02: 650 mg via ORAL

## 2019-03-02 MED ORDER — FENTANYL CITRATE (PF) 100 MCG/2ML IJ SOLN: 25.0000 ug | INTRAMUSCULAR | Status: DC | PRN

## 2019-03-02 MED ORDER — LIDOCAINE-EPINEPHRINE 1 %-1:100000 IJ SOLN: INTRAMUSCULAR | Status: DC | PRN

## 2019-03-02 SURGICAL SUPPLY — 62 items
BLADE OSC/SAGITTAL MD 5.5X18 (BLADE) IMPLANT
BLADE OSCILLATING/SAGITTAL (BLADE) ×1
BLADE SW THK.38XMED LNG THN (BLADE) IMPLANT
BNDG COHESIVE 4X5 TAN STRL (GAUZE/BANDAGES/DRESSINGS) ×2 IMPLANT
BNDG COHESIVE 6X5 TAN STRL LF (GAUZE/BANDAGES/DRESSINGS) ×2 IMPLANT
BNDG CONFORM 2 STRL LF (GAUZE/BANDAGES/DRESSINGS) ×2 IMPLANT
BNDG CONFORM 3 STRL LF (GAUZE/BANDAGES/DRESSINGS) ×2 IMPLANT
BNDG ELASTIC 4X5.8 VLCR STR LF (GAUZE/BANDAGES/DRESSINGS) ×2 IMPLANT
BNDG ESMARK 4X12 TAN STRL LF (GAUZE/BANDAGES/DRESSINGS) ×2 IMPLANT
BNDG GAUZE 4.5X4.1 6PLY STRL (MISCELLANEOUS) ×2 IMPLANT
CANISTER SUCT 1200ML W/VALVE (MISCELLANEOUS) ×2 IMPLANT
CANISTER SUCT 3000ML PPV (MISCELLANEOUS) ×2 IMPLANT
COVER WAND RF STERILE (DRAPES) ×2 IMPLANT
CUFF TOURN SGL QUICK 12 (TOURNIQUET CUFF) IMPLANT
CUFF TOURN SGL QUICK 18X4 (TOURNIQUET CUFF) ×1 IMPLANT
DRAPE FLUOR MINI C-ARM 54X84 (DRAPES) ×1 IMPLANT
DRAPE XRAY CASSETTE 23X24 (DRAPES) IMPLANT
DRESSING ALLEVYN 4X4 (MISCELLANEOUS) IMPLANT
DURAPREP 26ML APPLICATOR (WOUND CARE) ×2 IMPLANT
ELECT REM PT RETURN 9FT ADLT (ELECTROSURGICAL) ×2
ELECTRODE REM PT RTRN 9FT ADLT (ELECTROSURGICAL) ×1 IMPLANT
GAUZE PACKING 1/4 X5 YD (GAUZE/BANDAGES/DRESSINGS) ×2 IMPLANT
GAUZE PACKING IODOFORM 1X5 (MISCELLANEOUS) ×2 IMPLANT
GAUZE SPONGE 4X4 12PLY STRL (GAUZE/BANDAGES/DRESSINGS) ×2 IMPLANT
GAUZE XEROFORM 1X8 LF (GAUZE/BANDAGES/DRESSINGS) ×2 IMPLANT
GLOVE BIO SURGEON STRL SZ7.5 (GLOVE) ×2 IMPLANT
GLOVE INDICATOR 8.0 STRL GRN (GLOVE) ×2 IMPLANT
GOWN STRL REUS W/ TWL LRG LVL3 (GOWN DISPOSABLE) ×2 IMPLANT
GOWN STRL REUS W/TWL LRG LVL3 (GOWN DISPOSABLE) ×2
GOWN STRL REUS W/TWL MED LVL3 (GOWN DISPOSABLE) ×4 IMPLANT
HANDPIECE VERSAJET DEBRIDEMENT (MISCELLANEOUS) ×1 IMPLANT
IV NS 1000ML (IV SOLUTION) ×1
IV NS 1000ML BAXH (IV SOLUTION) ×1 IMPLANT
KIT TURNOVER KIT A (KITS) ×2 IMPLANT
LABEL OR SOLS (LABEL) ×2 IMPLANT
NDL FILTER BLUNT 18X1 1/2 (NEEDLE) ×1 IMPLANT
NDL HYPO 25X1 1.5 SAFETY (NEEDLE) ×1 IMPLANT
NEEDLE FILTER BLUNT 18X 1/2SAF (NEEDLE) ×1
NEEDLE FILTER BLUNT 18X1 1/2 (NEEDLE) ×1 IMPLANT
NEEDLE HYPO 25X1 1.5 SAFETY (NEEDLE) ×2 IMPLANT
NS IRRIG 500ML POUR BTL (IV SOLUTION) ×2 IMPLANT
PACK EXTREMITY ARMC (MISCELLANEOUS) ×2 IMPLANT
PAD ABD DERMACEA PRESS 5X9 (GAUZE/BANDAGES/DRESSINGS) ×2 IMPLANT
PULSAVAC PLUS IRRIG FAN TIP (DISPOSABLE) ×2
RASP SM TEAR CROSS CUT (RASP) IMPLANT
SHIELD FULL FACE ANTIFOG 7M (MISCELLANEOUS) ×2 IMPLANT
SOL .9 NS 3000ML IRR  AL (IV SOLUTION) ×1
SOL .9 NS 3000ML IRR UROMATIC (IV SOLUTION) ×1 IMPLANT
SOL PREP PVP 2OZ (MISCELLANEOUS) ×2
SOLUTION PREP PVP 2OZ (MISCELLANEOUS) ×1 IMPLANT
STOCKINETTE IMPERVIOUS 9X36 MD (GAUZE/BANDAGES/DRESSINGS) ×2 IMPLANT
SUT ETHILON 2 0 FS 18 (SUTURE) ×4 IMPLANT
SUT ETHILON 4-0 (SUTURE) ×1
SUT ETHILON 4-0 FS2 18XMFL BLK (SUTURE) ×1
SUT VIC AB 3-0 SH 27 (SUTURE) ×1
SUT VIC AB 3-0 SH 27X BRD (SUTURE) ×1 IMPLANT
SUT VIC AB 4-0 FS2 27 (SUTURE) ×2 IMPLANT
SUTURE ETHLN 4-0 FS2 18XMF BLK (SUTURE) ×1 IMPLANT
SWAB CULTURE AMIES ANAERIB BLU (MISCELLANEOUS) IMPLANT
SYR 10ML LL (SYRINGE) ×4 IMPLANT
SYR 3ML LL SCALE MARK (SYRINGE) ×2 IMPLANT
TIP FAN IRRIG PULSAVAC PLUS (DISPOSABLE) ×1 IMPLANT

## 2019-03-02 NOTE — Anesthesia Procedure Notes (Signed)
Date/Time: 03/02/2019 7:38 AM Performed by: Johnna Acosta, CRNA Pre-anesthesia Checklist: Patient identified, Emergency Drugs available, Suction available, Patient being monitored and Timeout performed Patient Re-evaluated:Patient Re-evaluated prior to induction Oxygen Delivery Method: Simple face mask Preoxygenation: Pre-oxygenation with 100% oxygen Induction Type: IV induction

## 2019-03-02 NOTE — Op Note (Addendum)
Operative note   Surgeon:Yahsir Wickens Lawyer: None    Preop diagnosis: Osteomyelitis left second metatarsal    Postop diagnosis: Same    Procedure:  1. Excision distal second metatarsal.  2.  Intraoperative fluoroscopy    EBL: Minimal    Anesthesia:local and IV sedation.  Local consisted of a one-to-one mixture of 0.5% bupivacaine plain and 1% lidocaine with epinephrine a total of 10 cc was used    Hemostasis: Epinephrine infiltrated along the incision site    Specimen: Deep wound culture and bone for culture, bone for pathology second metatarsal    Complications: None    Operative indications:Joann [name] is an 73 y.o. that presents today for surgical intervention.  The risks/benefits/alternatives/complications have been discussed and consent has been given.    Procedure:  Patient was brought into the OR and placed on the operating table in thesupine position. After anesthesia was obtained theleft lower extremity was prepped and draped in usual sterile fashion.  Attention was directed to the second metatarsophalangeal joint region where a previous second toe amputation had been performed with noted dehiscence down to the metatarsal.  The incision was taken proximal.  Sharp and blunt dissection carried down to the second metatarsal.  At this point the wound space was evaluated and the cartilaginous cap of the second metatarsal head fragmented away.  This was excised and sent for culture of the bone and cartilage.  This was sent fresh in sterile saline.  A deep wound culture was also performed with a swab.  At this time the second metatarsal was excised at the level of the surgical neck distal one third.  A second fragment was also removed proximal to this an inked as the proximal margin.  The wound was then irrigated and debrided with the versa jet down to the bone and capsular material.  Good excision of all necrotic and nonviable tissue was noted at this time.  The wound was  flushed with copious amounts of irrigation as well.  All bleeders were Bovie cauterized.  At this time closure was then performed with a 3-0 and 4-0 nylon.  The proximal aspect was packed with iodoform.  A bulky sterile dressing was applied.  Intraoperative fluoroscopy was used both prior to excision of the metatarsal and evaluation of the osteomyelitis and after excision of the metatarsal.    Patient tolerated the procedure and anesthesia well.  Was transported from the OR to the PACU with all vital signs stable and vascular status intact. To be discharged per routine protocol.  Will follow up in approximately 1 week in the outpatient clinic.

## 2019-03-02 NOTE — Anesthesia Postprocedure Evaluation (Signed)
Anesthesia Post Note  Patient: Joann [name]  Procedure(s) Performed: IRRIGATION AND DEBRIDEMENT LEFT FOOT SECOND METATARSAL, DIABETIC (Left Foot)  Patient location during evaluation: PACU Anesthesia Type: General Level of consciousness: awake and alert Pain management: pain level controlled Vital Signs Assessment: post-procedure vital signs reviewed and stable Respiratory status: spontaneous breathing and respiratory function stable Cardiovascular status: stable Anesthetic complications: no     Last Vitals:  Vitals:   03/02/19 0015 03/02/19 0829  BP: (!) 151/65 102/74  Pulse: 85   Resp:  (!) 23  Temp: 36.9 C 37.7 C  SpO2: 95% 99%    Last Pain:  Vitals:   03/02/19 0015  TempSrc: Oral  PainSc:                  KEPHART,WILLIAM K

## 2019-03-02 NOTE — Transfer of Care (Signed)
Immediate Anesthesia Transfer of Care Note  Patient: Joann [name]  Procedure(s) Performed: IRRIGATION AND DEBRIDEMENT LEFT FOOT SECOND METATARSAL, DIABETIC (Left Foot)  Patient Location: PACU  Anesthesia Type:General  Level of Consciousness: awake, alert  and oriented  Airway & Oxygen Therapy: Patient Spontanous Breathing and Patient connected to face mask oxygen  Post-op Assessment: Report given to RN and Post -op Vital signs reviewed and stable  Post vital signs: Reviewed and stable  Last Vitals:  Vitals Value Taken Time  BP 102/74 03/02/19 0829  Temp 37.7 C 03/02/19 0829  Pulse    Resp 23 03/02/19 0829  SpO2 99 % 03/02/19 0829    Last Pain:  Vitals:   03/02/19 0015  TempSrc: Oral  PainSc:          Complications: No apparent anesthesia complications

## 2019-03-02 NOTE — Progress Notes (Signed)
[place] at [place] NAME: Yanissa Michalsky    MR#:  [id number]  DATE OF BIRTH:  06-15-1946  SUBJECTIVE:  CHIEF COMPLAINT:  No chief complaint on file.  No new complaint this morning.  Patient just came back from the OR.  Still very sleepy.  Husband at bedside.  No fevers. REVIEW OF SYSTEMS:  ROS unobtainable due to patient still being very sleepy following surgery.  DRUG ALLERGIES:   Allergies  Allergen Reactions  . Neosporin [Neomycin-Bacitracin Zn-Polymyx]     "it just don't agree with me"   . Penicillins Itching    Has patient had a PCN reaction causing immediate rash, facial/tongue/throat swelling, SOB or lightheadedness with hypotension: no Has patient had a PCN reaction causing severe rash involving mucus membranes or skin necrosis: no Has patient had a PCN reaction that required hospitalization no Has patient had a PCN reaction occurring within the last 10 years:no If all of the above answers are "NO", then may proceed with Cephalosporin use.   . Statins     hallucinations   VITALS:  Blood pressure (!) 118/57, pulse 73, temperature 98.5 F (36.9 C), temperature source Oral, resp. rate (!) 23, height 5\' 2"  (1.575 m), weight 86.5 kg, SpO2 96 %. PHYSICAL EXAMINATION:  Physical Exam   GENERAL:  73 y.o.-year-old patient lying in the bed with no acute distress.  Resting comfortably EYES: Pupils equal, round, reactive to light and accommodation. No scleral icterus.  HEENT: Head atraumatic, normocephalic. Oropharynx and nasopharynx clear.  NECK:  Supple, no jugular venous distention. No thyroid enlargement, no tenderness.  LUNGS: Normal breath sounds bilaterally, no wheezing, rales,rhonchi or crepitation. No use of accessory muscles of respiration.  CARDIOVASCULAR: S1, S2 normal. No murmurs, rubs, or gallops.  ABDOMEN: Soft, nontender, nondistended. Bowel sounds present. No organomegaly or mass.  EXTREMITIES: Dressing in place to  left foot status post surgery  NEUROLOGIC: Cranial nerves II through XII are intact. Muscle strength 5/5 in all extremities. Sensation intact. Gait not checked.  Left foot second toe is amputated and dressing is in place. PSYCHIATRIC: The patient is alert and oriented x 3.  SKIN: No obvious rash, lesion, or ulcer.  LABORATORY PANEL:  Female CBC Recent Labs  Lab 03/02/19 0345  WBC 13.9*  HGB 12.0  HCT 37.5  PLT 333   ------------------------------------------------------------------------------------------------------------------ Chemistries  Recent Labs  Lab 02/25/19 0705 03/01/19 1621 03/02/19 0345  NA 137 139 138  K 3.1* 3.5 3.1*  CL 104 106 107  CO2 25 22 23   GLUCOSE 141* 110* 218*  BUN 17 22 18   CREATININE 1.00 1.05* 0.94  CALCIUM 8.8* 9.7 8.8*  MG 2.0  --   --   AST  --  49*  --   ALT  --  30  --   ALKPHOS  --  136*  --   BILITOT  --  0.4  --    RADIOLOGY:  No results found. ASSESSMENT AND PLAN:   1. 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  Patient currently resting comfortably. Currently on broad-spectrum IV antibiotics with cefepime and vancomycin pending results of cultures.  2.Diabetes mellitus type II Hold oral metformin and keep on sliding scale coverage Monitor and adjust regimen  3.Hypertension Blood pressure fairly controlled on current regimen.  Monitor  4.  Hypokalemia Replaced.  Follow-up  on repeat levels in a.m.  DVT prophylaxis; heparin  All the records are reviewed and case discussed with Care Management/Social Worker. Management plans discussed with the patient, family and they are in agreement. Updated husband present at bedside on treatment plans.  CODE STATUS: DNR  TOTAL TIME TAKING CARE OF THIS PATIENT: 35 minutes.   More than 50% of the time was spent in counseling/coordination of  care: YES  POSSIBLE D/C IN 2 DAYS, DEPENDING ON CLINICAL CONDITION.   Lenus Trauger M.D on 03/02/2019 at 11:52 AM  Between 7am to 6pm - Pager - [phone]  After 6pm go to [email or web address] - Proofreader  [facility]  Office  [phone]  CC: Primary care physician; Baxter Hire, MD  Note: This dictation was prepared with Dragon dictation along with smaller phrase technology. Any transcriptional errors that result from this process are unintentional.

## 2019-03-02 NOTE — H&P (Signed)
HISTORY AND PHYSICAL INTERVAL NOTE:  03/02/2019  7:24 AM  Joann [name]  has presented today for surgery, with the diagnosis of L02.612 ABCESS OF LEFT FOOT M86.9 OSTEOMYELITIS OF SECOND TOE OF LEFT FOOT.  The various methods of treatment have been discussed with the patient.  No guarantees were given.  After consideration of risks, benefits and other options for treatment, the patient has consented to surgery.  I have reviewed the patients' chart and labs.     [name] history and physical examination was performed in my office.  The patient was reexamined.  There have been no changes to this history and physical examination.  Joann [name]

## 2019-03-02 NOTE — TOC Progression Note (Signed)
Transition of Care [facility]) - Progression Note    Patient Details  Name: Joann [name] MRN: [id number] Date of Birth: Nov 26, 1945  Transition of Care [facility]) CM/SW Contact  Jamarr Treinen, Lenice Llamas Phone Number: [phone]  03/02/2019, 8:51 AM  Clinical Narrative: Clinical Social Worker (CSW) confirmed with [facility] representative that patient is open to home health services with [facility]. CSW will continue to follow and assist as needed.           Expected Discharge Plan and Services                                                 Social Determinants of Health (SDOH) Interventions    Readmission Risk Interventions No flowsheet data found.

## 2019-03-02 NOTE — Anesthesia Post-op Follow-up Note (Signed)
Anesthesia QCDR form completed.        

## 2019-03-02 NOTE — Progress Notes (Signed)
   03/02/19 1500  Clinical Encounter Type  Visited With Patient and family together  Visit Type Initial;Spiritual support  Referral From Nurse  Consult/Referral To Chaplain  Spiritual Encounters  Spiritual Needs Prayer;Emotional  Chaplain received OR for prayer and visited the patient. Patient husband was at bedside. Chaplain introduced herself and patient and husband shared their life of 34 years of marriage together. Their faith in God is strong. Patient is hopeful that her toe will heal correctly. Patient and husband believe God will take care of this. Chaplain prayed with patient and husband and gave encouraging words of comfort of healing

## 2019-03-03 ENCOUNTER — Encounter: Payer: Self-pay | Admitting: Podiatry

## 2019-03-03 LAB — CBC
HCT: 33.7 % — ABNORMAL LOW (ref 36.0–46.0)
Hemoglobin: 10.7 g/dL — ABNORMAL LOW (ref 12.0–15.0)
MCH: 29.1 pg (ref 26.0–34.0)
MCHC: 31.8 g/dL (ref 30.0–36.0)
MCV: 91.6 fL (ref 80.0–100.0)
Platelets: 250 10*3/uL (ref 150–400)
RBC: 3.68 MIL/uL — ABNORMAL LOW (ref 3.87–5.11)
RDW: 13.6 % (ref 11.5–15.5)
WBC: 9 10*3/uL (ref 4.0–10.5)
nRBC: 0 % (ref 0.0–0.2)

## 2019-03-03 LAB — BASIC METABOLIC PANEL
Anion gap: 5 (ref 5–15)
BUN: 16 mg/dL (ref 8–23)
CO2: 25 mmol/L (ref 22–32)
Calcium: 8.1 mg/dL — ABNORMAL LOW (ref 8.9–10.3)
Chloride: 106 mmol/L (ref 98–111)
Creatinine, Ser: 0.86 mg/dL (ref 0.44–1.00)
GFR calc Af Amer: >60 mL/min (ref >60)
GFR calc non Af Amer: >60 mL/min (ref >60)
Glucose, Bld: 132 mg/dL — ABNORMAL HIGH (ref 70–99)
Potassium: 3.7 mmol/L (ref 3.5–5.1)
Sodium: 136 mmol/L (ref 135–145)

## 2019-03-03 LAB — GLUCOSE, CAPILLARY
Glucose-Capillary: 120 mg/dL — ABNORMAL HIGH (ref 70–99)
Glucose-Capillary: 164 mg/dL — ABNORMAL HIGH (ref 70–99)
Glucose-Capillary: 91 mg/dL (ref 70–99)
Glucose-Capillary: 158 mg/dL — ABNORMAL HIGH (ref 70–99)
Glucose-Capillary: 100 mg/dL — ABNORMAL HIGH (ref 70–99)

## 2019-03-03 LAB — AEROBIC/ANAEROBIC CULTURE W GRAM STAIN (SURGICAL/DEEP WOUND)

## 2019-03-03 LAB — AEROBIC/ANAEROBIC CULTURE (SURGICAL/DEEP WOUND)

## 2019-03-03 LAB — MAGNESIUM: Magnesium: 1.7 mg/dL (ref 1.7–2.4)

## 2019-03-03 MED ORDER — MAGNESIUM SULFATE 2 GM/50ML IV SOLN
2.0000 g | Freq: Once | INTRAVENOUS | Status: AC
  Administered 2019-03-03: 2 g via INTRAVENOUS
  Filled 2019-03-03: qty 50

## 2019-03-03 NOTE — Progress Notes (Signed)
[place] at [place] NAME: Joann [name]    MR#:  [id number]  DATE OF BIRTH:  04/13/1946  SUBJECTIVE:  CHIEF COMPLAINT:  No chief complaint on file.  The patient has no complaints. REVIEW OF SYSTEMS:  Review of Systems  Constitutional: Negative for chills, fever and malaise/fatigue.  HENT: Negative for sore throat.   Eyes: Negative for blurred vision and double vision.  Respiratory: Negative for cough, hemoptysis, shortness of breath, wheezing and stridor.   Cardiovascular: Negative for chest pain, palpitations, orthopnea and leg swelling.  Gastrointestinal: Negative for abdominal pain, blood in stool, diarrhea, melena, nausea and vomiting.  Genitourinary: Negative for dysuria, flank pain and hematuria.  Musculoskeletal: Negative for back pain and joint pain.  Neurological: Negative for dizziness, sensory change, focal weakness, seizures, loss of consciousness, weakness and headaches.  Endo/Heme/Allergies: Negative for polydipsia.  Psychiatric/Behavioral: Negative for depression. The patient is not nervous/anxious.     DRUG ALLERGIES:   Allergies  Allergen Reactions  . Neosporin [Neomycin-Bacitracin Zn-Polymyx]     "it just don't agree with me"   . Penicillins Itching    Has patient had a PCN reaction causing immediate rash, facial/tongue/throat swelling, SOB or lightheadedness with hypotension: no Has patient had a PCN reaction causing severe rash involving mucus membranes or skin necrosis: no Has patient had a PCN reaction that required hospitalization no Has patient had a PCN reaction occurring within the last 10 years:no If all of the above answers are "NO", then may proceed with Cephalosporin use.   . Statins     hallucinations   VITALS:  Blood pressure 120/60, pulse 69, temperature 97.8 F (36.6 C), resp. rate 16, height 5\' 2"  (1.575 m), weight 86.5 kg, SpO2 96 %. PHYSICAL EXAMINATION:  Physical Exam   GENERAL:  73  y.o.-year-old patient lying in the bed with no acute distress.  Resting comfortably EYES: Pupils equal, round, reactive to light and accommodation. No scleral icterus.  HEENT: Head atraumatic, normocephalic. Oropharynx and nasopharynx clear.  NECK:  Supple, no jugular venous distention. No thyroid enlargement, no tenderness.  LUNGS: Normal breath sounds bilaterally, no wheezing, rales,rhonchi or crepitation. No use of accessory muscles of respiration.  CARDIOVASCULAR: S1, S2 normal. No murmurs, rubs, or gallops.  ABDOMEN: Soft, nontender, nondistended. Bowel sounds present. No organomegaly or mass.  EXTREMITIES: Dressing in place to left foot status post surgery  NEUROLOGIC: Cranial nerves II through XII are intact. Muscle strength 5/5 in all extremities. Sensation intact. Gait not checked.  PSYCHIATRIC: The patient is alert and oriented x 3.  SKIN: No obvious rash, lesion, or ulcer.  LABORATORY PANEL:  Female CBC Recent Labs  Lab 03/03/19 0420  WBC 9.0  HGB 10.7*  HCT 33.7*  PLT 250   ------------------------------------------------------------------------------------------------------------------ Chemistries  Recent Labs  Lab 03/01/19 1621  03/03/19 0420  NA 139   < > 136  K 3.5   < > 3.7  CL 106   < > 106  CO2 22   < > 25  GLUCOSE 110*   < > 132*  BUN 22   < > 16  CREATININE 1.05*   < > 0.86  CALCIUM 9.7   < > 8.1*  MG  --   --  1.7  AST 49*  --   --   ALT 30  --   --   ALKPHOS 136*  --   --   BILITOT 0.4  --   --    < > =  values in this interval not displayed.   RADIOLOGY:  No results found. ASSESSMENT AND PLAN:   1. 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  Patient currently resting comfortably. She was on broad-spectrum IV antibiotics with cefepime and vancomycin pending results of cultures.  Change to Ancef IV per  Dr. Ramon Dredge  2.Diabetes mellitus type II Hold oral metformin and keep on sliding scale coverage Blood sugar is controlled.  3.Hypertension Blood pressure fairly controlled on current regimen.  4.  Hypokalemia Improved with potassium supplement. Hypomagnesemia.  Magnesium supplement.  DVT prophylaxis; heparin  All the records are reviewed and case discussed with Care Management/Social Worker. Management plans discussed with the patient, her husband and they are in agreement.  CODE STATUS: DNR  TOTAL TIME TAKING CARE OF THIS PATIENT: 28 minutes.   More than 50% of the time was spent in counseling/coordination of care: YES  POSSIBLE D/C IN 2 DAYS, DEPENDING ON CLINICAL CONDITION.   Demetrios Loll M.D on 03/03/2019 at 2:12 PM  Between 7am to 6pm - Pager - [phone]  After 6pm go to [email or web address] - Proofreader  [facility]  Office  [phone]  CC: Primary care physician; Baxter Hire, MD  Note: This dictation was prepared with Dragon dictation along with smaller phrase technology. Any transcriptional errors that result from this process are unintentional.

## 2019-03-03 NOTE — Progress Notes (Signed)
1 Day Post-Op   Subjective/Chief Complaint: Patient seen.  No complaints of pain.   Objective: Vital signs in last 24 hours: Temp:  [97.8 F (36.6 C)-98.6 F (37 C)] 97.8 F (36.6 C) (08/08 0742) Pulse Rate:  [69-81] 69 (08/08 0742) Resp:  [16-18] 16 (08/08 0742) BP: (119-120)/(56-60) 120/60 (08/08 0742) SpO2:  [96 %-99 %] 96 % (08/08 0742) Last BM Date: 03/01/19  Intake/Output from previous day: 08/07 0701 - 08/08 0700 In: 500 [P.O.:50; I.V.:350; IV Piggyback:100] Out: 0  Intake/Output this shift: Total I/O In: 240 [P.O.:240] Out: -   The bandage is dry and intact.  Some dried blood on the bandaging.  Upon removal the incision is well coapted with no significant erythema and only mild edema.  No significant signs of purulence.  White count has significantly improved.  Lab Results:  Recent Labs    03/02/19 0345 03/03/19 0420  WBC 13.9* 9.0  HGB 12.0 10.7*  HCT 37.5 33.7*  PLT 333 250   BMET Recent Labs    03/02/19 0345 03/03/19 0420  NA 138 136  K 3.1* 3.7  CL 107 106  CO2 23 25  GLUCOSE 218* 132*  BUN 18 16  CREATININE 0.94 0.86  CALCIUM 8.8* 8.1*   PT/INR No results for input(s): LABPROT, INR in the last 72 hours. ABG No results for input(s): PHART, HCO3 in the last 72 hours.  Invalid input(s): PCO2, PO2  Studies/Results: No results found.  Anti-infectives: Anti-infectives (From admission, onward)   Start     Dose/Rate Route Frequency Ordered Stop   03/02/19 2200  ceFAZolin (ANCEF) IVPB 2g/100 mL premix     2 g 200 mL/hr over 30 Minutes Intravenous Every 8 hours 03/02/19 1509     03/02/19 2000  vancomycin (VANCOCIN) IVPB 750 mg/150 ml premix  Status:  Discontinued     750 mg 150 mL/hr over 60 Minutes Intravenous Every 24 hours 03/01/19 1705 03/02/19 1509   03/01/19 2000  vancomycin (VANCOCIN) 2,000 mg in sodium chloride 0.9 % 500 mL IVPB     2,000 mg 250 mL/hr over 120 Minutes Intravenous  Once 03/01/19 1705 03/01/19 2259   03/01/19 1800   ceFEPIme (MAXIPIME) 2 g in sodium chloride 0.9 % 100 mL IVPB  Status:  Discontinued     2 g 200 mL/hr over 30 Minutes Intravenous Every 12 hours 03/01/19 1705 03/02/19 1509      Assessment/Plan: s/p Procedure(s): IRRIGATION AND DEBRIDEMENT LEFT FOOT SECOND METATARSAL, DIABETIC (Left) Assessment: Stable status post debridement bone left foot.   Plan: Iodoform gauze packed back into the open area of the incision followed by a bulky sterile bandage.  Plan for bandage change on Monday and patient will be discharged with home health care for packing of the wound.  Most likely will need IV antibiotics.  Patient will follow-up with Dr. Vickki Muff the week after her discharge.  LOS: 2 days    Durward Fortes 03/03/2019

## 2019-03-04 LAB — GLUCOSE, CAPILLARY
Glucose-Capillary: 128 mg/dL — ABNORMAL HIGH (ref 70–99)
Glucose-Capillary: 138 mg/dL — ABNORMAL HIGH (ref 70–99)
Glucose-Capillary: 127 mg/dL — ABNORMAL HIGH (ref 70–99)
Glucose-Capillary: 96 mg/dL (ref 70–99)

## 2019-03-04 MED ORDER — BISACODYL 5 MG PO TBEC
5.0000 mg | DELAYED_RELEASE_TABLET | Freq: Every day | ORAL | Status: DC | PRN
  Administered 2019-03-04 – 2019-03-05 (×2): 5 mg via ORAL
  Filled 2019-03-04 (×2): qty 1

## 2019-03-04 MED ORDER — SENNA 8.6 MG PO TABS
1.0000 | ORAL_TABLET | Freq: Every day | ORAL | Status: DC | PRN
  Administered 2019-03-04 – 2019-03-05 (×2): 8.6 mg via ORAL
  Filled 2019-03-04 (×2): qty 1

## 2019-03-04 NOTE — Progress Notes (Signed)
ID Excision distal second metatarsal on 03/02/19 Had 2nd toe amputation on 02/23/19 Current surgical cultures pending- previous one had MSSA, sterp Currently on cefazoin Will evaluate the wound tomorrow with Dr.cline and decide on PICC/IV

## 2019-03-04 NOTE — Progress Notes (Signed)
[place] at [place] NAME: Joann [name]    MR#:  [id number]  DATE OF BIRTH:  09/21/1945  SUBJECTIVE:  CHIEF COMPLAINT:  No chief complaint on file.  The patient has no complaints. REVIEW OF SYSTEMS:  Review of Systems  Constitutional: Negative for chills, fever and malaise/fatigue.  HENT: Negative for sore throat.   Eyes: Negative for blurred vision and double vision.  Respiratory: Negative for cough, hemoptysis, shortness of breath, wheezing and stridor.   Cardiovascular: Negative for chest pain, palpitations, orthopnea and leg swelling.  Gastrointestinal: Negative for abdominal pain, blood in stool, diarrhea, melena, nausea and vomiting.  Genitourinary: Negative for dysuria, flank pain and hematuria.  Musculoskeletal: Negative for back pain and joint pain.  Neurological: Negative for dizziness, sensory change, focal weakness, seizures, loss of consciousness, weakness and headaches.  Endo/Heme/Allergies: Negative for polydipsia.  Psychiatric/Behavioral: Negative for depression. The patient is not nervous/anxious.     DRUG ALLERGIES:   Allergies  Allergen Reactions  . Neosporin [Neomycin-Bacitracin Zn-Polymyx]     "it just don't agree with me"   . Penicillins Itching    Has patient had a PCN reaction causing immediate rash, facial/tongue/throat swelling, SOB or lightheadedness with hypotension: no Has patient had a PCN reaction causing severe rash involving mucus membranes or skin necrosis: no Has patient had a PCN reaction that required hospitalization no Has patient had a PCN reaction occurring within the last 10 years:no If all of the above answers are "NO", then may proceed with Cephalosporin use.   . Statins     hallucinations   VITALS:  Blood pressure (!) 127/58, pulse (!) 56, temperature 98 F (36.7 C), resp. rate 16, height 5\' 2"  (1.575 m), weight 86.5 kg, SpO2 98 %. PHYSICAL EXAMINATION:  Physical Exam   GENERAL:   73 y.o.-year-old patient lying in the bed with no acute distress.  Resting comfortably EYES: Pupils equal, round, reactive to light and accommodation. No scleral icterus.  HEENT: Head atraumatic, normocephalic. Oropharynx and nasopharynx clear.  NECK:  Supple, no jugular venous distention. No thyroid enlargement, no tenderness.  LUNGS: Normal breath sounds bilaterally, no wheezing, rales,rhonchi or crepitation. No use of accessory muscles of respiration.  CARDIOVASCULAR: S1, S2 normal. No murmurs, rubs, or gallops.  ABDOMEN: Soft, nontender, nondistended. Bowel sounds present. No organomegaly or mass.  EXTREMITIES: Dressing in place to left foot status post surgery  NEUROLOGIC: Cranial nerves II through XII are intact. Muscle strength 5/5 in all extremities. Sensation intact. Gait not checked.  PSYCHIATRIC: The patient is alert and oriented x 3.  SKIN: No obvious rash, lesion, or ulcer.  LABORATORY PANEL:  Female CBC Recent Labs  Lab 03/03/19 0420  WBC 9.0  HGB 10.7*  HCT 33.7*  PLT 250   ------------------------------------------------------------------------------------------------------------------ Chemistries  Recent Labs  Lab 03/01/19 1621  03/03/19 0420  NA 139   < > 136  K 3.5   < > 3.7  CL 106   < > 106  CO2 22   < > 25  GLUCOSE 110*   < > 132*  BUN 22   < > 16  CREATININE 1.05*   < > 0.86  CALCIUM 9.7   < > 8.1*  MG  --   --  1.7  AST 49*  --   --   ALT 30  --   --   ALKPHOS 136*  --   --   BILITOT 0.4  --   --    < > =  values in this interval not displayed.   RADIOLOGY:  No results found. ASSESSMENT AND PLAN:   1. 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  Patient currently resting comfortably. She was on broad-spectrum IV antibiotics with cefepime and vancomycin pending results of cultures.  Changed to Ancef IV  per Dr. Ramon Dredge  2.Diabetes mellitus type II Hold oral metformin and keep on sliding scale coverage Blood sugar is controlled.  3.Hypertension Blood pressure fairly controlled on current regimen.  4.  Hypokalemia Improved with potassium supplement. Hypomagnesemia.  Magnesium supplement.  DVT prophylaxis; heparin  All the records are reviewed and case discussed with Care Management/Social Worker. Management plans discussed with the patient, her husband and they are in agreement.  CODE STATUS: DNR  TOTAL TIME TAKING CARE OF THIS PATIENT: 25 minutes.   More than 50% of the time was spent in counseling/coordination of care: YES  POSSIBLE D/C IN 2 DAYS, DEPENDING ON CLINICAL CONDITION.   Demetrios Loll M.D on 03/04/2019 at 1:06 PM  Between 7am to 6pm - Pager - [phone]  After 6pm go to [email or web address] - Proofreader  [facility]  Office  [phone]  CC: Primary care physician; Baxter Hire, MD  Note: This dictation was prepared with Dragon dictation along with smaller phrase technology. Any transcriptional errors that result from this process are unintentional.

## 2019-03-04 NOTE — Evaluation (Signed)
Physical Therapy Evaluation Patient Details Name: Joann [name] MRN: [id number] DOB: 06-13-1946 Today's Date: 03/04/2019   History of Present Illness  73 y.o. female who was here 02/21/2019 due to left foot infection. 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 Relevant PMH includes breast cancer, HTN, DM.  Clinical Impression  Pt showed great effort and confidence with ambulation and stair negotiation and displayed no safety concerns.  She will have available assist from husband 24/7 and was able to maintain appropriate heel-only WBing with post-op shoe. Pt did well and is safe to go home w/o further PT intervention, will complete PT orders at this time.    Follow Up Recommendations No PT follow up    Equipment Recommendations  None recommended by PT    Recommendations for Other Services       Precautions / Restrictions Precautions Precautions: Fall Restrictions Weight Bearing Restrictions: Yes LLE Weight Bearing: Weight bearing as tolerated(through heel with post-op boot donned) Other Position/Activity Restrictions: WB to heel with steps L post-op shoe at all times, clarified with poliatry again this admit      Mobility  Bed Mobility Overal bed mobility: Independent             General bed mobility comments: supine to sit  Transfers Overall transfer level: Modified independent Equipment used: Rolling walker (2 wheeled) Transfers: Sit to/from Stand Sit to Stand: Supervision         General transfer comment: Patient required cuing for proper hand placement and use of RW  Ambulation/Gait Ambulation/Gait assistance: Supervision Gait Distance (Feet): 250 Feet Assistive device: Rolling walker (2 wheeled)       General Gait Details: Pt was able to ambulate with consistent and confidence cadence, good  overall safety and no fatigue or hesitation.  Stairs Stairs: Yes Stairs assistance: Supervision Stair Management: One rail Right Number of Stairs: 4 General stair comments: Pt able to negotiate up/down steps safely and with good awareness of heel WBing only  Wheelchair Mobility    Modified Rankin (Stroke Patients Only)       Balance Overall balance assessment: Independent   Sitting balance-Leahy Scale: Normal       Standing balance-Leahy Scale: Good Standing balance comment: Pt not overly reliant on the walker, showed good tolerance and confidence                             Pertinent Vitals/Pain Pain Assessment: No/denies pain    Home Living Family/patient expects to be discharged to:: Private residence Living Arrangements: Spouse/significant other Available Help at Discharge: Family Type of Home: House Home Access: Stairs to enter Entrance Stairs-Rails: Right Entrance Stairs-Number of Steps: 2 Home Layout: One level Home Equipment: Environmental consultant - 2 wheels      Prior Function Level of Independence: Independent         Comments: Pateint reports she is I with all aspects of care and mobility including driving     Hand Dominance        Extremity/Trunk Assessment   Upper Extremity Assessment Upper Extremity Assessment: Overall WFL for tasks assessed    Lower Extremity Assessment Lower Extremity Assessment: Overall WFL for tasks assessed RLE Deficits / Details: foot bandaged and only able to weight bear with post-op shoe donned.  Leg strength is [facility]       Communication   Communication: No difficulties  Cognition Arousal/Alertness: Awake/alert Behavior During Therapy: WFL for tasks assessed/performed Overall Cognitive Status: Within Functional Limits for tasks assessed                                        General Comments      Exercises     Assessment/Plan    PT Assessment Patent does not need any further PT services   PT Problem List Decreased safety awareness;Decreased knowledge of use of DME       PT Treatment Interventions      PT Goals (Current goals can be found in the Care Plan section)  Acute Rehab PT Goals Patient Stated Goal: return home PT Goal Formulation: All assessment and education complete, DC therapy    Frequency     Barriers to discharge        Co-evaluation               AM-PAC PT "6 Clicks" Mobility  Outcome Measure Help needed turning from your back to your side while in a flat bed without using bedrails?: None Help needed moving from lying on your back to sitting on the side of a flat bed without using bedrails?: None Help needed moving to and from a bed to a chair (including a wheelchair)?: None Help needed standing up from a chair using your arms (e.g., wheelchair or bedside chair)?: None Help needed to walk in hospital room?: None Help needed climbing 3-5 steps with a railing? : None 6 Click Score: 24    End of Session Equipment Utilized During Treatment: Gait belt(post-op shoe) Activity Tolerance: Patient tolerated treatment well Patient left: (on commode, nursing aware) Nurse Communication: Mobility status PT Visit Diagnosis: Unsteadiness on feet (R26.81);Difficulty in walking, not elsewhere classified (R26.2)    Time: 4562-5638 PT Time Calculation (min) (ACUTE ONLY): 18 min   Charges:   PT Evaluation $PT Eval Low Complexity: 1 Low          Kreg Shropshire, DPT 03/04/2019, 5:04 PM

## 2019-03-05 ENCOUNTER — Inpatient Hospital Stay: Payer: Self-pay

## 2019-03-05 DIAGNOSIS — E1169 Type 2 diabetes mellitus with other specified complication: Secondary | ICD-10-CM

## 2019-03-05 DIAGNOSIS — B9561 Methicillin susceptible Staphylococcus aureus infection as the cause of diseases classified elsewhere: Secondary | ICD-10-CM

## 2019-03-05 DIAGNOSIS — B954 Other streptococcus as the cause of diseases classified elsewhere: Secondary | ICD-10-CM

## 2019-03-05 DIAGNOSIS — M869 Osteomyelitis, unspecified: Secondary | ICD-10-CM

## 2019-03-05 LAB — C-REACTIVE PROTEIN: CRP: 1.7 mg/dL — ABNORMAL HIGH

## 2019-03-05 LAB — GLUCOSE, CAPILLARY
Glucose-Capillary: 110 mg/dL — ABNORMAL HIGH (ref 70–99)
Glucose-Capillary: 104 mg/dL — ABNORMAL HIGH (ref 70–99)

## 2019-03-05 LAB — SEDIMENTATION RATE: Sed Rate: 43 mm/h — ABNORMAL HIGH (ref 0–30)

## 2019-03-05 LAB — SURGICAL PATHOLOGY

## 2019-03-05 MED ORDER — SODIUM CHLORIDE 0.9% FLUSH
10.0000 mL | Freq: Two times a day (BID) | INTRAVENOUS | Status: DC
  Administered 2019-03-05: 10 mL

## 2019-03-05 MED ORDER — RISAQUAD PO CAPS
1.0000 | ORAL_CAPSULE | Freq: Every day | ORAL | Status: DC
  Administered 2019-03-05: 1 via ORAL
  Filled 2019-03-05: qty 1

## 2019-03-05 MED ORDER — CEFAZOLIN IV (FOR PTA / DISCHARGE USE ONLY): 2.0000 g | Freq: Three times a day (TID) | INTRAVENOUS | 0 refills | Status: AC

## 2019-03-05 MED ORDER — SODIUM CHLORIDE 0.9% FLUSH: 10.0000 mL | INTRAVENOUS | Status: DC | PRN

## 2019-03-05 MED ORDER — RISAQUAD PO CAPS: 1.0000 | ORAL_CAPSULE | Freq: Every day | ORAL | 0 refills | Status: DC

## 2019-03-05 NOTE — Progress Notes (Signed)
3 Days Post-Op   Subjective/Chief Complaint: Patient seen.  No complaints of pain or any other problems.   Objective: Vital signs in last 24 hours: Temp:  [97.7 F (36.5 C)-98.6 F (37 C)] 97.8 F (36.6 C) (08/10 0731) Pulse Rate:  [53-59] 58 (08/10 0731) Resp:  [16-18] 18 (08/10 0731) BP: (116-145)/(65-80) 145/80 (08/10 0731) SpO2:  [97 %-98 %] 98 % (08/10 0731) Last BM Date: 03/02/19  Intake/Output from previous day: 08/09 0701 - 08/10 0700 In: 884.5 [P.O.:480; IV Piggyback:404.5] Out: 0  Intake/Output this shift: No intake/output data recorded.  Some moderate bleeding is noted on the bandaging.  Upon removal the incision is still well coapted with mild erythema and edema.  Some mild drainage on the packing but no expressible purulence or fluid.  Lab Results:  Recent Labs    03/03/19 0420  WBC 9.0  HGB 10.7*  HCT 33.7*  PLT 250   BMET Recent Labs    03/03/19 0420  NA 136  K 3.7  CL 106  CO2 25  GLUCOSE 132*  BUN 16  CREATININE 0.86  CALCIUM 8.1*   PT/INR No results for input(s): LABPROT, INR in the last 72 hours. ABG No results for input(s): PHART, HCO3 in the last 72 hours.  Invalid input(s): PCO2, PO2  Studies/Results: No results found.  Anti-infectives: Anti-infectives (From admission, onward)   Start     Dose/Rate Route Frequency Ordered Stop   03/02/19 2200  ceFAZolin (ANCEF) IVPB 2g/100 mL premix     2 g 200 mL/hr over 30 Minutes Intravenous Every 8 hours 03/02/19 1509     03/02/19 2000  vancomycin (VANCOCIN) IVPB 750 mg/150 ml premix  Status:  Discontinued     750 mg 150 mL/hr over 60 Minutes Intravenous Every 24 hours 03/01/19 1705 03/02/19 1509   03/01/19 2000  vancomycin (VANCOCIN) 2,000 mg in sodium chloride 0.9 % 500 mL IVPB     2,000 mg 250 mL/hr over 120 Minutes Intravenous  Once 03/01/19 1705 03/01/19 2259   03/01/19 1800  ceFEPIme (MAXIPIME) 2 g in sodium chloride 0.9 % 100 mL IVPB  Status:  Discontinued     2 g 200 mL/hr over  30 Minutes Intravenous Every 12 hours 03/01/19 1705 03/02/19 1509      Assessment/Plan: s/p Procedure(s): IRRIGATION AND DEBRIDEMENT LEFT FOOT SECOND METATARSAL, DIABETIC (Left) Assessment: Stable status post debridement left foot.   Plan: Wound was repacked with iodoform gauze followed by a bulky gauze bandage.  Patient was seen with Dr. Delaine Lame from infectious disease.  Patient should be stable for discharge today on several weeks of IV antibiotics.  Orders already placed for home health care and face-to-face performed.  She will need packing of the wound 3 times a week with iodoform.  Follow-up with Dr. Vickki Muff in 1 to 2 weeks.  LOS: 4 days    Durward Fortes 03/05/2019

## 2019-03-05 NOTE — Progress Notes (Signed)
Patient is alert and oriented and able to verbalize needs. No complaints of pain at this time. VSS. PIV removed. PICC line in place. Discharge instructions gone over wit patient and husband at this time. Prescriptions and printed AVS given to patient in discharge packet. No questions or concerns voiced. Awaiting home health nurse to provide teachign to husband on antibiotic administration at home. All belongings packed.   Bethann Punches, RN

## 2019-03-05 NOTE — Treatment Plan (Signed)
Diagnosis: Left foot infection with osteo Baseline Creatinine -0.86  Culture Result: ( from July - MSSA, strep viridans)  Allergies  Allergen Reactions  . Neosporin [Neomycin-Bacitracin Zn-Polymyx]     "it just don't agree with me"   . Penicillins Itching    Has patient had a PCN reaction causing immediate rash, facial/tongue/throat swelling, SOB or lightheadedness with hypotension: no Has patient had a PCN reaction causing severe rash involving mucus membranes or skin necrosis: no Has patient had a PCN reaction that required hospitalization no Has patient had a PCN reaction occurring within the last 10 years:no If all of the above answers are "NO", then may proceed with Cephalosporin use.   . Statins     hallucinations    OPAT Orders Discharge antibiotics: Cefazolin 2 grams IV every 8 hours until 03/30/19 ( may finish earlier depending on the bone biopsy result)  PIC Care Per Protocol:  Labs weekly while on IV antibiotics: _X_ CBC with differential  _X_ CMP  _X_ ESR   _X_ Please pull PIC at completion of IV antibiotics   Fax weekly labs to [phone]  Clinic Follow Up Appt in 2 weeks:   Call [phone] to make appt   

## 2019-03-05 NOTE — Progress Notes (Signed)
PHARMACY CONSULT NOTE FOR:  OUTPATIENT  PARENTERAL ANTIBIOTIC THERAPY (OPAT)  Indication: Osteomyelitis Regimen: Cefazolin 2gm IV q8h End date:  03/30/2019  IV antibiotic discharge orders are pended. To discharging provider:  please sign these orders via discharge navigator,  Select New Orders & click on the button choice - Manage This Unsigned Work.     Thank you for allowing pharmacy to be [name] part of this patient's care.  Joann [name] 03/05/2019, 8:26 AM

## 2019-03-05 NOTE — Care Management Important Message (Signed)
Important Message  Patient Details  Name: Joann [name] MRN: [id number] Date of Birth: 1945-12-17   Medicare Important Message Given:  Yes     Juliann Pulse A Lela Gell 03/05/2019, 11:12 AM

## 2019-03-05 NOTE — Discharge Instructions (Signed)
HH for IV abx She will need packing of the wound 3 times a week with iodoform.  Follow-up with Dr. Vickki Muff in 1 to 2 weeks.

## 2019-03-05 NOTE — Progress Notes (Signed)
ID Pt doing well No fever Pain left foot better  Patient Vitals for the past 24 hrs:  BP Temp Temp src Pulse Resp SpO2  03/05/19 0731 (!) 145/80 97.8 F (36.6 C) Oral (!) 58 18 98 %  03/04/19 2314 116/75 97.7 F (36.5 C) Oral (!) 59 16 98 %  03/04/19 1601 122/65 98.6 F (37 C) - (!) 53 16 97 %    O/E left foot- dressing removed No swelling, no erythema, 2 nd toe amputation- surgical site looks clean, sutures well co-apted     Impression/Recommendation Left 2nd toe infection with osteo at the resected margin from previous amputation done on 02/23/19. MSSA, Strep viridans in bone culture Underwent resection of distal 2nd metatarsal on 03/02/19. Culture so far negative Will treat like osteo with cefazolin 2grams IV q8 for 4 weeks ( may be able to switch to PO if the pathology from current surgery shows clear margin) Orders placed for home IV antibiotics   DM-on insulin here. Discussed the management with the patient Pt seen with Dr.Cline .

## 2019-03-05 NOTE — Discharge Summary (Signed)
[facility] NAME: Fanny Agan    MR#:  [id number]  DATE OF BIRTH:  Nov 16, 1945  DATE OF ADMISSION:  03/01/2019   ADMITTING PHYSICIAN: Max Sane, MD  DATE OF DISCHARGE: 03/05/2019  PRIMARY CARE PHYSICIAN: Baxter Hire, MD   ADMISSION DIAGNOSIS:  Osteomyelytis DISCHARGE DIAGNOSIS:  Active Problems:   Cellulitis  SECONDARY DIAGNOSIS:   Past Medical History:  Diagnosis Date  . Breast cancer ([place]) 2007   right breast lumpectomy with 36 rad tx  . Breast mass 11/23/2016   6 o'clock  . Diabetes mellitus without complication ([place])   . Hypertension    HOSPITAL COURSE:  1. 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  Patient currently resting comfortably. She was on broad-spectrum IV antibiotics with cefepime and vancomycin pending results of cultures.  Leukocytosis improved. Changed to Ancef IV x 4 weeks, PICC line placement per Dr. Ramon Dredge.Per Dr. Cleda Mccreedy, she will need packing of the wound 3 times a week with iodoform.  Follow-up with Dr. Vickki Muff in 1 to 2 weeks.  2.Diabetes mellitus type II Hold oral metformin and keep on sliding scale coverage Blood sugar is controlled.  Resume metformin after discharge.  3.Hypertension Blood pressure fairly controlled on current regimen.  4.  Hypokalemia Improved with potassium supplement. Hypomagnesemia.    Given magnesium supplement. DISCHARGE CONDITIONS:  Stable, discharge to home with home health today. CONSULTS OBTAINED:   DRUG ALLERGIES:   Allergies  Allergen Reactions  . Neosporin [Neomycin-Bacitracin Zn-Polymyx]     "it just don't agree with me"   . Penicillins Itching    Has patient had a PCN reaction causing immediate rash, facial/tongue/throat swelling, SOB or lightheadedness with hypotension: no Has patient had a  PCN reaction causing severe rash involving mucus membranes or skin necrosis: no Has patient had a PCN reaction that required hospitalization no Has patient had a PCN reaction occurring within the last 10 years:no If all of the above answers are "NO", then may proceed with Cephalosporin use.   . Statins     hallucinations   DISCHARGE MEDICATIONS:   Allergies as of 03/05/2019      Reactions   Neosporin [neomycin-bacitracin Zn-polymyx]    "it just don't agree with me"    Penicillins Itching   Has patient had a PCN reaction causing immediate rash, facial/tongue/throat swelling, SOB or lightheadedness with hypotension: no Has patient had a PCN reaction causing severe rash involving mucus membranes or skin necrosis: no Has patient had a PCN reaction that required hospitalization no Has patient had a PCN reaction occurring within the last 10 years:no If all of the above answers are "NO", then may proceed with Cephalosporin use.   Statins    hallucinations      Medication List    STOP taking these medications   cephALEXin 250 MG capsule Commonly known as: KEFLEX     TAKE these medications   acidophilus Caps capsule Take 1 capsule by mouth daily.   aspirin EC 81 MG tablet Take 81 mg by mouth daily.   ceFAZolin  IVPB Commonly known as: ANCEF Inject 2 g into the vein every 8 (eight) hours for 25 days. Indication:  Osteomyelitis Last Day of Therapy:  03/30/2019 Labs weekly while on IV antibiotics: _X_ CBC with differential  _X_ CMP  _X_ ESR  _X_ Please  pull PIC at completion of IV antibiotics  Fax weekly labs to [phone]  Clinic Follow Up Appt in 2 weeks: Call [phone] to make appt   hydrochlorothiazide 12.5 MG tablet Commonly known as: HYDRODIURIL Take 12.5 mg by mouth every evening.   HYDROcodone-acetaminophen 5-325 MG tablet Commonly known as: NORCO/VICODIN Take 1-2 tablets by mouth every 6 (six) hours as needed for severe pain.   lisinopril 10 MG  tablet Commonly known as: ZESTRIL Take 10 mg by mouth daily.   metFORMIN 500 MG tablet Commonly known as: GLUCOPHAGE Take 500 mg by mouth 2 (two) times daily.   polyethylene glycol 17 g packet Commonly known as: MiraLax Take 17 g by mouth daily as needed.            Home Infusion Instuctions  (From admission, onward)         Start     Ordered   03/05/19 0000  Home infusion instructions [facility] May follow [place] Dosing Protocol; May administer Cathflo as needed to maintain patency of vascular access device.; Flushing of vascular access device: per [facility] Protocol: 0.9% NaCl pre/post medica...    Question Answer Comment  Instructions May follow [place] Dosing Protocol   Instructions May administer Cathflo as needed to maintain patency of vascular access device.   Instructions Flushing of vascular access device: per [facility] Protocol: 0.9% NaCl pre/post medication administration and prn patency; Heparin 100 u/ml, 62m for implanted ports and Heparin 10u/ml, 529mfor all other central venous catheters.   Instructions May follow [facility] Anaphylaxis Protocol for First Dose Administration in the home: 0.9% NaCl at 25-50 ml/hr to maintain IV access for protocol meds. Epinephrine 0.3 ml IV/IM PRN and Benadryl 25-50 IV/IM PRN s/s of anaphylaxis.   Instructions [facility] Infusion Coordinator (RN) to assist per patient IV care needs in the home PRN.      03/05/19 0825           DISCHARGE INSTRUCTIONS:  See AVS.   If you experience worsening of your admission symptoms, develop shortness of breath, life threatening emergency, suicidal or homicidal thoughts you must seek medical attention immediately by calling 911 or calling your MD immediately  if symptoms less severe.  You Must read complete instructions/literature along with all the possible adverse reactions/side effects for all the Medicines you take and that have been prescribed to you. Take any new Medicines after  you have completely understood and accpet all the possible adverse reactions/side effects.   Please note  You were cared for by a hospitalist during your hospital stay. If you have any questions about your discharge medications or the care you received while you were in the hospital after you are discharged, you can call the unit and asked to speak with the hospitalist on call if the hospitalist that took care of you is not available. Once you are discharged, your primary care physician will handle any further medical issues. Please note that NO REFILLS for any discharge medications will be authorized once you are discharged, as it is imperative that you return to your primary care physician (or establish a relationship with a primary care physician if you do not have one) for your aftercare needs so that they can reassess your need for medications and monitor your lab values.    On the day of Discharge:  VITAL SIGNS:  Blood pressure (!) 145/80, pulse (!) 58, temperature 97.8 F (36.6 C), temperature source Oral, resp. rate 18, height 5'  2" (1.575 m), weight 86.5 kg, SpO2 98 %. PHYSICAL EXAMINATION:  GENERAL:  73 y.o.-year-old patient lying in the bed with no acute distress.  EYES: Pupils equal, round, reactive to light and accommodation. No scleral icterus. Extraocular muscles intact.  HEENT: Head atraumatic, normocephalic. Oropharynx and nasopharynx clear.  NECK:  Supple, no jugular venous distention. No thyroid enlargement, no tenderness.  LUNGS: Normal breath sounds bilaterally, no wheezing, rales,rhonchi or crepitation. No use of accessory muscles of respiration.  CARDIOVASCULAR: S1, S2 normal. No murmurs, rubs, or gallops.  ABDOMEN: Soft, non-tender, non-distended. Bowel sounds present. No organomegaly or mass.  EXTREMITIES: No pedal edema, cyanosis, or clubbing.  Left foot in dressing. NEUROLOGIC: Cranial nerves II through XII are intact. Muscle strength 5/5 in all extremities. Sensation  intact. Gait not checked.  PSYCHIATRIC: The patient is alert and oriented x 3.  SKIN: No obvious rash, lesion, or ulcer.  DATA REVIEW:   CBC Recent Labs  Lab 03/03/19 0420  WBC 9.0  HGB 10.7*  HCT 33.7*  PLT 250    Chemistries  Recent Labs  Lab 03/01/19 1621  03/03/19 0420  NA 139   < > 136  K 3.5   < > 3.7  CL 106   < > 106  CO2 22   < > 25  GLUCOSE 110*   < > 132*  BUN 22   < > 16  CREATININE 1.05*   < > 0.86  CALCIUM 9.7   < > 8.1*  MG  --   --  1.7  AST 49*  --   --   ALT 30  --   --   ALKPHOS 136*  --   --   BILITOT 0.4  --   --    < > = values in this interval not displayed.     Microbiology Results  Results for orders placed or performed during the hospital encounter of 03/01/19  SARS Coronavirus 2 [facility] order, Performed in [facility] hospital lab) Nasopharyngeal Nasopharyngeal Swab     Status: None   Collection Time: 03/01/19  1:48 PM   Specimen: Nasopharyngeal Swab  Result Value Ref Range Status   SARS Coronavirus 2 NEGATIVE NEGATIVE Final    Comment: (NOTE) If result is NEGATIVE SARS-CoV-2 target nucleic acids are NOT DETECTED. The SARS-CoV-2 RNA is generally detectable in upper and lower  respiratory specimens during the acute phase of infection. The lowest  concentration of SARS-CoV-2 viral copies this assay can detect is 250  copies / mL. A negative result does not preclude SARS-CoV-2 infection  and should not be used as the sole basis for treatment or other  patient management decisions.  A negative result may occur with  improper specimen collection / handling, submission of specimen other  than nasopharyngeal swab, presence of viral mutation(s) within the  areas targeted by this assay, and inadequate number of viral copies  (<250 copies / mL). A negative result must be combined with clinical  observations, patient history, and epidemiological information. If result is POSITIVE SARS-CoV-2 target nucleic acids are DETECTED. The SARS-CoV-2  RNA is generally detectable in upper and lower  respiratory specimens dur ing the acute phase of infection.  Positive  results are indicative of active infection with SARS-CoV-2.  Clinical  correlation with patient history and other diagnostic information is  necessary to determine patient infection status.  Positive results do  not rule out bacterial infection or co-infection with other viruses. If result is PRESUMPTIVE POSTIVE SARS-CoV-2 nucleic  acids MAY BE PRESENT.   A presumptive positive result was obtained on the submitted specimen  and confirmed on repeat testing.  While 2019 novel coronavirus  (SARS-CoV-2) nucleic acids may be present in the submitted sample  additional confirmatory testing may be necessary for epidemiological  and / or clinical management purposes  to differentiate between  SARS-CoV-2 and other Sarbecovirus currently known to infect humans.  If clinically indicated additional testing with an alternate test  methodology [phone]) is advised. The SARS-CoV-2 RNA is generally  detectable in upper and lower respiratory sp ecimens during the acute  phase of infection. The expected result is Negative. Fact Sheet for Patients:  StrictlyIdeas.no Fact Sheet for Healthcare Providers: BankingDealers.co.za This test is not yet approved or cleared by the [place] FDA and has been authorized for detection and/or diagnosis of SARS-CoV-2 by FDA under an Emergency Use Authorization (EUA).  This EUA will remain in effect (meaning this test can be used) for the duration of the COVID-19 declaration under Section 564(b)(1) of the Act, 21 U.S.C. section 360bbb-3(b)(1), unless the authorization is terminated or revoked sooner. Performed at [facility], [address] [place], [place] [address]   MRSA PCR Screening     Status: None   Collection Time: 03/01/19 10:23 PM   Specimen: Nasopharyngeal  Result Value Ref  Range Status   MRSA by PCR NEGATIVE NEGATIVE Final    Comment:        The GeneXpert MRSA Assay (FDA approved for NASAL specimens only), is one component of a comprehensive MRSA colonization surveillance program. It is not intended to diagnose MRSA infection nor to guide or monitor treatment for MRSA infections. Performed at [facility], [address] [place], [place] [address]   Aerobic/Anaerobic Culture (surgical/deep wound)     Status: None (Preliminary result)   Collection Time: 03/02/19  7:54 AM   Specimen: Abscess  Result Value Ref Range Status   Specimen Description   Final    ABSCESS LEFT SECOND TOE JOINT Performed at [facility], [place] [address] [place], [place] [address]    Special Requests   Final    NONE Performed at [facility], [address] [place], [place] [address]    Gram Stain   Final    MODERATE WBC PRESENT,BOTH PMN AND MONONUCLEAR NO ORGANISMS SEEN    Culture   Final    NO GROWTH 2 DAYS NO ANAEROBES ISOLATED; CULTURE IN PROGRESS FOR 5 DAYS Performed at [facility], [place] [address], [place], [place] [address]    Report Status PENDING  Incomplete  Aerobic/Anaerobic Culture (surgical/deep wound)     Status: None (Preliminary result)   Collection Time: 03/02/19  7:56 AM   Specimen: Bone; Tissue  Result Value Ref Range Status   Specimen Description   Final    BONE LEFT FOOT SECOND METATARSAL Performed at [facility], [place] [address] [place], [place] [address]    Special Requests   Final    NONE Performed at [facility], [place], [place] [address]    Gram Stain NO WBC SEEN NO ORGANISMS SEEN   Final   Culture   Final    NO GROWTH 2 DAYS NO ANAEROBES ISOLATED; CULTURE IN PROGRESS FOR 5 DAYS Performed at [facility] [place] [facility], [place] [address] [place], [place] [address]    Report Status PENDING  Incomplete    RADIOLOGY:  [place] Ekg Site Rite  Result Date:  03/05/2019 If Site Rite  image not attached, placement could not be confirmed due to current cardiac rhythm.    Management plans discussed with the patient, family and they are in agreement.  CODE STATUS: DNR   TOTAL TIME TAKING CARE OF THIS PATIENT: 32 minutes.    Demetrios Loll M.D on 03/05/2019 at 10:01 AM  Between 7am to 6pm - Pager - [phone]  After 6pm go to [email or web address] - Proofreader  [facility]  Office  [phone]  CC: Primary care physician; Baxter Hire, MD   Note: This dictation was prepared with Dragon dictation along with smaller phrase technology. Any transcriptional errors that result from this process are unintentional.

## 2019-03-05 NOTE — Progress Notes (Signed)
Peripherally Inserted Central Catheter/Midline Placement  The IV Nurse has discussed with the patient and/or persons authorized to consent for the patient, the purpose of this procedure and the potential benefits and risks involved with this procedure.  The benefits include less needle sticks, lab draws from the catheter, and the patient may be discharged home with the catheter. Risks include, but not limited to, infection, bleeding, blood clot (thrombus formation), and puncture of an artery; nerve damage and irregular heartbeat and possibility to perform a PICC exchange if needed/ordered by physician.  Alternatives to this procedure were also discussed.  Bard Power PICC patient education guide, fact sheet on infection prevention and patient information card has been provided to patient /or left at bedside.    PICC/Midline Placement Documentation  PICC Single Lumen 10/18/16 PICC Right Brachial 42 cm 0 cm (Active)     PICC Single Lumen 03/05/19 PICC Right Brachial 39 cm 0 cm (Active)  Indication for Insertion or Continuance of Line Home intravenous therapies (PICC only) 03/05/19 1000  Exposed Catheter (cm) 0 cm 03/05/19 1000  Site Assessment Clean;Dry;Intact 03/05/19 1000  Line Status Flushed;Blood return noted 03/05/19 1000  Dressing Type Transparent 03/05/19 1000  Dressing Status Clean;Dry;Intact;Antimicrobial disc in place 03/05/19 1000  Dressing Change Due 03/12/19 03/05/19 1000       Jule Economy Horton 03/05/2019, 10:12 AM

## 2019-03-07 LAB — AEROBIC/ANAEROBIC CULTURE W GRAM STAIN (SURGICAL/DEEP WOUND)
Culture: NO GROWTH
Culture: NO GROWTH
Gram Stain: NONE SEEN

## 2019-03-20 ENCOUNTER — Other Ambulatory Visit: Payer: Self-pay | Admitting: Infectious Diseases

## 2019-03-20 ENCOUNTER — Ambulatory Visit: Payer: Medicare Other | Attending: Infectious Diseases | Admitting: Infectious Diseases

## 2019-03-20 ENCOUNTER — Encounter: Payer: Self-pay | Admitting: Infectious Diseases

## 2019-03-20 ENCOUNTER — Other Ambulatory Visit: Payer: Self-pay

## 2019-03-20 VITALS — BP 153/82 | HR 76 | Temp 97.9°F | Ht 62.0 in | Wt 184.0 lb

## 2019-03-20 DIAGNOSIS — I10 Essential (primary) hypertension: Secondary | ICD-10-CM

## 2019-03-20 DIAGNOSIS — Z7984 Long term (current) use of oral hypoglycemic drugs: Secondary | ICD-10-CM

## 2019-03-20 DIAGNOSIS — Z888 Allergy status to other drugs, medicaments and biological substances status: Secondary | ICD-10-CM

## 2019-03-20 DIAGNOSIS — B9561 Methicillin susceptible Staphylococcus aureus infection as the cause of diseases classified elsewhere: Secondary | ICD-10-CM

## 2019-03-20 DIAGNOSIS — Z880 Allergy status to penicillin: Secondary | ICD-10-CM

## 2019-03-20 DIAGNOSIS — E1169 Type 2 diabetes mellitus with other specified complication: Secondary | ICD-10-CM | POA: Diagnosis not present

## 2019-03-20 DIAGNOSIS — M869 Osteomyelitis, unspecified: Secondary | ICD-10-CM

## 2019-03-20 DIAGNOSIS — M86272 Subacute osteomyelitis, left ankle and foot: Secondary | ICD-10-CM

## 2019-03-20 DIAGNOSIS — Z89422 Acquired absence of other left toe(s): Secondary | ICD-10-CM

## 2019-03-20 DIAGNOSIS — Z79899 Other long term (current) drug therapy: Secondary | ICD-10-CM

## 2019-03-20 DIAGNOSIS — B954 Other streptococcus as the cause of diseases classified elsewhere: Secondary | ICD-10-CM

## 2019-03-20 DIAGNOSIS — L089 Local infection of the skin and subcutaneous tissue, unspecified: Secondary | ICD-10-CM

## 2019-03-20 DIAGNOSIS — Z95828 Presence of other vascular implants and grafts: Secondary | ICD-10-CM

## 2019-03-20 DIAGNOSIS — Z881 Allergy status to other antibiotic agents status: Secondary | ICD-10-CM

## 2019-03-20 NOTE — Patient Instructions (Addendum)
You are here for follow up of a left foot infection for which you were in the hopsital and underwent Excision distal second metatarsal on 03/02/19 Had 2nd toe amputation on 02/23/19 You are on cefazolin IV and will finish on 03/30/19 when the PICC line will be removed. You will go on PO omnicef 300mg  BID for a week after that.

## 2019-03-20 NOTE — Progress Notes (Signed)
NAME: Joann [name]  DOB: 1946-07-22  MRN: [id number]  Date/Time: 03/20/2019 12:06 PM   Subjective:  Patient here with her husband.  Follow-up after recent hospitalization.  ? Joann [name] is a 73 y.o. female with a history of diabetes mellitus, hypertension was recently in the hospital between 03/01/2019 until 03/05/2023 left foot infection.  She underwent distal second metatarsal excision on 03/02/2019.  On 02/23/2019 patient had a second toe amputation.  Staph aureus and strep viridans were present in the bone culture.  She was sent home on oral antibiotic.  As outpatient she was assessed and found to have residual infection.  So she was brought into the hospital to undergo distal second metatarsal amputation on 03/02/2019.  The bone culture from 03/02/2019 was negative for bacteria the biopsy the histopathology of the resected bone showed acute osteomyelitis with very focal acute inflammation present at the margin.  So she was sent home on IV cefazolin 2 g every 8 to complete 4 weeks of treatment.  Her last date of treatment is 03/30/2019. Her husband does the IV i injection. She is 100% adherent. She is doing well with no fever or chills or diarrhea or rash.  She was seen by Dr. Vickki Muff today in his clinic.Marland Kitchen Past Medical History:  Diagnosis Date  . Breast cancer ([place]) 2007   right breast lumpectomy with 36 rad tx  . Breast mass 11/23/2016   6 o'clock  . Diabetes mellitus without complication ([place])   . Hypertension     Past Surgical History:  Procedure Laterality Date  . AMPUTATION Left 02/23/2019   Procedure: AMPUTATION LEFT 2nd Toe;  Surgeon: Samara Deist, DPM;  Location: [facility] ORS;  Service: Podiatry;  Laterality: Left;  . BREAST BIOPSY Right 2007   positive.  Lumpectomy with rad tx  . BREAST LUMPECTOMY Right 2007  . INCISION AND DRAINAGE Left 10/16/2016   Procedure: INCISION AND DRAINAGE;  Surgeon: Albertine Patricia, DPM;  Location: [facility] ORS;  Service: Podiatry;  Laterality: Left;  .  IRRIGATION AND DEBRIDEMENT FOOT Left 03/02/2019   Procedure: IRRIGATION AND DEBRIDEMENT LEFT FOOT SECOND METATARSAL, DIABETIC;  Surgeon: Samara Deist, DPM;  Location: [facility] ORS;  Service: Podiatry;  Laterality: Left;    Social History   Socioeconomic History  . Marital status: Married    Spouse name: Not on file  . Number of children: Not on file  . Years of education: Not on file  . Highest education level: Not on file  Occupational History  . Not on file  Social Needs  . Financial resource strain: Not on file  . Food insecurity    Worry: Not on file    Inability: Not on file  . Transportation needs    Medical: Not on file    Non-medical: Not on file  Tobacco Use  . Smoking status: Never Smoker  . Smokeless tobacco: Never Used  Substance and Sexual Activity  . Alcohol use: No  . Drug use: No  . Sexual activity: Not on file  Lifestyle  . Physical activity    Days per week: Not on file    Minutes per session: Not on file  . Stress: Not on file  Relationships  . Social Herbalist on phone: Not on file    Gets together: Not on file    Attends religious service: Not on file    Active member of club or organization: Not on file    Attends meetings of clubs or organizations: Not  on file    Relationship status: Not on file  . Intimate partner violence    Fear of current or ex partner: Not on file    Emotionally abused: Not on file    Physically abused: Not on file    Forced sexual activity: Not on file  Other Topics Concern  . Not on file  Social History Narrative  . Not on file    Family History  Problem Relation Age of Onset  . Dementia Mother   . CAD Mother   . CAD Father   . Deep vein thrombosis Father   . Breast cancer Neg Hx    Allergies  Allergen Reactions  . Neosporin [Neomycin-Bacitracin Zn-Polymyx]     "it just don't agree with me"   . Penicillins Itching    Has patient had a PCN reaction causing immediate rash, facial/tongue/throat  swelling, SOB or lightheadedness with hypotension: no Has patient had a PCN reaction causing severe rash involving mucus membranes or skin necrosis: no Has patient had a PCN reaction that required hospitalization no Has patient had a PCN reaction occurring within the last 10 years:no If all of the above answers are "NO", then may proceed with Cephalosporin use.   . Statins     hallucinations   ? Current Outpatient Medications  Medication Sig Dispense Refill  . acidophilus (RISAQUAD) CAPS capsule Take 1 capsule by mouth daily. 30 capsule 0  . aspirin EC 81 MG tablet Take 81 mg by mouth daily.    Marland Kitchen ceFAZolin (ANCEF) IVPB Inject 2 g into the vein every 8 (eight) hours for 25 days. Indication:  Osteomyelitis Last Day of Therapy:  03/30/2019 Labs weekly while on IV antibiotics: _X_ CBC with differential  _X_ CMP  _X_ ESR  _X_ Please pull PIC at completion of IV antibiotics  Fax weekly labs to [phone]  Clinic Follow Up Appt in 2 weeks: Call [phone] to make appt 75 Units 0  . hydrochlorothiazide (HYDRODIURIL) 12.5 MG tablet Take 12.5 mg by mouth every evening.  0  . HYDROcodone-acetaminophen (NORCO/VICODIN) 5-325 MG tablet Take 1-2 tablets by mouth every 6 (six) hours as needed for severe pain. 14 tablet 0  . lisinopril (PRINIVIL,ZESTRIL) 10 MG tablet Take 10 mg by mouth daily.  0  . metFORMIN (GLUCOPHAGE) 500 MG tablet Take 500 mg by mouth 2 (two) times daily.  1  . polyethylene glycol (MIRALAX) 17 g packet Take 17 g by mouth daily as needed. 14 each 0   No current facility-administered medications for this visit.       REVIEW OF SYSTEMS:  Const: negative fever, negative chills, negative weight loss Eyes: negative diplopia or visual changes, negative eye pain ENT: negative coryza, negative sore throat Resp: negative cough, hemoptysis, dyspnea Cards: negative for chest pain, palpitations, lower extremity edema GU: negative for frequency, dysuria and hematuria GI:  Negative for abdominal pain, diarrhea, bleeding, constipation Skin: negative for rash and pruritus Heme: negative for easy bruising and gum/nose bleeding MS: negative for myalgias, arthralgias, back pain and muscle weakness Neurolo:negative for headaches, dizziness, vertigo, memory problems  Psych: negative for feelings of anxiety, depression  Endocrine: negative for thyroid, diabetes Allergy/Immunology-as listed above tive for any medication or food allergies ?  Objective:  VITALS:  BP (!) 153/82 (BP Location: Left Arm, Patient Position: Sitting, Cuff Size: Normal)   Pulse 76   Temp 97.9 F (36.6 C) (Oral)   Ht _0  (1.575 m)   Wt 184 lb (83.5 kg)  BMI 33.65 kg/m  PHYSICAL EXAM:  General: Alert, cooperative, no distress, appears stated age.  Head: Normocephalic, without obvious abnormality, atraumatic. Eyes: Conjunctivae clear, anicteric sclerae. Pupils are equal ENT Nares normal. No drainage or sinus tenderness. Lips, mucosa, and tongue normal. No Thrush Neck: Supple, symmetrical, no adenopathy, thyroid: non tender no carotid bruit and no JVD. Back: No CVA tenderness. Lungs: Clear to auscultation bilaterally. No Wheezing or Rhonchi. No rales. Heart: Regular rate and rhythm, no murmur, rub or gallop. Abdomen: Soft, non-tender,not distended. Bowel sounds normal. No masses Extremities: Right PIC site clean.  Surgical site well coapted with intact sutures.  No discharge or erythema. Skin: No rashes or lesions. Or bruising Lymph: Cervical, supraclavicular normal. Neurologic: Grossly non-focal Pertinent Labs Lab Results CBC    Component Value Date/Time   WBC 9.0 03/03/2019 0420   RBC 3.68 (L) 03/03/2019 0420   HGB 10.7 (L) 03/03/2019 0420   HGB 14.9 10/02/2013 1336   HCT 33.7 (L) 03/03/2019 0420   HCT 44.0 10/02/2013 1336   PLT 250 03/03/2019 0420   PLT 316 10/02/2013 1336   MCV 91.6 03/03/2019 0420   MCV 88 10/02/2013 1336   MCH 29.1 03/03/2019 0420   MCHC 31.8  03/03/2019 0420   RDW 13.6 03/03/2019 0420   RDW 13.7 10/02/2013 1336   LYMPHSABS 1.6 11/01/2016 0800   MONOABS 0.9 11/01/2016 0800   EOSABS 0.4 11/01/2016 0800   BASOSABS 0.0 11/01/2016 0800    CMP Latest Ref Rng & Units 03/03/2019 03/02/2019 03/01/2019  Glucose 70 - 99 mg/dL 132(H) 218(H) 110(H)  BUN 8 - 23 mg/dL _0 Creatinine 0.44 - 1.00 mg/dL 0.86 0.94 1.05(H)  Sodium 135 - 145 mmol/L 136 138 139  Potassium 3.5 - 5.1 mmol/L 3.7 3.1(L) 3.5  Chloride 98 - 111 mmol/L 106 107 106  CO2 22 - 32 mmol/L _1 Calcium 8.9 - 10.3 mg/dL 8.1(L) 8.8(L) 9.7  Total Protein 6.5 - 8.1 g/dL - - 8.3(H)  Total Bilirubin 0.3 - 1.2 mg/dL - - 0.4  Alkaline Phos 38 - 126 U/L - - 136(H)  AST 15 - 41 U/L - - 49(H)  ALT 0 - 44 U/L - - 30    IMAGING RESULTS: I have personally reviewed the films ? Impression/Recommendation ?  Left foot infection with second toe osteomyelitis at the resected margin.  Status post resection of the distal second metatarsal on 03/02/2019.  Culture was negative.  But the cultures from 02/23/2019 had staph aureus and strep viridans.  So she is currently on IV cefazolin to complete 4 weeks until 03/30/2019.  We will plan to give her p.o. Omnicef after that for a week or so.  Diabetes mellitus on metformin  Hypertension on lisinopril and hydrochlorothiazide.  . ? ? ___________________________________________________ Discussed with patient and her husband.   Note:  This document was prepared using Dragon voice recognition software and may include unintentional dictation errors.

## 2019-03-22 ENCOUNTER — Other Ambulatory Visit: Payer: Self-pay | Admitting: Infectious Diseases

## 2019-04-04 ENCOUNTER — Other Ambulatory Visit: Payer: Self-pay | Admitting: Infectious Diseases

## 2019-11-30 ENCOUNTER — Other Ambulatory Visit: Payer: Self-pay | Admitting: Podiatry

## 2019-11-30 DIAGNOSIS — L97522 Non-pressure chronic ulcer of other part of left foot with fat layer exposed: Secondary | ICD-10-CM

## 2019-12-11 ENCOUNTER — Ambulatory Visit
Admission: RE | Admit: 2019-12-11 | Discharge: 2019-12-11 | Disposition: A | Discharge status: Home / Self Care | Payer: Medicare Other | Source: Ambulatory Visit | Attending: Podiatry | Admitting: Podiatry

## 2019-12-11 ENCOUNTER — Other Ambulatory Visit: Payer: Self-pay

## 2019-12-11 DIAGNOSIS — L97522 Non-pressure chronic ulcer of other part of left foot with fat layer exposed: Secondary | ICD-10-CM | POA: Insufficient documentation

## 2020-04-17 ENCOUNTER — Other Ambulatory Visit: Payer: Self-pay | Admitting: Podiatry

## 2020-04-25 ENCOUNTER — Other Ambulatory Visit: Payer: Self-pay

## 2020-04-25 ENCOUNTER — Encounter
Admission: RE | Admit: 2020-04-25 | Discharge: 2020-04-25 | Disposition: A | Discharge status: Home / Self Care | Payer: Medicare Other | Source: Ambulatory Visit | Attending: Podiatry | Admitting: Podiatry

## 2020-04-25 DIAGNOSIS — Z01818 Encounter for other preprocedural examination: Secondary | ICD-10-CM | POA: Insufficient documentation

## 2020-04-25 NOTE — Progress Notes (Addendum)
[facility] Perioperative Services: Pre-Admission/Anesthesia Testing   Date: 04/25/20  Name: Joann [name] MRN:   [id number]  Re: Consideration of perioperative therapeutic ABX change in patient with PCN allergy  Request sent to: Samara Deist, DPM Notification mode: Routed and/or faxed via [facility]   Procedure: METATARSAL HEAD EXCISION 5th (Left Toe), BONE EXCISION (Left ), ADJACENT SKIN TRANSFER (Left ) Date of procedure: 05/02/2020  Notes: 1. Patient has a documented allergy to PCN.  Marland Kitchen Advising that PCN has caused her to experience low severity pruritis in the past.  2. Has received cephalosporins (cefazolin, cephalexin, cefdinir) in the past with no documented complications. 3. Screened as appropriate for cephalosporin use during medication reconciliation . No immediate angioedema, dysphagia, SOB, or anaphylaxis symptoms. . No severe rash involving mucous membranes or skin necrosis. . No hospital admissions related to side effects of PCN/cephalosporin use.  . No documented reaction to PCN or cephalosporin in the last 10 years.  Currently ordered preoperative prophylactic ABX: clindamycin.   Request: As an evidence based approach to reducing the rate of incidence for post-operative SSI and the development of MDROs, could an agent with narrower coverage for preoperative prophylaxis in this patient's upcoming surgical course be considered?  1. Specifically requesting change to cephalosporin (CEFAZOLIN).  2. Please have your staff communicate decision with me and I would be happy to change the orders in Epic as per your direction.   Things to consider: . Many patients report that they were "allergic" to PCN earlier in life, however this does not translate into a true lifelong allergy. Patients can lose sensitivity to specific IgE antibodies over time if PCN is avoided (Kleris & Lugar, 2019).  Marland Kitchen Up to 10% of the adult population and 15% of hospitalized  patients report an allergy to PCN, however clinical studies suggest that 90% of those reporting an allergy can tolerate PCN antibiotics (Kleris & Lugar, 2019).  . Cross-sensitivity between PCN and cephalosporins has been documented as being as high as 10%, however this estimation included data believed to have been collected in a setting where there was contamination. Newer data suggests that the prevalence of cross-sensitivity between PCN and cephalosporins is actually estimated to be closer to 1% (Hermanides et al., 2018).   . Patients labeled as PCN allergic, whether they are truly allergic or not, have been found to have inferior outcomes in terms of rates of serious infection, and these patients tend to have longer hospital stays ([place], 2019).  . Treatment related secondary infections, such as Clostridioides difficile, have been linked to the improper use of broad spectrum antibiotics in patients improperly labeled as PCN allergic (Kleris & Lugar, 2019).  Marland Kitchen Anaphylaxis from cephalosporins is rare and the evidence suggests that there is no increased risk of an anaphylactic type reaction when cephalosporins are used in a PCN allergic patient (Pichichero, 2006).   Citations Hermanides J, Lemkes BA, Prins JM, Hollmann MW, Terreehorst I. Presumed ?-Lactam Allergy and Cross-reactivity in the Operating Theater: A Practical Approach. Anesthesiology. 2018 Aug;129(2):335-342. doi: 10.1097/ALN.0000000000002252. PMID: 19622297.  Kleris, Killen., & Lugar, P. L. (2019). Things We Do For No Reason: Failing to Question a Penicillin Allergy History. Journal of hospital medicine, 14(10), [phone]. Advance online publication. https://www.wallace-middleton.info/  Pichichero, M. E. (2006). Cephalosporins can be prescribed safely for penicillin-allergic patients. Journal of family medicine, 55(2), 106-112. Accessed:  https://cdn.mdedge.com/files/s18fs-public/Document/September-2017/5502JFP_AppliedEvidence1.pdf  Honor Loh, MSN, APRN, FNP-C, CEN [facility]  Peri-operative Services Nurse Practitioner Phone: [phone] 04/25/20 9:35  AM

## 2020-04-25 NOTE — Patient Instructions (Signed)
Your procedure is scheduled on: 05-02-20 FRIDAY Report to Day Surgery on the 2nd floor of the [facility]. To find out your arrival time, please call [phone] between 1PM - 3PM on: 05-01-20 THURSDAY  REMEMBER: Instructions that are not followed completely may result in serious medical risk, up to and including death; or upon the discretion of your surgeon and anesthesiologist your surgery may need to be rescheduled.  Do not eat food after midnight the night before surgery.  No gum chewing, lozengers or hard candies.  You may however, drink WATER up to 2 hours before you are scheduled to arrive for your surgery. Do not drink anything within 2 hours of your scheduled arrival time.  Type 1 and Type 2 diabetics should only drink water.  In addition, your doctor has ordered for you to drink the provided  Gatorade G2 Drinking this carbohydrate drink up to two hours before surgery helps to reduce insulin resistance and improve patient outcomes. Please complete drinking 2 hours prior to scheduled arrival time.  TAKE THESE MEDICATIONS THE MORNING OF SURGERY WITH A SIP OF WATER: -NONE    Stop Metformin 2 days prior to surgery-LAST DOSE ON 04-29-20 TUESDAY  Follow recommendations from Cardiologist, Pulmonologist or PCP regarding stopping Aspirin, Coumadin, Plavix, Eliquis, Pradaxa, or Pletal-STOP ASPIRIN NOW  One week prior to surgery: Stop Anti-inflammatories (NSAIDS) such as Advil, Aleve, Ibuprofen, Motrin, Naproxen, Naprosyn and Aspirin based products such as Excedrin, Goodys Powder, BC Powder-OK TO TAKE TYLENOL IF NEEDED  Stop ANY OVER THE COUNTER supplements until after surgery. (You may continue taking Tylenol, Vitamin D, Vitamin B, and multivitamin.)  No Alcohol for 24 hours before or after surgery.  No Smoking including e-cigarettes for 24 hours prior to surgery.  No chewable tobacco products for at least 6 hours prior to surgery.  No nicotine patches on the day of  surgery.  Do not use any "recreational" drugs for at least a week prior to your surgery.  Please be advised that the combination of cocaine and anesthesia may have negative outcomes, up to and including death. If you test positive for cocaine, your surgery will be cancelled.  On the morning of surgery brush your teeth with toothpaste and water, you may rinse your mouth with mouthwash if you wish. Do not swallow any toothpaste or mouthwash.  Do not wear jewelry, make-up, hairpins, clips or nail polish.  Do not wear lotions, powders, or perfumes.   Do not shave 48 hours prior to surgery.   Contact lenses, hearing aids and dentures may not be worn into surgery.  Do not bring valuables to the hospital. [facility] is not responsible for any missing/lost belongings or valuables.   Use CHG Soap as directed on instruction sheet  Notify your doctor if there is any change in your medical condition (cold, fever, infection).  Wear comfortable clothing (specific to your surgery type) to the hospital.  Plan for stool softeners for home use; pain medications have a tendency to cause constipation. You can also help prevent constipation by eating foods high in fiber such as fruits and vegetables and drinking plenty of fluids as your diet allows.  After surgery, you can help prevent lung complications by doing breathing exercises.  Take deep breaths and cough every 1-2 hours. Your doctor may order a device called an Incentive Spirometer to help you take deep breaths. When coughing or sneezing, hold a pillow firmly against your incision with both hands. This is called "splinting." Doing this helps  protect your incision. It also decreases belly discomfort.  If you are being admitted to the hospital overnight, leave your suitcase in the car. After surgery it may be brought to your room.  If you are being discharged the day of surgery, you will not be allowed to drive home. You will need a responsible  adult (18 years or older) to drive you home and stay with you that night.   If you are taking public transportation, you will need to have a responsible adult (18 years or older) with you. Please confirm with your physician that it is acceptable to use public transportation.   Please call the [facility] at [phone] if you have any questions about these instructions.  Visitation Policy:  Patients undergoing a surgery or procedure may have one family member or support person with them as long as that person is not COVID-19 positive or experiencing its symptoms.  That person may remain in the waiting area during the procedure.  Inpatient Visitation Update:   In an effort to ensure the safety of our team members and our patients, we are implementing a change to our visitation policy:  Effective Monday, Aug. 9, at 7 a.m., inpatients will be allowed one support person.  o The support person may change daily.  o The support person must pass our screening, gel in and out, and wear a mask at all times, including in the patient's room.  o Patients must also wear a mask when staff or their support person are in the room.  o Masking is required regardless of vaccination status.  Systemwide, no visitors 17 or younger.

## 2020-04-29 ENCOUNTER — Other Ambulatory Visit
Admission: RE | Admit: 2020-04-29 | Discharge: 2020-04-29 | Disposition: A | Discharge status: Home / Self Care | Payer: Medicare Other | Source: Ambulatory Visit | Attending: Podiatry | Admitting: Podiatry

## 2020-04-29 ENCOUNTER — Other Ambulatory Visit: Payer: Self-pay

## 2020-04-29 DIAGNOSIS — Z01812 Encounter for preprocedural laboratory examination: Secondary | ICD-10-CM | POA: Diagnosis present

## 2020-04-29 DIAGNOSIS — Z20822 Contact with and (suspected) exposure to covid-19: Secondary | ICD-10-CM | POA: Diagnosis not present

## 2020-04-29 LAB — CBC
HCT: 44.2 % (ref 36.0–46.0)
Hemoglobin: 14.4 g/dL (ref 12.0–15.0)
MCH: 30.1 pg (ref 26.0–34.0)
MCHC: 32.6 g/dL (ref 30.0–36.0)
MCV: 92.5 fL (ref 80.0–100.0)
Platelets: 267 10*3/uL (ref 150–400)
RBC: 4.78 MIL/uL (ref 3.87–5.11)
RDW: 13.2 % (ref 11.5–15.5)
WBC: 8.7 10*3/uL (ref 4.0–10.5)
nRBC: 0 % (ref 0.0–0.2)

## 2020-04-29 LAB — SARS CORONAVIRUS 2 (TAT 6-24 HRS): SARS Coronavirus 2: NEGATIVE

## 2020-05-02 ENCOUNTER — Ambulatory Visit: Payer: Medicare Other

## 2020-05-02 ENCOUNTER — Encounter: Payer: Self-pay | Admitting: Podiatry

## 2020-05-02 ENCOUNTER — Ambulatory Visit
Admission: RE | Admit: 2020-05-02 | Discharge: 2020-05-02 | Disposition: A | Discharge status: Home / Self Care | Payer: Medicare Other | Attending: Podiatry | Admitting: Podiatry

## 2020-05-02 ENCOUNTER — Encounter
Admission: RE | Disposition: A | Discharge status: Home / Self Care | Payer: Self-pay | Source: Home / Self Care | Attending: Podiatry

## 2020-05-02 ENCOUNTER — Other Ambulatory Visit: Payer: Self-pay

## 2020-05-02 ENCOUNTER — Ambulatory Visit: Payer: Medicare Other | Admitting: Urgent Care

## 2020-05-02 DIAGNOSIS — Z880 Allergy status to penicillin: Secondary | ICD-10-CM | POA: Insufficient documentation

## 2020-05-02 DIAGNOSIS — Z888 Allergy status to other drugs, medicaments and biological substances status: Secondary | ICD-10-CM | POA: Diagnosis not present

## 2020-05-02 DIAGNOSIS — L97428 Non-pressure chronic ulcer of left heel and midfoot with other specified severity: Secondary | ICD-10-CM | POA: Insufficient documentation

## 2020-05-02 DIAGNOSIS — Z8249 Family history of ischemic heart disease and other diseases of the circulatory system: Secondary | ICD-10-CM | POA: Diagnosis not present

## 2020-05-02 DIAGNOSIS — E11621 Type 2 diabetes mellitus with foot ulcer: Secondary | ICD-10-CM | POA: Insufficient documentation

## 2020-05-02 DIAGNOSIS — E785 Hyperlipidemia, unspecified: Secondary | ICD-10-CM | POA: Insufficient documentation

## 2020-05-02 DIAGNOSIS — L97528 Non-pressure chronic ulcer of other part of left foot with other specified severity: Secondary | ICD-10-CM | POA: Diagnosis not present

## 2020-05-02 DIAGNOSIS — Z7984 Long term (current) use of oral hypoglycemic drugs: Secondary | ICD-10-CM | POA: Insufficient documentation

## 2020-05-02 DIAGNOSIS — E669 Obesity, unspecified: Secondary | ICD-10-CM | POA: Diagnosis not present

## 2020-05-02 DIAGNOSIS — Z6833 Body mass index (BMI) 33.0-33.9, adult: Secondary | ICD-10-CM | POA: Diagnosis not present

## 2020-05-02 DIAGNOSIS — I10 Essential (primary) hypertension: Secondary | ICD-10-CM | POA: Diagnosis not present

## 2020-05-02 DIAGNOSIS — L97529 Non-pressure chronic ulcer of other part of left foot with unspecified severity: Secondary | ICD-10-CM | POA: Diagnosis present

## 2020-05-02 DIAGNOSIS — Z881 Allergy status to other antibiotic agents status: Secondary | ICD-10-CM | POA: Insufficient documentation

## 2020-05-02 DIAGNOSIS — M899 Disorder of bone, unspecified: Secondary | ICD-10-CM | POA: Diagnosis not present

## 2020-05-02 DIAGNOSIS — Z7982 Long term (current) use of aspirin: Secondary | ICD-10-CM | POA: Diagnosis not present

## 2020-05-02 DIAGNOSIS — Z853 Personal history of malignant neoplasm of breast: Secondary | ICD-10-CM | POA: Diagnosis not present

## 2020-05-02 HISTORY — PX: DERMAGRAFT APPLICATION: SHX6444

## 2020-05-02 HISTORY — PX: METATARSAL HEAD EXCISION: SHX5027

## 2020-05-02 HISTORY — PX: BONE EXCISION: SHX6730

## 2020-05-02 LAB — GLUCOSE, CAPILLARY
Glucose-Capillary: 140 mg/dL — ABNORMAL HIGH (ref 70–99)
Glucose-Capillary: 99 mg/dL (ref 70–99)

## 2020-05-02 SURGERY — EXCISION, METATARSAL BONE, HEAD
Anesthesia: General | Site: Toe | Laterality: Left

## 2020-05-02 MED ORDER — ACETAMINOPHEN 10 MG/ML IV SOLN
INTRAVENOUS | Status: DC | PRN
  Administered 2020-05-02: 1000 mg via INTRAVENOUS

## 2020-05-02 MED ORDER — ACETAMINOPHEN 10 MG/ML IV SOLN
INTRAVENOUS | Status: AC
  Filled 2020-05-02: qty 100

## 2020-05-02 MED ORDER — LIDOCAINE HCL (PF) 1 % IJ SOLN
INTRAMUSCULAR | Status: DC | PRN
  Administered 2020-05-02: 5 mL

## 2020-05-02 MED ORDER — MIDAZOLAM HCL 2 MG/2ML IJ SOLN
INTRAMUSCULAR | Status: AC
  Filled 2020-05-02: qty 2

## 2020-05-02 MED ORDER — PHENYLEPHRINE HCL (PRESSORS) 10 MG/ML IV SOLN
INTRAVENOUS | Status: DC | PRN
  Administered 2020-05-02 (×3): 50 ug via INTRAVENOUS

## 2020-05-02 MED ORDER — PROPOFOL 10 MG/ML IV BOLUS
INTRAVENOUS | Status: DC | PRN
  Administered 2020-05-02: 20 mg via INTRAVENOUS
  Administered 2020-05-02: 40 mg via INTRAVENOUS

## 2020-05-02 MED ORDER — ONDANSETRON HCL 4 MG/2ML IJ SOLN: 4.0000 mg | Freq: Four times a day (QID) | INTRAMUSCULAR | Status: DC | PRN

## 2020-05-02 MED ORDER — FENTANYL CITRATE (PF) 100 MCG/2ML IJ SOLN: 25.0000 ug | INTRAMUSCULAR | Status: DC | PRN

## 2020-05-02 MED ORDER — FENTANYL CITRATE (PF) 100 MCG/2ML IJ SOLN
INTRAMUSCULAR | Status: DC | PRN
  Administered 2020-05-02: 25 ug via INTRAVENOUS

## 2020-05-02 MED ORDER — PHENYLEPHRINE HCL (PRESSORS) 10 MG/ML IV SOLN
INTRAVENOUS | Status: AC
  Filled 2020-05-02: qty 1

## 2020-05-02 MED ORDER — CHLORHEXIDINE GLUCONATE 0.12 % MT SOLN
OROMUCOSAL | Status: AC
  Administered 2020-05-02: 15 mL via OROMUCOSAL
  Filled 2020-05-02: qty 15

## 2020-05-02 MED ORDER — FAMOTIDINE 20 MG PO TABS: 20.0000 mg | ORAL_TABLET | Freq: Once | ORAL | Status: AC

## 2020-05-02 MED ORDER — PROPOFOL 500 MG/50ML IV EMUL
INTRAVENOUS | Status: DC | PRN
  Administered 2020-05-02: 100 ug/kg/min via INTRAVENOUS

## 2020-05-02 MED ORDER — SODIUM CHLORIDE 0.9 % IV SOLN: INTRAVENOUS | Status: DC

## 2020-05-02 MED ORDER — OXYCODONE HCL 5 MG PO TABS: 5.0000 mg | ORAL_TABLET | Freq: Once | ORAL | Status: DC | PRN

## 2020-05-02 MED ORDER — LIDOCAINE HCL (CARDIAC) PF 100 MG/5ML IV SOSY
PREFILLED_SYRINGE | INTRAVENOUS | Status: DC | PRN
  Administered 2020-05-02: 60 mg via INTRAVENOUS

## 2020-05-02 MED ORDER — HYDROCODONE-ACETAMINOPHEN 5-325 MG PO TABS
1.0000 | ORAL_TABLET | ORAL | 0 refills | Status: DC | PRN
Start: 2020-05-02 — End: 2024-04-09

## 2020-05-02 MED ORDER — CLINDAMYCIN PHOSPHATE 900 MG/50ML IV SOLN
INTRAVENOUS | Status: AC
  Filled 2020-05-02: qty 50

## 2020-05-02 MED ORDER — BUPIVACAINE HCL 0.5 % IJ SOLN
INTRAMUSCULAR | Status: DC | PRN
  Administered 2020-05-02: 5 mL

## 2020-05-02 MED ORDER — FENTANYL CITRATE (PF) 100 MCG/2ML IJ SOLN
INTRAMUSCULAR | Status: AC
  Filled 2020-05-02: qty 2

## 2020-05-02 MED ORDER — FAMOTIDINE 20 MG PO TABS
ORAL_TABLET | ORAL | Status: AC
  Administered 2020-05-02: 20 mg via ORAL
  Filled 2020-05-02: qty 1

## 2020-05-02 MED ORDER — DEXMEDETOMIDINE (PRECEDEX) IN NS 20 MCG/5ML (4 MCG/ML) IV SYRINGE
PREFILLED_SYRINGE | INTRAVENOUS | Status: AC
  Filled 2020-05-02: qty 5

## 2020-05-02 MED ORDER — OXYCODONE HCL 5 MG/5ML PO SOLN: 5.0000 mg | Freq: Once | ORAL | Status: DC | PRN

## 2020-05-02 MED ORDER — GLYCOPYRROLATE 0.2 MG/ML IJ SOLN
INTRAMUSCULAR | Status: DC | PRN
  Administered 2020-05-02 (×2): .1 mg via INTRAVENOUS

## 2020-05-02 MED ORDER — POVIDONE-IODINE 7.5 % EX SOLN
Freq: Once | CUTANEOUS | Status: DC
  Filled 2020-05-02: qty 118

## 2020-05-02 MED ORDER — DOXYCYCLINE HYCLATE 100 MG PO CAPS: 100.0000 mg | ORAL_CAPSULE | Freq: Two times a day (BID) | ORAL | 0 refills | Status: AC

## 2020-05-02 MED ORDER — ONDANSETRON HCL 4 MG PO TABS: 4.0000 mg | ORAL_TABLET | Freq: Four times a day (QID) | ORAL | Status: DC | PRN

## 2020-05-02 MED ORDER — LIDOCAINE HCL (PF) 2 % IJ SOLN
INTRAMUSCULAR | Status: AC
  Filled 2020-05-02: qty 10

## 2020-05-02 MED ORDER — DEXAMETHASONE SODIUM PHOSPHATE 10 MG/ML IJ SOLN
INTRAMUSCULAR | Status: AC
  Filled 2020-05-02: qty 1

## 2020-05-02 MED ORDER — CLINDAMYCIN PHOSPHATE 900 MG/50ML IV SOLN
900.0000 mg | INTRAVENOUS | Status: AC
  Administered 2020-05-02: 900 mg via INTRAVENOUS

## 2020-05-02 MED ORDER — PROMETHAZINE HCL 25 MG/ML IJ SOLN: 6.2500 mg | INTRAMUSCULAR | Status: DC | PRN

## 2020-05-02 MED ORDER — CHLORHEXIDINE GLUCONATE 0.12 % MT SOLN: 15.0000 mL | Freq: Once | OROMUCOSAL | Status: AC

## 2020-05-02 MED ORDER — ONDANSETRON HCL 4 MG/2ML IJ SOLN
INTRAMUSCULAR | Status: AC
  Filled 2020-05-02: qty 2

## 2020-05-02 MED ORDER — MEPERIDINE HCL 50 MG/ML IJ SOLN: 6.2500 mg | INTRAMUSCULAR | Status: DC | PRN

## 2020-05-02 MED ORDER — ORAL CARE MOUTH RINSE: 15.0000 mL | Freq: Once | OROMUCOSAL | Status: AC

## 2020-05-02 MED ORDER — ONDANSETRON HCL 4 MG/2ML IJ SOLN
INTRAMUSCULAR | Status: DC | PRN
  Administered 2020-05-02: 4 mg via INTRAVENOUS

## 2020-05-02 MED ORDER — GLYCOPYRROLATE 0.2 MG/ML IJ SOLN
INTRAMUSCULAR | Status: AC
  Filled 2020-05-02: qty 1

## 2020-05-02 MED ORDER — MIDAZOLAM HCL 2 MG/2ML IJ SOLN
INTRAMUSCULAR | Status: DC | PRN
  Administered 2020-05-02 (×2): 1 mg via INTRAVENOUS

## 2020-05-02 SURGICAL SUPPLY — 93 items
BLADE MED AGGRESSIVE (BLADE) IMPLANT
BLADE OSC/SAGITTAL MD 5.5X18 (BLADE) ×3 IMPLANT
BLADE OSCILLATING/SAGITTAL (BLADE)
BLADE SURG 15 STRL LF DISP TIS (BLADE) ×4 IMPLANT
BLADE SURG 15 STRL SS (BLADE) ×6
BLADE SURG MINI STRL (BLADE) ×3 IMPLANT
BLADE SW THK.38XMED LNG THN (BLADE) IMPLANT
BNDG CMPR STD VLCR NS LF 5.8X4 (GAUZE/BANDAGES/DRESSINGS) ×2
BNDG CMPR STD VLCR NS LF 5.8X6 (GAUZE/BANDAGES/DRESSINGS) ×4
BNDG COHESIVE 4X5 TAN STRL (GAUZE/BANDAGES/DRESSINGS) ×3 IMPLANT
BNDG COHESIVE 6X5 TAN STRL LF (GAUZE/BANDAGES/DRESSINGS) IMPLANT
BNDG CONFORM 2 STRL LF (GAUZE/BANDAGES/DRESSINGS) ×3 IMPLANT
BNDG CONFORM 3 STRL LF (GAUZE/BANDAGES/DRESSINGS) ×3 IMPLANT
BNDG ELASTIC 4X5.8 VLCR NS LF (GAUZE/BANDAGES/DRESSINGS) ×3 IMPLANT
BNDG ELASTIC 4X5.8 VLCR STR LF (GAUZE/BANDAGES/DRESSINGS) ×3 IMPLANT
BNDG ELASTIC 6X5.8 VLCR NS LF (GAUZE/BANDAGES/DRESSINGS) ×6 IMPLANT
BNDG ESMARK 4X12 TAN STRL LF (GAUZE/BANDAGES/DRESSINGS) ×3 IMPLANT
BNDG GAUZE 4.5X4.1 6PLY STRL (MISCELLANEOUS) ×3 IMPLANT
CANISTER SUCT 1200ML W/VALVE (MISCELLANEOUS) ×3 IMPLANT
CANISTER SUCT 3000ML PPV (MISCELLANEOUS) ×3 IMPLANT
CNTNR SPEC 2.5X3XGRAD LEK (MISCELLANEOUS) ×2
CONT SPEC 4OZ STER OR WHT (MISCELLANEOUS) ×1
CONT SPEC 4OZ STRL OR WHT (MISCELLANEOUS) ×2
CONTAINER SPEC 2.5X3XGRAD LEK (MISCELLANEOUS) ×2 IMPLANT
COVER WAND RF STERILE (DRAPES) ×3 IMPLANT
CUFF TOURN SGL QUICK 12 (TOURNIQUET CUFF) IMPLANT
CUFF TOURN SGL QUICK 18X4 (TOURNIQUET CUFF) ×3 IMPLANT
DRAPE FLUOR MINI C-ARM 54X84 (DRAPES) ×3 IMPLANT
DRAPE XRAY CASSETTE 23X24 (DRAPES) IMPLANT
DRSG MEPILEX FLEX 3X3 (GAUZE/BANDAGES/DRESSINGS) IMPLANT
DRSG TELFA 4X3 1S NADH ST (GAUZE/BANDAGES/DRESSINGS) ×3 IMPLANT
DURAPREP 26ML APPLICATOR (WOUND CARE) ×3 IMPLANT
ELECT REM PT RETURN 9FT ADLT (ELECTROSURGICAL) ×3
ELECTRODE REM PT RTRN 9FT ADLT (ELECTROSURGICAL) ×2 IMPLANT
GAUZE 4X4 16PLY RFD (DISPOSABLE) ×3 IMPLANT
GAUZE PACKING 1/4 X5 YD (GAUZE/BANDAGES/DRESSINGS) ×3 IMPLANT
GAUZE PACKING IODOFORM 1X5 (PACKING) ×3 IMPLANT
GAUZE SPONGE 4X4 12PLY STRL (GAUZE/BANDAGES/DRESSINGS) ×3 IMPLANT
GAUZE XEROFORM 1X8 LF (GAUZE/BANDAGES/DRESSINGS) ×3 IMPLANT
GLOVE BIO SURGEON STRL SZ7.5 (GLOVE) ×3 IMPLANT
GLOVE INDICATOR 8.0 STRL GRN (GLOVE) ×3 IMPLANT
GOWN STRL REUS W/ TWL XL LVL3 (GOWN DISPOSABLE) ×4 IMPLANT
GOWN STRL REUS W/TWL MED LVL3 (GOWN DISPOSABLE) ×6 IMPLANT
GOWN STRL REUS W/TWL XL LVL3 (GOWN DISPOSABLE) ×6
HANDPIECE VERSAJET DEBRIDEMENT (MISCELLANEOUS) IMPLANT
IV NS 1000ML (IV SOLUTION) ×3
IV NS 1000ML BAXH (IV SOLUTION) ×2 IMPLANT
KIT TURNOVER KIT A (KITS) ×3 IMPLANT
LABEL OR SOLS (LABEL) ×3 IMPLANT
NEEDLE FILTER BLUNT 18X 1/2SAF (NEEDLE) ×1
NEEDLE FILTER BLUNT 18X1 1/2 (NEEDLE) ×2 IMPLANT
NEEDLE HYPO 22GX1.5 SAFETY (NEEDLE) ×3 IMPLANT
NEEDLE HYPO 25X1 1.5 SAFETY (NEEDLE) ×3 IMPLANT
NS IRRIG 500ML POUR BTL (IV SOLUTION) ×3 IMPLANT
PACK EXTREMITY (MISCELLANEOUS) ×3 IMPLANT
PAD ABD DERMACEA PRESS 5X9 (GAUZE/BANDAGES/DRESSINGS) ×3 IMPLANT
PAD CAST CTTN 4X4 STRL (SOFTGOODS) ×2 IMPLANT
PAD PREP 24X41 OB/GYN DISP (PERSONAL CARE ITEMS) ×3 IMPLANT
PADDING CAST COTTON 4X4 STRL (SOFTGOODS) ×3
PENCIL ELECTRO HAND CTR (MISCELLANEOUS) ×3 IMPLANT
PIN BALLS 3/8 F/.054-.062 WIRE (MISCELLANEOUS) IMPLANT
PULSAVAC PLUS IRRIG FAN TIP (DISPOSABLE) ×3
PUNCH BIOPSY 4MM (MISCELLANEOUS) ×3
PUNCH BIOPSY DISP 4 (MISCELLANEOUS) ×2 IMPLANT
RASP SM TEAR CROSS CUT (RASP) IMPLANT
SHIELD FULL FACE ANTIFOG 7M (MISCELLANEOUS) ×3 IMPLANT
SOL .9 NS 3000ML IRR  AL (IV SOLUTION)
SOL .9 NS 3000ML IRR AL (IV SOLUTION)
SOL .9 NS 3000ML IRR UROMATIC (IV SOLUTION) IMPLANT
SOL PREP PVP 2OZ (MISCELLANEOUS) ×3
SOLUTION PREP PVP 2OZ (MISCELLANEOUS) ×2 IMPLANT
STOCKINETTE IMPERVIOUS 9X36 MD (GAUZE/BANDAGES/DRESSINGS) ×3 IMPLANT
STOCKINETTE STRL 6IN 960660 (GAUZE/BANDAGES/DRESSINGS) ×3 IMPLANT
STRAP SAFETY 5IN WIDE (MISCELLANEOUS) ×3 IMPLANT
STRIP CLOSURE SKIN 1/4X4 (GAUZE/BANDAGES/DRESSINGS) ×3 IMPLANT
SUT ETHILON 2 0 FS 18 (SUTURE) IMPLANT
SUT ETHILON 4-0 (SUTURE) ×3
SUT ETHILON 4-0 FS2 18XMFL BLK (SUTURE) ×2
SUT ETHILON 5-0 FS-2 18 BLK (SUTURE) ×3 IMPLANT
SUT MNCRL 4-0 (SUTURE) ×3
SUT MNCRL 4-0 27XMFL (SUTURE) ×2
SUT VIC AB 3-0 SH 27 (SUTURE) ×3
SUT VIC AB 3-0 SH 27X BRD (SUTURE) ×2 IMPLANT
SUT VIC AB 4-0 FS2 27 (SUTURE) ×3 IMPLANT
SUTURE ETHLN 4-0 FS2 18XMF BLK (SUTURE) ×2 IMPLANT
SUTURE MNCRL 4-0 27XMF (SUTURE) ×2 IMPLANT
SWAB CULTURE AMIES ANAERIB BLU (MISCELLANEOUS) IMPLANT
SYR 10ML LL (SYRINGE) ×6 IMPLANT
SYR 50ML LL SCALE MARK (SYRINGE) ×3 IMPLANT
SYR BULB IRRIG 60ML STRL (SYRINGE) ×3 IMPLANT
TIP FAN IRRIG PULSAVAC PLUS (DISPOSABLE) ×2 IMPLANT
WIRE Z .045 C-WIRE SPADE TIP (WIRE) IMPLANT
WIRE Z .062 C-WIRE SPADE TIP (WIRE) IMPLANT

## 2020-05-02 NOTE — Transfer of Care (Signed)
Immediate Anesthesia Transfer of Care Note  Patient: Joann [name]  Procedure(s) Performed: METATARSAL HEAD EXCISION 5th (Left Toe) BONE EXCISION (Left ) ADJACENT SKIN TRANSFER (Left )  Patient Location: PACU  Anesthesia Type:General  Level of Consciousness: awake, alert  and oriented  Airway & Oxygen Therapy: Patient connected to face mask oxygen  Post-op Assessment: Post -op Vital signs reviewed and stable  Post vital signs: stable  Last Vitals:  Vitals Value Taken Time  BP 101/69   Temp    Pulse 84 05/02/20 1116  Resp 15 05/02/20 1116  SpO2 98 % 05/02/20 1116  Vitals shown include unvalidated device data.  Last Pain:  Vitals:   05/02/20 0757  TempSrc: Temporal  PainSc: 0-No pain         Complications: No complications documented.

## 2020-05-02 NOTE — Discharge Instructions (Signed)
       AMBULATORY SURGERY  DISCHARGE INSTRUCTIONS   1) The drugs that you were given will stay in your system until tomorrow so for the next 24 hours you should not:  A) Drive an automobile B) Make any legal decisions C) Drink any alcoholic beverage   2) You may resume regular meals tomorrow.  Today it is better to start with liquids and gradually work up to solid foods.  You may eat anything you prefer, but it is better to start with liquids, then soup and crackers, and gradually work up to solid foods.   3) Please notify your doctor immediately if you have any unusual bleeding, trouble breathing, redness and pain at the surgery site, drainage, fever, or pain not relieved by medication.    4) Additional Instructions:        Please contact your physician with any problems or Same Day Surgery at [phone], Monday through Friday 6 am to 4 pm, or  at [place] Main number at [phone].   [facility]  POST OPERATIVE INSTRUCTIONS FOR DR. TROXLER, DR. FOWLER, AND DR. [facility] PODIATRY DEPARTMENT   1. Take your medication as prescribed.  Pain medication should be taken only as needed.  2. Keep the dressing clean, dry and intact.  3. Keep your foot elevated above the heart level for the first 48 hours.  4. Walking to the bathroom and brief periods of walking are acceptable, unless we have instructed you to be non-weight bearing.  5. Always wear your post-op shoe when walking.  Always use your crutches if you are to be non-weight bearing.  6. Do not take a shower. Baths are permissible as long as the foot is kept out of the water.   7. Every hour you are awake:  - Bend your knee 15 times. - Flex foot 15 times - Massage calf 15 times  8. Call [facility] ([phone]) if any of the following problems occur: - You develop a temperature or fever. - The bandage becomes saturated with  blood. - Medication does not stop your pain. - Injury of the foot occurs. - Any symptoms of infection including redness, odor, or red streaks running from wound.  

## 2020-05-02 NOTE — Anesthesia Postprocedure Evaluation (Signed)
Anesthesia Post Note  Patient: Joann [name]  Procedure(s) Performed: METATARSAL HEAD EXCISION 5th (Left Toe) BONE EXCISION (Left ) ADJACENT SKIN TRANSFER (Left )  Patient location during evaluation: PACU Anesthesia Type: General Level of consciousness: awake and alert and oriented Pain management: pain level controlled Vital Signs Assessment: post-procedure vital signs reviewed and stable Respiratory status: spontaneous breathing, nonlabored ventilation and respiratory function stable Cardiovascular status: blood pressure returned to baseline and stable Postop Assessment: no signs of nausea or vomiting Anesthetic complications: no   No complications documented.   Last Vitals:  Vitals:   05/02/20 1147 05/02/20 1204  BP: 113/76 (!) 144/90  Pulse: 82 81  Resp: 19 16  Temp: (!) 36.3 C 36.4 C  SpO2: 95% 97%    Last Pain:  Vitals:   05/02/20 1204  TempSrc: Temporal  PainSc: 0-No pain                 Kamren Heintzelman

## 2020-05-02 NOTE — Op Note (Signed)
Operative note   Surgeon:Brigham Cobbins Lawyer: None    Preop diagnosis: 1.  Full-thickness ulcer left great toe interphalangeal joint 2.  Exostosis interphalangeal joint left great toe 3.  Full-thickness ulceration plantar fifth metatarsal phalangeal joint    Postop diagnosis: Same    Procedure: 1.  Exostectomy interphalangeal joint left great toe 2.  Local skin flap left interphalangeal joint and ulceration great toe 3.  Excision distal fifth metatarsal left foot 4.  Full-thickness excisional debridement Subcutaneous tissue left fifth MTPJ ulceration. 5.  Use of intraoperative fluoroscopy without need of radiology interpretation    EBL: Minimal    Anesthesia:local and IV sedation    Hemostasis: Ankle tourniquet inflated to 200 mmHg for approximately 50 minutes    Specimen: 1.  Bone for culture left great toe 2.  Bone for pathology left great toe 3.  Bone for pathology left fifth metatarsal head 4.  Fluid for culture left fifth MTPJ    Complications: None    Operative indications:Joann [name] is an 74 y.o. that presents today for surgical intervention.  The risks/benefits/alternatives/complications have been discussed and consent has been given.    Procedure:  Patient was brought into the OR and placed on the operating table in thesupine position. After anesthesia was obtained theleft lower extremity was prepped and draped in usual sterile fashion.  Attention was initially directed at the large ulceration at the interphalangeal joint of the left great toe.  Full-thickness dissection was taken and all of the ulceration was taken down to the level of periosteum.  At this time the prominent bone was noted on fluoroscopy.  Next with a power saw the exostosis was removed.  This was then smoothed with a power rasp.  A small portion of the bone was sent for bone culture.  A 3 mm punch biopsy was performed of the medullary bone and sent for pathological examination.  The wound was  flushed with copious amounts of irrigation.  All nonviable tissue had been removed.  Next the wound bed was prepped for the local skin flap.  The ulceration itself measured 1.2 cm.  A skin flap approximately three quarters of the width of the ulceration was then performed just medial and superior to the ulceration.  Full-thickness flap was created.  This was then transposed into the open ulceration.  This was stabilized with 4-0 Vicryl and the skin was then reapproximated with a 5-0 nylon.  Complete closure was noted at this time.  No tension to the incisions were noted.  Attention was then directed to the fifth MTPJ where a dorsal lateral incision was performed.  Sharp and blunt dissection carried down to the capsule.  The capsule was then entered.  The distal portion of the fifth metatarsal was noted.  At this time with a power saw this portion was removed approximately 1 cm of bone was removed angulated from dorsal distal to proximal plantar.  This was sent for pathological examination.  A deep culture of the fluid in this region was then performed.  The wound was flushed with copious amounts of irrigation.  Closure of the incision was then performed with a combination of 3-0 Vicryl and 4-0 nylon.  Finally the plantar left fifth MTPJ ulceration was debrided full-thickness with a 15 blade to the level of subcutaneous tissue.  Subcutaneous tissue was removed.  Predebridement measurements were approximately 5 mm and post debridement measurements at 8 mm with a depth to the level of subcutaneous tissue approximately  6 mm in in depth.  Intraoperative fluoroscopy was used throughout the procedure to note removal of bone.  All wounds were flushed copiously prior to closure.  A bulky sterile dressing was applied to all areas.    Patient tolerated the procedure and anesthesia well.  Was transported from the OR to the PACU with all vital signs stable and vascular status intact. To be discharged per routine  protocol.  Will follow up in approximately 1 week in the outpatient clinic.

## 2020-05-02 NOTE — Anesthesia Preprocedure Evaluation (Signed)
Anesthesia Evaluation  Patient identified by MRN, date of birth, ID band Patient awake    Reviewed: Allergy & Precautions, NPO status , Patient's Chart, lab work & pertinent test results  History of Anesthesia Complications Negative for: history of anesthetic complications  Airway Mallampati: II  TM Distance: >3 FB Neck ROM: Full    Dental  (+) Poor Dentition   Pulmonary neg pulmonary ROS, neg sleep apnea, neg COPD,    breath sounds clear to auscultation- rhonchi (-) wheezing      Cardiovascular hypertension, Pt. on medications (-) CAD, (-) Past MI, (-) Cardiac Stents and (-) CABG  Rhythm:Regular Rate:Normal - Systolic murmurs and - Diastolic murmurs    Neuro/Psych neg Seizures negative neurological ROS  negative psych ROS   GI/Hepatic negative GI ROS, Neg liver ROS,   Endo/Other  diabetes, Oral Hypoglycemic Agents  Renal/GU negative Renal ROS     Musculoskeletal negative musculoskeletal ROS (+)   Abdominal (+) + obese,   Peds  Hematology negative hematology ROS (+)   Anesthesia Other Findings Past Medical History: 2007: Breast cancer ([place])     Comment:  right breast lumpectomy with 36 rad tx 11/23/2016: Breast mass     Comment:  6 o'clock No date: Diabetes mellitus without complication (HCC) No date: Hypertension   Reproductive/Obstetrics                             Anesthesia Physical Anesthesia Plan  ASA: II  Anesthesia Plan: General   Post-op Pain Management:    Induction: Intravenous  PONV Risk Score and Plan: 2 and Ondansetron and Dexamethasone  Airway Management Planned: LMA  Additional Equipment:   Intra-op Plan:   Post-operative Plan:   Informed Consent: I have reviewed the patients History and Physical, chart, labs and discussed the procedure including the risks, benefits and alternatives for the proposed anesthesia with the patient or authorized  representative who has indicated his/her understanding and acceptance.     Dental advisory given  Plan Discussed with: CRNA and Anesthesiologist  Anesthesia Plan Comments:         Anesthesia Quick Evaluation

## 2020-05-05 ENCOUNTER — Encounter: Payer: Self-pay | Admitting: Podiatry

## 2020-05-05 LAB — SURGICAL PATHOLOGY

## 2020-05-07 LAB — AEROBIC/ANAEROBIC CULTURE W GRAM STAIN (SURGICAL/DEEP WOUND): Gram Stain: NONE SEEN

## 2020-08-19 ENCOUNTER — Other Ambulatory Visit: Payer: Self-pay | Admitting: Internal Medicine

## 2020-08-19 DIAGNOSIS — R7989 Other specified abnormal findings of blood chemistry: Secondary | ICD-10-CM

## 2020-08-22 ENCOUNTER — Ambulatory Visit
Admission: RE | Admit: 2020-08-22 | Discharge: 2020-08-22 | Disposition: A | Discharge status: Home / Self Care | Payer: Medicare Other | Source: Ambulatory Visit | Attending: Internal Medicine | Admitting: Internal Medicine

## 2020-08-22 ENCOUNTER — Other Ambulatory Visit: Payer: Self-pay

## 2020-08-22 DIAGNOSIS — R7989 Other specified abnormal findings of blood chemistry: Secondary | ICD-10-CM | POA: Insufficient documentation

## 2020-10-24 ENCOUNTER — Inpatient Hospital Stay: Payer: Medicare Other

## 2020-10-24 ENCOUNTER — Other Ambulatory Visit: Payer: Self-pay

## 2020-10-24 ENCOUNTER — Inpatient Hospital Stay
Admission: EM | Admit: 2020-10-24 | Discharge: 2020-10-28 | DRG: 854 | Disposition: A | Discharge status: Home Health | Payer: Medicare Other | Attending: Obstetrics and Gynecology | Admitting: Obstetrics and Gynecology

## 2020-10-24 ENCOUNTER — Other Ambulatory Visit
Admission: RE | Admit: 2020-10-24 | Discharge: 2020-10-24 | Disposition: A | Discharge status: Home / Self Care | Payer: Medicare Other | Source: Ambulatory Visit | Attending: Podiatry | Admitting: Podiatry

## 2020-10-24 DIAGNOSIS — Z79899 Other long term (current) drug therapy: Secondary | ICD-10-CM

## 2020-10-24 DIAGNOSIS — E876 Hypokalemia: Secondary | ICD-10-CM | POA: Diagnosis not present

## 2020-10-24 DIAGNOSIS — M84477A Pathological fracture, right toe(s), initial encounter for fracture: Secondary | ICD-10-CM | POA: Diagnosis present

## 2020-10-24 DIAGNOSIS — K5900 Constipation, unspecified: Secondary | ICD-10-CM | POA: Diagnosis not present

## 2020-10-24 DIAGNOSIS — I10 Essential (primary) hypertension: Secondary | ICD-10-CM | POA: Diagnosis present

## 2020-10-24 DIAGNOSIS — N179 Acute kidney failure, unspecified: Secondary | ICD-10-CM | POA: Diagnosis present

## 2020-10-24 DIAGNOSIS — E1140 Type 2 diabetes mellitus with diabetic neuropathy, unspecified: Secondary | ICD-10-CM | POA: Diagnosis present

## 2020-10-24 DIAGNOSIS — A419 Sepsis, unspecified organism: Secondary | ICD-10-CM | POA: Diagnosis present

## 2020-10-24 DIAGNOSIS — Z888 Allergy status to other drugs, medicaments and biological substances status: Secondary | ICD-10-CM

## 2020-10-24 DIAGNOSIS — M869 Osteomyelitis, unspecified: Secondary | ICD-10-CM | POA: Diagnosis present

## 2020-10-24 DIAGNOSIS — Z7984 Long term (current) use of oral hypoglycemic drugs: Secondary | ICD-10-CM

## 2020-10-24 DIAGNOSIS — Z20822 Contact with and (suspected) exposure to covid-19: Secondary | ICD-10-CM | POA: Diagnosis present

## 2020-10-24 DIAGNOSIS — M86171 Other acute osteomyelitis, right ankle and foot: Secondary | ICD-10-CM | POA: Diagnosis present

## 2020-10-24 DIAGNOSIS — L089 Local infection of the skin and subcutaneous tissue, unspecified: Secondary | ICD-10-CM | POA: Diagnosis present

## 2020-10-24 DIAGNOSIS — L03115 Cellulitis of right lower limb: Secondary | ICD-10-CM | POA: Insufficient documentation

## 2020-10-24 DIAGNOSIS — E1169 Type 2 diabetes mellitus with other specified complication: Secondary | ICD-10-CM | POA: Diagnosis present

## 2020-10-24 DIAGNOSIS — E875 Hyperkalemia: Secondary | ICD-10-CM | POA: Diagnosis not present

## 2020-10-24 DIAGNOSIS — R6520 Severe sepsis without septic shock: Secondary | ICD-10-CM | POA: Diagnosis present

## 2020-10-24 DIAGNOSIS — Z8249 Family history of ischemic heart disease and other diseases of the circulatory system: Secondary | ICD-10-CM

## 2020-10-24 DIAGNOSIS — Z853 Personal history of malignant neoplasm of breast: Secondary | ICD-10-CM | POA: Diagnosis not present

## 2020-10-24 DIAGNOSIS — Z880 Allergy status to penicillin: Secondary | ICD-10-CM | POA: Diagnosis not present

## 2020-10-24 DIAGNOSIS — Z881 Allergy status to other antibiotic agents status: Secondary | ICD-10-CM

## 2020-10-24 DIAGNOSIS — M79674 Pain in right toe(s): Secondary | ICD-10-CM | POA: Diagnosis present

## 2020-10-24 LAB — CBC WITH DIFFERENTIAL/PLATELET
Abs Immature Granulocytes: 0.12 10*3/uL — ABNORMAL HIGH (ref 0.00–0.07)
Basophils Absolute: 0.1 10*3/uL (ref 0.0–0.1)
Basophils Relative: 0 %
Eosinophils Absolute: 0.3 10*3/uL (ref 0.0–0.5)
Eosinophils Relative: 2 %
HCT: 42.4 % (ref 36.0–46.0)
Hemoglobin: 14 g/dL (ref 12.0–15.0)
Immature Granulocytes: 1 %
Lymphocytes Relative: 7 %
Lymphs Abs: 1.3 10*3/uL (ref 0.7–4.0)
MCH: 29.8 pg (ref 26.0–34.0)
MCHC: 33 g/dL (ref 30.0–36.0)
MCV: 90.2 fL (ref 80.0–100.0)
Monocytes Absolute: 1.1 10*3/uL — ABNORMAL HIGH (ref 0.1–1.0)
Monocytes Relative: 5 %
Neutro Abs: 16.5 10*3/uL — ABNORMAL HIGH (ref 1.7–7.7)
Neutrophils Relative %: 85 %
Platelets: 438 10*3/uL — ABNORMAL HIGH (ref 150–400)
RBC: 4.7 MIL/uL (ref 3.87–5.11)
RDW: 13.4 % (ref 11.5–15.5)
WBC: 19.3 10*3/uL — ABNORMAL HIGH (ref 4.0–10.5)
nRBC: 0 % (ref 0.0–0.2)

## 2020-10-24 LAB — COMPREHENSIVE METABOLIC PANEL
ALT: 28 U/L (ref 0–44)
AST: 36 U/L (ref 15–41)
Albumin: 3.5 g/dL (ref 3.5–5.0)
Alkaline Phosphatase: 141 U/L — ABNORMAL HIGH (ref 38–126)
Anion gap: 17 — ABNORMAL HIGH (ref 5–15)
BUN: 32 mg/dL — ABNORMAL HIGH (ref 8–23)
CO2: 21 mmol/L — ABNORMAL LOW (ref 22–32)
Calcium: 9.4 mg/dL (ref 8.9–10.3)
Chloride: 94 mmol/L — ABNORMAL LOW (ref 98–111)
Creatinine, Ser: 2.15 mg/dL — ABNORMAL HIGH (ref 0.44–1.00)
GFR, Estimated: 23 mL/min — ABNORMAL LOW (ref >60)
Glucose, Bld: 172 mg/dL — ABNORMAL HIGH (ref 70–99)
Potassium: 3.8 mmol/L (ref 3.5–5.1)
Sodium: 132 mmol/L — ABNORMAL LOW (ref 135–145)
Total Bilirubin: 1.3 mg/dL — ABNORMAL HIGH (ref 0.3–1.2)
Total Protein: 8.1 g/dL (ref 6.5–8.1)

## 2020-10-24 LAB — LACTIC ACID, PLASMA
Lactic Acid, Venous: 6.1 mmol/L (ref 0.5–1.9)
Lactic Acid, Venous: 5.3 mmol/L (ref 0.5–1.9)
Lactic Acid, Venous: 4.6 mmol/L (ref 0.5–1.9)

## 2020-10-24 LAB — GLUCOSE, CAPILLARY: Glucose-Capillary: 145 mg/dL — ABNORMAL HIGH (ref 70–99)

## 2020-10-24 LAB — RESP PANEL BY RT-PCR (FLU A&B, COVID) ARPGX2
Influenza A by PCR: NEGATIVE
Influenza B by PCR: NEGATIVE
SARS Coronavirus 2 by RT PCR: NEGATIVE

## 2020-10-24 LAB — HEMOGLOBIN A1C
Hgb A1c MFr Bld: 7 % — ABNORMAL HIGH (ref 4.8–5.6)
Mean Plasma Glucose: 154.2 mg/dL

## 2020-10-24 LAB — CREATININE, SERUM
Creatinine, Ser: 2.15 mg/dL — ABNORMAL HIGH (ref 0.44–1.00)
GFR, Estimated: 23 mL/min — ABNORMAL LOW (ref >60)

## 2020-10-24 LAB — TSH: TSH: 1.352 u[IU]/mL (ref 0.350–4.500)

## 2020-10-24 MED ORDER — VANCOMYCIN HCL IN DEXTROSE 1-5 GM/200ML-% IV SOLN: 1000.0000 mg | Freq: Once | INTRAVENOUS | Status: DC

## 2020-10-24 MED ORDER — SODIUM CHLORIDE 0.9 % IV BOLUS (SEPSIS)
500.0000 mL | Freq: Once | INTRAVENOUS | Status: AC
  Administered 2020-10-24: 500 mL via INTRAVENOUS

## 2020-10-24 MED ORDER — SENNOSIDES-DOCUSATE SODIUM 8.6-50 MG PO TABS: 1.0000 | ORAL_TABLET | Freq: Every evening | ORAL | Status: DC | PRN

## 2020-10-24 MED ORDER — SODIUM CHLORIDE 0.9 % IV SOLN
2.0000 g | INTRAVENOUS | Status: DC
  Administered 2020-10-25 – 2020-10-26 (×2): 2 g via INTRAVENOUS
  Filled 2020-10-24: qty 20
  Filled 2020-10-24 (×2): qty 2

## 2020-10-24 MED ORDER — ACETAMINOPHEN 650 MG RE SUPP: 650.0000 mg | Freq: Four times a day (QID) | RECTAL | Status: DC | PRN

## 2020-10-24 MED ORDER — MORPHINE SULFATE (PF) 2 MG/ML IV SOLN: 2.0000 mg | INTRAVENOUS | Status: DC | PRN

## 2020-10-24 MED ORDER — SODIUM CHLORIDE 0.9 % IV SOLN
2.0000 g | Freq: Once | INTRAVENOUS | Status: AC
  Administered 2020-10-24: 2 g via INTRAVENOUS
  Filled 2020-10-24: qty 20

## 2020-10-24 MED ORDER — HEPARIN SODIUM (PORCINE) 5000 UNIT/ML IJ SOLN
5000.0000 [IU] | Freq: Three times a day (TID) | INTRAMUSCULAR | Status: DC
  Administered 2020-10-24 – 2020-10-27 (×8): 5000 [IU] via SUBCUTANEOUS
  Filled 2020-10-24 (×8): qty 1

## 2020-10-24 MED ORDER — HYDROCODONE-ACETAMINOPHEN 5-325 MG PO TABS
1.0000 | ORAL_TABLET | ORAL | Status: DC | PRN
  Filled 2020-10-24: qty 1

## 2020-10-24 MED ORDER — LACTATED RINGERS IV SOLN: INTRAVENOUS | Status: AC

## 2020-10-24 MED ORDER — VANCOMYCIN HCL 750 MG/150ML IV SOLN
750.0000 mg | Freq: Once | INTRAVENOUS | Status: AC
  Administered 2020-10-24: 750 mg via INTRAVENOUS
  Filled 2020-10-24: qty 150

## 2020-10-24 MED ORDER — HYDRALAZINE HCL 20 MG/ML IJ SOLN: 10.0000 mg | Freq: Three times a day (TID) | INTRAMUSCULAR | Status: DC | PRN

## 2020-10-24 MED ORDER — ONDANSETRON HCL 4 MG PO TABS: 4.0000 mg | ORAL_TABLET | Freq: Four times a day (QID) | ORAL | Status: DC | PRN

## 2020-10-24 MED ORDER — SODIUM CHLORIDE 0.9 % IV BOLUS (SEPSIS)
1000.0000 mL | Freq: Once | INTRAVENOUS | Status: AC
  Administered 2020-10-24: 1000 mL via INTRAVENOUS

## 2020-10-24 MED ORDER — VANCOMYCIN HCL IN DEXTROSE 1-5 GM/200ML-% IV SOLN
1000.0000 mg | Freq: Once | INTRAVENOUS | Status: AC
  Administered 2020-10-24: 1000 mg via INTRAVENOUS
  Filled 2020-10-24: qty 200

## 2020-10-24 MED ORDER — INSULIN ASPART 100 UNIT/ML ~~LOC~~ SOLN
2.0000 [IU] | Freq: Three times a day (TID) | SUBCUTANEOUS | Status: DC
  Administered 2020-10-26 – 2020-10-28 (×7): 2 [IU] via SUBCUTANEOUS
  Filled 2020-10-24 (×8): qty 1

## 2020-10-24 MED ORDER — ACETAMINOPHEN 325 MG PO TABS: 650.0000 mg | ORAL_TABLET | Freq: Four times a day (QID) | ORAL | Status: DC | PRN

## 2020-10-24 MED ORDER — ONDANSETRON HCL 4 MG/2ML IJ SOLN: 4.0000 mg | Freq: Four times a day (QID) | INTRAMUSCULAR | Status: DC | PRN

## 2020-10-24 MED ORDER — INSULIN ASPART 100 UNIT/ML ~~LOC~~ SOLN
0.0000 [IU] | Freq: Three times a day (TID) | SUBCUTANEOUS | Status: DC
  Administered 2020-10-26: 7 [IU] via SUBCUTANEOUS
  Administered 2020-10-27 – 2020-10-28 (×4): 1 [IU] via SUBCUTANEOUS
  Filled 2020-10-24 (×6): qty 1

## 2020-10-24 MED ORDER — ALBUTEROL SULFATE (2.5 MG/3ML) 0.083% IN NEBU: 2.5000 mg | INHALATION_SOLUTION | RESPIRATORY_TRACT | Status: DC | PRN

## 2020-10-24 NOTE — ED Notes (Signed)
Patient reports she was sent by her PCP for "surgery" on her right big toe. Patient reports diabetic wound to right big toe x several weeks. No drainage noted. Dressing placed at PCP removed for eval.

## 2020-10-24 NOTE — ED Notes (Signed)
ED Provider at bedside. 

## 2020-10-24 NOTE — ED Notes (Addendum)
Lab contacted to add on lab specimens that inpatient MD ordered r/t patient being a difficult stick/IV team access patient.

## 2020-10-24 NOTE — ED Provider Notes (Signed)
[facility] Emergency Department Provider Note  ____________________________________________   Event Date/Time   First MD Initiated Contact with Patient 10/24/20 1546     (approximate)  I have reviewed the triage vital signs and the nursing notes.   HISTORY  Chief Complaint Wound Infection    HPI Joann [name] is a 75 y.o. female with prior breast cancer, diabetes, hypertension who comes in for infection to the right foot big toe.  Patient states about a week ago there was a blister on the bottom of her right foot that burst and liquid came out.  Patient states that she went to podiatry today and they noted that the foot looked much worse than normal.  There is redness and warmth noted to it.  There is some pain constant, worse with walking, better at rest.  Patient reports having an x-ray done in clinic that showed that that there was an infection in the bone.  On review of records patient was seen by podiatry today by Dr. Luana Shu.     Past Medical History:  Diagnosis Date  . Breast cancer ([place]) 2007   right breast lumpectomy with 36 rad tx  . Breast mass 11/23/2016   6 o'clock  . Diabetes mellitus without complication ([place])   . Hypertension     Patient Active Problem List   Diagnosis Date Noted  . 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 Cellulitis 10/15/2016    Past Surgical History:  Procedure Laterality Date  . AMPUTATION Left 02/23/2019   Procedure: AMPUTATION LEFT 2nd Toe;  Surgeon: Samara Deist, DPM;  Location: [facility] ORS;  Service: Podiatry;  Laterality: Left;  . BONE EXCISION Left 05/02/2020   Procedure: BONE EXCISION;  Surgeon: Samara Deist, DPM;  Location: [facility] ORS;  Service: Podiatry;  Laterality: Left;  . BREAST BIOPSY Right 2007   positive.  Lumpectomy with rad tx  . BREAST LUMPECTOMY Right 2007  . [facility] APPLICATION Left 38/01/5642   Procedure: ADJACENT SKIN TRANSFER;  Surgeon: Samara Deist, DPM;  Location:  [facility] ORS;  Service: Podiatry;  Laterality: Left;  . INCISION AND DRAINAGE Left 10/16/2016   Procedure: INCISION AND DRAINAGE;  Surgeon: Albertine Patricia, DPM;  Location: [facility] ORS;  Service: Podiatry;  Laterality: Left;  . IRRIGATION AND DEBRIDEMENT FOOT Left 03/02/2019   Procedure: IRRIGATION AND DEBRIDEMENT LEFT FOOT SECOND METATARSAL, DIABETIC;  Surgeon: Samara Deist, DPM;  Location: [facility] ORS;  Service: Podiatry;  Laterality: Left;  . METATARSAL HEAD EXCISION Left 05/02/2020   Procedure: METATARSAL HEAD EXCISION 5th;  Surgeon: Samara Deist, DPM;  Location: [facility] ORS;  Service: Podiatry;  Laterality: Left;    Prior to Admission medications   Medication Sig Start Date End Date Taking? Authorizing Provider  aspirin EC 81 MG tablet Take 81 mg by mouth daily as needed (headaches).     [provider]  Calcium Citrate-Vitamin D (CALCIUM + D PO) Take 1 tablet by mouth daily.    [provider]  hydrochlorothiazide (HYDRODIURIL) 12.5 MG tablet Take 12.5 mg by mouth every evening. 10/12/16   [provider]  HYDROcodone-acetaminophen (NORCO) 5-325 MG tablet Take 1 tablet by mouth every 4 (four) hours as needed for moderate pain. 05/02/20   Samara Deist, DPM  lisinopril (PRINIVIL,ZESTRIL) 10 MG tablet Take 10 mg by mouth every morning.  07/30/16   [provider]  metFORMIN (GLUCOPHAGE) 500 MG tablet Take 500 mg by mouth 2 (two) times daily. 09/30/16   [provider]    Allergies  Neosporin [neomycin-bacitracin zn-polymyx], Penicillins, and Statins  Family History  Problem Relation Age of Onset  . Dementia Mother   . CAD Mother   . CAD Father   . Deep vein thrombosis Father   . Breast cancer Neg Hx     Social History Social History   Tobacco Use  . Smoking status: Never Smoker  . Smokeless tobacco: Never Used  Vaping Use  . Vaping Use: Never used  Substance Use Topics  . Alcohol use: No  . Drug use: No      Review of  Systems Constitutional: No fever/chills Eyes: No visual changes. ENT: No sore throat. Cardiovascular: Denies chest pain. Respiratory: Denies shortness of breath. Gastrointestinal: No abdominal pain.  No nausea, no vomiting.  No diarrhea.  No constipation. Genitourinary: Negative for dysuria. Musculoskeletal: Negative for back pain.  Positive toe pain Skin: Negative for rash. Neurological: Negative for headaches, focal weakness or numbness. All other ROS negative ____________________________________________   PHYSICAL EXAM:  VITAL SIGNS: ED Triage Vitals [10/24/20 1457]  Enc Vitals Group     BP (!) 134/57     Pulse Rate (!) 108     Resp 20     Temp 98.3 F (36.8 C)     Temp Source Oral     SpO2 97 %     Weight 173 lb (78.5 kg)     Height 5\' 2"  (1.575 m)     Head Circumference      Peak Flow      Pain Score 3     Pain Loc      Pain Edu?      Excl. in [place]?     Constitutional: Alert and oriented. Well appearing and in no acute distress. Eyes: Conjunctivae are normal. EOMI. Head: Atraumatic. Nose: No congestion/rhinnorhea. Mouth/Throat: Mucous membranes are moist.   Neck: No stridor. Trachea Midline. FROM Cardiovascular: Normal rate, regular rhythm. Grossly normal heart sounds.  Good peripheral circulation. Respiratory: Normal respiratory effort.  No retractions. Lungs CTAB. Gastrointestinal: Soft and nontender. No distention. No abdominal bruits.  Musculoskeletal: Ulcer noted to the right toe with redness and warmth spreading up into the forefoot.  No issue of the ankle.  2+ distal pulse Neurologic:  Normal speech and language. No gross focal neurologic deficits are appreciated.  Skin:  Skin is warm, dry and intact. No rash noted. Psychiatric: Mood and affect are normal. Speech and behavior are normal. GU: Deferred   ____________________________________________   LABS (all labs ordered are listed, but only abnormal results are displayed)  Labs Reviewed  LACTIC  ACID, PLASMA - Abnormal; Notable for the following components:      Result Value   Lactic Acid, Venous 6.1 (*)    All other components within normal limits  CBC WITH DIFFERENTIAL/PLATELET - Abnormal; Notable for the following components:   WBC 19.3 (*)    Platelets 438 (*)    Neutro Abs 16.5 (*)    Monocytes Absolute 1.1 (*)    Abs Immature Granulocytes 0.12 (*)    All other components within normal limits  RESP PANEL BY RT-PCR (FLU A&B, COVID) ARPGX2  LACTIC ACID, PLASMA  URINALYSIS, COMPLETE (UACMP) WITH MICROSCOPIC  COMPREHENSIVE METABOLIC PANEL   ____________________________________________   ED ECG REPORT I, Vanessa [place], the attending physician, personally viewed and interpreted this ECG.  EKG is sinus rate of 90, no ST elevation, T wave version lead III, normal intervals, some artifact noted ____________________________________________  RADIOLOGY ____________pending MRI ________________________________   PROCEDURES  Procedure(s) performed (including Critical Care):  .Critical Care Performed by: Vanessa [place], MD Authorized by: Vanessa [place], MD   Critical care provider statement:    Critical care time (minutes):  45   Critical care was necessary to treat or prevent imminent or life-threatening deterioration of the following conditions:  Sepsis   Critical care was time spent personally by me on the following activities:  Discussions with consultants, evaluation of patient's response to treatment, examination of patient, ordering and performing treatments and interventions, ordering and review of laboratory studies, ordering and review of radiographic studies, pulse oximetry, re-evaluation of patient's condition, obtaining history from patient or surrogate and review of old charts .1-3 Lead EKG Interpretation Performed by: Vanessa [place], MD Authorized by: Vanessa [place], MD     Interpretation: normal     ECG rate:  60s   ECG rate assessment: normal     Rhythm:  sinus rhythm     Ectopy: none     Conduction: normal       ____________________________________________   INITIAL IMPRESSION / ASSESSMENT AND PLAN / ED COURSE  Joann [name] was evaluated in Emergency Department on 10/24/2020 for the symptoms described in the history of present illness. She was evaluated in the context of the global COVID-19 pandemic, which necessitated consideration that the patient might be at risk for infection with the SARS-CoV-2 virus that causes COVID-19. Institutional protocols and algorithms that pertain to the evaluation of patients at risk for COVID-19 are in a state of rapid change based on information released by regulatory bodies including the CDC and federal and state organizations. These policies and algorithms were followed during the patient's care in the ED.    Patient is a 75 year old who comes in with right foot infection most concerning for osteo versus cellulitis.  No obvious abscess upon examination.  Lower suspicion for necrotizing fasciitis.  Upon patient getting to the room it was noted that her white count was significantly elevated lactate was over 6.  I immediately called a sepsis alert and started full fluid resuscitation at 30 cc/kg and started on broad-spectrum antibiotics of vancomycin and ceftriaxone.  I did alert the podiatry team Dr. Vickki Muff so they are aware of how sick patient was.  He recommended we get an MRI foot noncontrast to further evaluate the extent of the osteo-.  6:00 PM on repeat evaluation lactate is coming down with fluids and she still has additional fluid that she is getting.  Patient is not hypotensive at this time does not need pressors.  Will discuss with the hospital team for admission.    ____________________________________________   FINAL CLINICAL IMPRESSION(S) / ED DIAGNOSES   Final diagnoses:  Sepsis, due to unspecified organism, unspecified whether acute organ dysfunction present ([place])  Osteomyelitis of  right foot, unspecified type ([place])      MEDICATIONS GIVEN DURING THIS VISIT:  Medications  sodium chloride 0.9 % bolus 1,000 mL (1,000 mLs Intravenous New Bag/Given 10/24/20 1635)    And  sodium chloride 0.9 % bolus 1,000 mL (has no administration in time range)    And  sodium chloride 0.9 % bolus 500 mL (has no administration in time range)  vancomycin (VANCOCIN) IVPB 1000 mg/200 mL premix (has no administration in time range)    Followed by  vancomycin (VANCOREADY) IVPB 750 mg/150 mL (has no administration in time range)  cefTRIAXone (ROCEPHIN) 2 g in sodium chloride 0.9 % 100 mL IVPB (2 g Intravenous New Bag/Given 10/24/20 1638)  ED Discharge Orders    None       Note:  This document was prepared using Dragon voice recognition software and may include unintentional dictation errors.   Vanessa [place], MD 10/24/20 1800

## 2020-10-24 NOTE — ED Notes (Signed)
Patient to MRI. Belongings given to husband at bedside.

## 2020-10-24 NOTE — ED Notes (Signed)
Patient and husband at bedside updated on POC. Husband notified of room number and visitation hours. Handoff given to Paulina, RN, patient pending transport to MRI.

## 2020-10-24 NOTE — ED Notes (Signed)
Dr Jari Pigg aware of starting antibiotics before all cultures obtained d/t difficult stick and multiple attempts

## 2020-10-24 NOTE — Progress Notes (Signed)
Elink following this Code Sepsis. 

## 2020-10-24 NOTE — Consult Note (Signed)
Brief consult note Patient admitted with worsening right great toe redness swelling drainage and pain.  Has a history of neuropathy.  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  Well-known to my service.  Seen in the outpatient clinic today by podiatry.  X-ray showed pathological fracture of the distal great toe with erosive changes consistent with osteomyelitis.  Diffuse cellulitis was noted.  Patient sent to the ER.  She states she has been having fever and chills for the last week.  Patient to be admitted to the medical service.  I recommended an MRI to be performed.  Antibiotics have been instituted at this time.  Will reevaluate tomorrow.  We will likely perform surgery or amputation on Sunday once medically stabilized.

## 2020-10-24 NOTE — ED Notes (Signed)
Phelbotomy contacted to recollect CMP and repeat lactic due. INT attempt x2 by this RN without blood collection.

## 2020-10-24 NOTE — ED Notes (Signed)
Husband at bedside.  

## 2020-10-24 NOTE — ED Notes (Signed)
All outstanding blood work sent by lab/phlebotomist at bedside.

## 2020-10-24 NOTE — ED Notes (Signed)
MRI to pick up patient around 8pm. Patient notified.

## 2020-10-24 NOTE — H&P (Addendum)
History and Physical    Joann [name] YWV:[id number] DOB: [birth year] DOA: 10/24/2020  PCP: Baxter Hire, MD  Patient coming from: podiatry office / lives at home  I have personally briefly reviewed patient's old medical records in [place]  Chief Complaint: wound infection /osteomylitis  HPI: Joann [name] is a 75 y.o. female with medical history significant of breast cancer, diabetes, hypertension who has had history of ruptured blister on plantar aspect of her right foot  X 1 week. She noted this am that he toe was swollen and red and so she followed up with podiatry am of presentation. On evaluation she was found to have redness/warmth/drainage/swelling and pain of right great toe. In the office she had xray that showed pathological fracture of the distal great toe with erosive changes consistent with osteomyelitis and due to this she was referred to the ed. Patient notes she has had associated chills but noted no fever. She denies n/v/d/abdominal pain/ sob/ chest pain, new cough or uri symptoms.  ED Course:  Vitals: 98.3, bp 134/57, hr 108, rr 20  Sat 97% on ra  Labs: wbc 19.3, hbg 14, plt 438,   Lactic 6.1 NA 132, K3.8, C02: 21, cr 2.15 up from prior of 0.86 MRI: pending  tx with vanc/ctx  Patient was seen by Dr Vickki Muff in ED who notes tenative plan for OR time 4/3  Review of Systems: As per HPI otherwise 10 point review of systems negative.   Past Medical History:  Diagnosis Date  . Breast cancer ([place]) 2007   right breast lumpectomy with 36 rad tx  . Breast mass 11/23/2016   6 o'clock  . Diabetes mellitus without complication ([place])   . Hypertension     Past Surgical History:  Procedure Laterality Date  . AMPUTATION Left 02/23/2019   Procedure: AMPUTATION LEFT 2nd Toe;  Surgeon: Samara Deist, DPM;  Location: [facility] ORS;  Service: Podiatry;  Laterality: Left;  . BONE EXCISION Left 05/02/2020   Procedure: BONE EXCISION;  Surgeon: Samara Deist, DPM;   Location: [facility] OR[facility];  Service: Podiatry;  Laterality: Left;  . BREAST BIOPSY Right 2007   positive.  Lumpectomy with rad tx  . BREAST LUMPECTOMY Right 2007  . [facility] APPLICATION Left 85/10/6268   Procedure: ADJACENT SKIN TRANSFER;  Surgeon: Samara Deist, DPM;  Location: [facility] ORS;  Service: Podiatry;  Laterality: Left;  . INCISION AND DRAINAGE Left 10/16/2016   Procedure: INCISION AND DRAINAGE;  Surgeon: Albertine Patricia, DPM;  Location: [facility] OR[facility];  Service: Podiatry;  Laterality: Left;  . IRRIGATION AND DEBRIDEMENT FOOT Left 03/02/2019   Procedure: IRRIGATION AND DEBRIDEMENT LEFT FOOT SECOND METATARSAL, DIABETIC;  Surgeon: Samara Deist, DPM;  Location: [facility] OR[facility];  Service: Podiatry;  Laterality: Left;  . METATARSAL HEAD EXCISION Left 05/02/2020   Procedure: METATARSAL HEAD EXCISION 5th;  Surgeon: Samara Deist, DPM;  Location: [facility] OR[facility];  Service: Podiatry;  Laterality: Left;     reports that she has never smoked. She has never used smokeless tobacco. She reports that she does not drink alcohol and does not use drugs.  Allergies  Allergen Reactions  . Neosporin [Neomycin-Bacitracin Zn-Polymyx] Itching  . Penicillins Itching    Has patient had a PCN reaction causing immediate rash, facial/tongue/throat swelling, SOB or lightheadedness with hypotension: no Has patient had a PCN reaction causing severe rash involving mucus membranes or skin necrosis: no Has patient had a PCN reaction that required hospitalization no Has patient had a PCN reaction occurring within the last  10 years:no If all of the above answers are "NO", then may proceed with Cephalosporin use.   . Statins     hallucinations    Family History  Problem Relation Age of Onset  . Dementia Mother   . CAD Mother   . CAD Father   . Deep vein thrombosis Father   . Breast cancer Neg Hx     Prior to Admission medications   Medication Sig Start Date End Date Taking? Authorizing Provider  aspirin EC 81 MG tablet Take 81 mg  by mouth daily as needed (headaches).     [provider]  Calcium Citrate-Vitamin D (CALCIUM + D PO) Take 1 tablet by mouth daily.    [provider]  hydrochlorothiazide (HYDRODIURIL) 12.5 MG tablet Take 12.5 mg by mouth every evening. 10/12/16   [provider]  HYDROcodone-acetaminophen (NORCO) 5-325 MG tablet Take 1 tablet by mouth every 4 (four) hours as needed for moderate pain. 05/02/20   Samara Deist, DPM  lisinopril (PRINIVIL,ZESTRIL) 10 MG tablet Take 10 mg by mouth every morning.  07/30/16   [provider]  metFORMIN (GLUCOPHAGE) 500 MG tablet Take 500 mg by mouth 2 (two) times daily. 09/30/16   [provider]    Physical Exam: Vitals:   10/24/20 1457 10/24/20 1630 10/24/20 1700 10/24/20 1730  BP: (!) 134/57 126/78 121/62 (!) 142/59  Pulse: (!) 108 92 88 91  Resp: 20 (!) 24 (!) 21 17  Temp: 98.3 F (36.8 C)     TempSrc: Oral     SpO2: 97% 100% 100% 99%  Weight: 78.5 kg     Height: 5' 2" (1.575 m)        Vitals:   10/24/20 1457 10/24/20 1630 10/24/20 1700 10/24/20 1730  BP: (!) 134/57 126/78 121/62 (!) 142/59  Pulse: (!) 108 92 88 91  Resp: 20 (!) 24 (!) 21 17  Temp: 98.3 F (36.8 C)     TempSrc: Oral     SpO2: 97% 100% 100% 99%  Weight: 78.5 kg     Height: 5' 2" (1.575 m)     Constitutional: NAD, calm, comfortable Eyes: PERRL, lids and conjunctivae normal ENMT: Mucous membranes are moist. Posterior pharynx clear of any exudate or lesions.Normal dentition.  Neck: normal, supple, no masses, no thyromegaly Respiratory: clear to auscultation bilaterally, no wheezing, no crackles. Normal respiratory effort. No accessory muscle use.  Cardiovascular: Regular rate and rhythm, no murmurs / rubs / gallops. No extremity edema. 2+ pedal pulses. Abdomen: obese no tenderness, no masses palpated. No hepatosplenomegaly. Bowel sounds positive.  Musculoskeletal: no clubbing / cyanosis. No joint deformity upper and lower extremities. Good  ROM, no contractures. Normal muscle tone.  Skin:  + great toe ulcer with noted serous sanguinous drainage, great toe note to be swollen warm and red.  Erythema extends up fore foot.  Neurologic: CN 2-12 grossly intact. Sensation intact, DTR normal. Strength 5/5 in all 4.  Psychiatric: Normal judgment and insight. Alert and oriented x 3. Normal mood.    Labs on Admission: I have personally reviewed following labs and imaging studies  CBC: Recent Labs  Lab 10/24/20 1504  WBC 19.3*  NEUTROABS 16.5*  HGB 14.0  HCT 42.4  MCV 90.2  PLT 696*   Basic Metabolic Panel: Recent Labs  Lab 10/24/20 1659  NA 132*  K 3.8  CL 94*  CO2 21*  GLUCOSE 172*  BUN 32*  CREATININE 2.15*  CALCIUM 9.4   GFR: Estimated Creatinine Clearance:  21.6 mL/min (A) (by C-G formula based on SCr of 2.15 mg/dL (H)). Liver Function Tests: Recent Labs  Lab 10/24/20 1659  AST 36  ALT 28  ALKPHOS 141*  BILITOT 1.3*  PROT 8.1  ALBUMIN 3.5   No results for input(s): LIPASE, AMYLASE in the last 168 hours. No results for input(s): AMMONIA in the last 168 hours. Coagulation Profile: No results for input(s): INR, PROTIME in the last 168 hours. Cardiac Enzymes: No results for input(s): CKTOTAL, CKMB, CKMBINDEX, TROPONINI in the last 168 hours. BNP (last 3 results) No results for input(s): PROBNP in the last 8760 hours. HbA1C: No results for input(s): HGBA1C in the last 72 hours. CBG: No results for input(s): GLUCAP in the last 168 hours. Lipid Profile: No results for input(s): CHOL, HDL, LDLCALC, TRIG, CHOLHDL, LDLDIRECT in the last 72 hours. Thyroid Function Tests: No results for input(s): TSH, T4TOTAL, FREET4, T3FREE, THYROIDAB in the last 72 hours. Anemia Panel: No results for input(s): VITAMINB12, FOLATE, FERRITIN, TIBC, IRON, RETICCTPCT in the last 72 hours. Urine analysis: No results found for: COLORURINE, APPEARANCEUR, LABSPEC, PHURINE, GLUCOSEU, HGBUR, BILIRUBINUR, KETONESUR, PROTEINUR,  UROBILINOGEN, NITRITE, LEUKOCYTESUR  Radiological Exams on Admission: No results found.  EKG: Independently reviewed. snr poor quality repeat pending  Assessment/Plan  Complicated soft tissue infection of right foot with associated Acute osteomyelitis of right great toe -start on broad spectrum antibiotics: vanc/ctx  -f/u on crp/esr, procal,  -per podiatry plan for surgery 4/3 tenatively  Sepsis due to above -s/p goal directed ivfs in ED -noted lactic still 5.3  -continue ivfs per protocol  -continue to trend lactic acid  -f/u culture data -continue vanc /ctx  Tailor abx as able  -source control planned per podiatry on 4/3  AKI -hold hctz/lisinopril, nephrotoxic medications  -ivfs  -monitor labs  -monitor I/o  DMII:  -not insulin dependent  -repeat a1c pending -fs currenlty 172 - place on finger sticks and sliding scale   HTN -monitor of bp medications  -prn as needed  -consider adding standing hydralazine if unable to resume ace /hctz -resume bp medications as able  DVT prophylaxis: heparin Code Status: Full Family Communication: n/a Disposition Plan:patient  expected to be admitted greater than 2 midnights Consults called:  Vickki Muff , podiarty  Admission status: inpatient   Clance Boll MD [facility]  If 7PM-7AM, please contact night-coverage [email or web address] Password [facility]  10/24/2020, 6:35 PM

## 2020-10-24 NOTE — ED Triage Notes (Signed)
Pt to ED POV for infection to big toe on right foot. Pt states she was sent here for surgery.  Right great toe noted to have swelling, redness, and small black area  Pt diabetic

## 2020-10-24 NOTE — Consult Note (Addendum)
PHARMACY -  BRIEF ANTIBIOTIC NOTE   Pharmacy has received consult(s) for vancomycin from an ED provider.  The patient's profile has been reviewed for ht/wt/allergies/indication/available labs.    One time order(s) placed for  Vancomycin 1750 mg  Further antibiotics/pharmacy consults should be ordered by admitting physician if indicated.                       Thank you, Dorothe Pea, PharmD, BCPS Clinical Pharmacist  10/24/2020  5:00 PM

## 2020-10-24 NOTE — ED Notes (Signed)
IV team at bedside 

## 2020-10-24 NOTE — Consult Note (Signed)
CODE SEPSIS - PHARMACY COMMUNICATION  **Broad Spectrum Antibiotics should be administered within 1 hour of Sepsis diagnosis**  Time Code Sepsis Called/Page Received: 1622  Antibiotics Ordered: vancomycin, ceftriaxone  Time of 1st antibiotic administration: 1638  Additional action taken by pharmacy: n/a  If necessary, Name of Provider/Nurse Contacted:n/a    Dorothe Pea ,PharmD, BCPS Clinical Pharmacist  10/24/2020  4:25 PM

## 2020-10-25 DIAGNOSIS — M869 Osteomyelitis, unspecified: Secondary | ICD-10-CM | POA: Diagnosis not present

## 2020-10-25 DIAGNOSIS — A419 Sepsis, unspecified organism: Secondary | ICD-10-CM | POA: Diagnosis present

## 2020-10-25 LAB — BASIC METABOLIC PANEL
Anion gap: 8 (ref 5–15)
BUN: 27 mg/dL — ABNORMAL HIGH (ref 8–23)
CO2: 24 mmol/L (ref 22–32)
Calcium: 8.3 mg/dL — ABNORMAL LOW (ref 8.9–10.3)
Chloride: 102 mmol/L (ref 98–111)
Creatinine, Ser: 1.46 mg/dL — ABNORMAL HIGH (ref 0.44–1.00)
GFR, Estimated: 37 mL/min — ABNORMAL LOW (ref >60)
Glucose, Bld: 156 mg/dL — ABNORMAL HIGH (ref 70–99)
Potassium: 3.5 mmol/L (ref 3.5–5.1)
Sodium: 134 mmol/L — ABNORMAL LOW (ref 135–145)

## 2020-10-25 LAB — CBC
HCT: 35.4 % — ABNORMAL LOW (ref 36.0–46.0)
Hemoglobin: 11.7 g/dL — ABNORMAL LOW (ref 12.0–15.0)
MCH: 29.7 pg (ref 26.0–34.0)
MCHC: 33.1 g/dL (ref 30.0–36.0)
MCV: 89.8 fL (ref 80.0–100.0)
Platelets: 275 10*3/uL (ref 150–400)
RBC: 3.94 MIL/uL (ref 3.87–5.11)
RDW: 13.3 % (ref 11.5–15.5)
WBC: 11.7 10*3/uL — ABNORMAL HIGH (ref 4.0–10.5)
nRBC: 0 % (ref 0.0–0.2)

## 2020-10-25 LAB — PROTIME-INR
INR: 1.2 (ref 0.8–1.2)
Prothrombin Time: 15 seconds (ref 11.4–15.2)

## 2020-10-25 LAB — MRSA PCR SCREENING: MRSA by PCR: NEGATIVE

## 2020-10-25 LAB — LACTIC ACID, PLASMA: Lactic Acid, Venous: 2.4 mmol/L (ref 0.5–1.9)

## 2020-10-25 MED ORDER — POVIDONE-IODINE 10 % EX SWAB: 2.0000 "application " | Freq: Once | CUTANEOUS | Status: DC

## 2020-10-25 MED ORDER — CHLORHEXIDINE GLUCONATE 4 % EX LIQD
60.0000 mL | Freq: Once | CUTANEOUS | Status: AC
  Administered 2020-10-26: 4 via TOPICAL

## 2020-10-25 NOTE — Progress Notes (Signed)
Order Req placed for patient requesting prayer. Patient was awake with husband bedside. They shared about their faith, years of marriage and remaining hopeful in all situations. I prayed with them, let patient know someone is always available if and when needed.

## 2020-10-25 NOTE — Progress Notes (Addendum)
[facility] Progress Note  Patient: Joann [name]    WEX:[id number]  DOA: 10/24/2020     Date of Service: the patient was seen and examined on 10/25/2020  Chief Complaint  Patient presents with  . Wound Infection   Brief hospital course: Joann [name] is a 75 y.o. female with medical history significant of breast cancer, diabetes, hypertension who has had history of ruptured blister on plantar aspect of her right foot  X 1 week. She noted this am that he toe was swollen and red and so she followed up with podiatry am of presentation. On evaluation she was found to have redness/warmth/drainage/swelling and pain of right great toe. In the office she had xray that showed pathological fracture of the distal great toe with erosive changes consistent with osteomyelitis and due to this she was referred to the ed. Patient notes she has had associated chills but noted no fever. She denies n/v/d/abdominal pain/ sob/ chest pain, new cough or uri symptoms.  ED Course:  Vitals: 98.3, bp 134/57, hr 108, rr 20  Sat 97% on ra  Labs: wbc 19.3, hbg 14, plt 438,   Lactic 6.1 NA 132, K3.8, C02: 21, cr 2.15 up from prior of 0.86 MRI: pending  tx with vanc/ctx  Patient was seen by Dr Vickki Muff in ED who notes tenative plan for OR time 4/3    Assessment and Plan:  # Acute osteomyelitis of right great toe, Associated with complicated soft tissue infection. Continue  broad spectrum antibiotics: vanc/ctx  -f/u on crp/esr, procal,  -per podiatry plan for surgery 4/3 tenatively  # Sepsis due to above -s/p goal directed ivfs in ED -noted lactic acid  5.3 -4.6-2.4 trended down s/p IVF per protocol -f/u culture data -continue vanc /ctx  Tailor abx as able  -source control planned per podiatry on 4/3  # AKI -hold hctz/lisinopril, nephrotoxic medications  -ivfs  -monitor labs  -monitor I/o Cr 2.15--1.46 improving   # DMII:  -not insulin dependent  -repeat a1c pending -fs currenlty  172 - place on finger sticks and sliding scale   # HTN -monitor of bp medications  -prn as needed  -consider adding standing hydralazine if unable to resume ace /hctz -resume bp medications as able   Body mass index is 33.02 kg/m.  Interventions:       Diet: Heart healthy/carb modified DVT Prophylaxis: Subcutaneous Heparin    Advance goals of care discussion: Full code  Family Communication: family was present at bedside, at the time of interview.   The pt provided permission to discuss medical plan with the family. Opportunity was given to ask question and all questions were answered satisfactorily.   Disposition:  Pt is from Home, admitted with sepsis and osteomyelitis, still has osteomyelitis right great toe, surgical intervention pending, podiatry following, which precludes a safe discharge. Discharge to home, when cleared by podiatrist.  Subjective: No overnight issues, patient was admitted with infection in the right great toe, found to have osteomyelitis.  Pain is under control, patient denied any other active issues.   Physical Exam: General:  alert oriented to time, place, and person.  Appear in no distress, affect appropriate Eyes: PERRLA ENT: Oral Mucosa Clear, moist  Neck: no JVD,  Cardiovascular: S1 and S2 Present, no Murmur,  Respiratory: good respiratory effort, Bilateral Air entry equal and Decreased, no Crackles, no wheezes Abdomen: Bowel Sound present, Soft and no tenderness,  Skin: no rashes Extremities: mild Pedal edema, no calf tenderness, right great  toe erythema and tenderness, deep wound at the plantar surface, no visible discharge. Neurologic: without any new focal findings Gait not checked due to patient safety concerns  Vitals:   10/25/20 0020 10/25/20 0359 10/25/20 0404 10/25/20 0754  BP: (!) 104/45  112/66 102/61  Pulse: 91  88 75  Resp: $Remo'18  17 17  'tuwrn$ Temp: 98.5 F (36.9 C)  98.1 F (36.7 C) 98.2 F (36.8 C)  TempSrc:    Oral  SpO2:  95%  98% 100%  Weight:  81.9 kg    Height:        Intake/Output Summary (Last 24 hours) at 10/25/2020 1203 Last data filed at 10/25/2020 1032 Gross per 24 hour  Intake 1211.09 ml  Output 1050 ml  Net 161.09 ml   Filed Weights   10/24/20 1457 10/25/20 0359  Weight: 78.5 kg 81.9 kg    Data Reviewed: I have personally reviewed and interpreted daily labs, tele strips, imagings as discussed above. I reviewed all nursing notes, pharmacy notes, vitals, pertinent old records I have discussed plan of care as described above with RN and patient/family.  CBC: Recent Labs  Lab 10/24/20 1504 10/25/20 0407  WBC 19.3* 11.7*  NEUTROABS 16.5*  --   HGB 14.0 11.7*  HCT 42.4 35.4*  MCV 90.2 89.8  PLT 438* 270   Basic Metabolic Panel: Recent Labs  Lab 10/24/20 1504 10/24/20 1659 10/25/20 0407  NA  --  132* 134*  K  --  3.8 3.5  CL  --  94* 102  CO2  --  21* 24  GLUCOSE  --  172* 156*  BUN  --  32* 27*  CREATININE 2.15* 2.15* 1.46*  CALCIUM  --  9.4 8.3*    Studies: MR FOOT RIGHT WO CONTRAST  Result Date: 10/24/2020 CLINICAL DATA:  Right great toe infection. Erythema, warmth and drainage. History of diabetes and breast cancer. EXAM: MRI OF THE RIGHT FOREFOOT WITHOUT CONTRAST TECHNIQUE: Multiplanar, multisequence MR imaging of the right forefoot was performed. No intravenous contrast was administered. COMPARISON:  None. FINDINGS: Bones/Joint/Cartilage There is abnormal T2 hyperintensity throughout the marrow of the distal phalanx of the great toe. This is associated with decreased T1 signal and consistent with osteomyelitis. There is mild nonspecific T2 hyperintensity within the head of the proximal phalanx without cortical destruction or T1 signal abnormality. There are small 1st interphalangeal and metatarsal-phalangeal joint effusions. The other toes appear unremarkable. The metatarsals and visualized tarsal bones appear normal. Ligaments The Lisfranc ligament is intact. The collateral  ligaments of the metatarsophalangeal joints appear intact. Muscles and Tendons Mild fatty atrophy of the forefoot musculature. No focal fluid collection or significant tenosynovitis. Soft tissues Soft tissue swelling within the great toe with possible skin ulceration along the plantar surface. No focal fluid collection or foreign body identified. IMPRESSION: 1. Findings are consistent with osteomyelitis of the distal phalanx of the great toe. There is nonspecific mild T2 hyperintensity within the head of the proximal phalanx. 2. Soft tissue swelling in the great toe without focal fluid collection. Probable skin ulceration along the plantar surface. 3. The additional toes and metatarsals appear unremarkable. Electronically Signed   By: Richardean Sale M.D.   On: 10/24/2020 21:36    Scheduled Meds: . heparin  5,000 Units Subcutaneous Q8H  . insulin aspart  0-9 Units Subcutaneous TID WC  . insulin aspart  2 Units Subcutaneous TID WC   Continuous Infusions: . cefTRIAXone (ROCEPHIN)  IV    . lactated ringers 125 mL/hr  at 10/25/20 0516   PRN Meds: acetaminophen **OR** acetaminophen, albuterol, hydrALAZINE, HYDROcodone-acetaminophen, morphine injection, ondansetron **OR** ondansetron (ZOFRAN) IV, senna-docusate  Time spent: 35 minutes  Author: Val Riles. MD [facility] 10/25/2020 12:03 PM  To reach On-call, see care teams to locate the attending and reach out to them via [email or web address]. If 7PM-7AM, please contact night-coverage If you still have difficulty reaching the attending provider, please page the [facility] (Director on Call) for [facility] on amion for assistance.

## 2020-10-25 NOTE — Consult Note (Signed)
ORTHOPAEDIC CONSULTATION  REQUESTING PHYSICIAN: Val Riles, MD  Chief Complaint: Right great toe infection  HPI: Joann [name] is a 75 y.o. female who complains of  Redness and swelling right great toe.  Noticed some swelling this week with chills.  Noticed worsening redness and swelling yesterday am.  Seen in clinic and xrays consistent with osteomyelitis.  Admitted for IV abx and debridement.  Past Medical History:  Diagnosis Date  . Breast cancer ([place]) 2007   right breast lumpectomy with 36 rad tx  . Breast mass 11/23/2016   6 o'clock  . Diabetes mellitus without complication ([place])   . Hypertension    Past Surgical History:  Procedure Laterality Date  . AMPUTATION Left 02/23/2019   Procedure: AMPUTATION LEFT 2nd Toe;  Surgeon: Samara Deist, DPM;  Location: [facility] ORS;  Service: Podiatry;  Laterality: Left;  . BONE EXCISION Left 05/02/2020   Procedure: BONE EXCISION;  Surgeon: Samara Deist, DPM;  Location: [facility] ORS;  Service: Podiatry;  Laterality: Left;  . BREAST BIOPSY Right 2007   positive.  Lumpectomy with rad tx  . BREAST LUMPECTOMY Right 2007  . [facility] APPLICATION Left 58/0/9983   Procedure: ADJACENT SKIN TRANSFER;  Surgeon: Samara Deist, DPM;  Location: [facility] ORS;  Service: Podiatry;  Laterality: Left;  . INCISION AND DRAINAGE Left 10/16/2016   Procedure: INCISION AND DRAINAGE;  Surgeon: Albertine Patricia, DPM;  Location: [facility] ORS;  Service: Podiatry;  Laterality: Left;  . IRRIGATION AND DEBRIDEMENT FOOT Left 03/02/2019   Procedure: IRRIGATION AND DEBRIDEMENT LEFT FOOT SECOND METATARSAL, DIABETIC;  Surgeon: Samara Deist, DPM;  Location: [facility] ORS;  Service: Podiatry;  Laterality: Left;  . METATARSAL HEAD EXCISION Left 05/02/2020   Procedure: METATARSAL HEAD EXCISION 5th;  Surgeon: Samara Deist, DPM;  Location: [facility] ORS;  Service: Podiatry;  Laterality: Left;   Social History   Socioeconomic History  . Marital status: Married    Spouse name: Not on file   . Number of children: Not on file  . Years of education: Not on file  . Highest education level: Not on file  Occupational History  . Not on file  Tobacco Use  . Smoking status: Never Smoker  . Smokeless tobacco: Never Used  Vaping Use  . Vaping Use: Never used  Substance and Sexual Activity  . Alcohol use: No  . Drug use: No  . Sexual activity: Not on file  Other Topics Concern  . Not on file  Social History Narrative  . Not on file   Social Determinants of Health   Financial Resource Strain: Not on file  Food Insecurity: Not on file  Transportation Needs: Not on file  Physical Activity: Not on file  Stress: Not on file  Social Connections: Not on file   Family History  Problem Relation Age of Onset  . Dementia Mother   . CAD Mother   . CAD Father   . Deep vein thrombosis Father   . Breast cancer Neg Hx    Allergies  Allergen Reactions  . Neosporin [Neomycin-Bacitracin Zn-Polymyx] Itching  . Penicillins Itching    Has patient had a PCN reaction causing immediate rash, facial/tongue/throat swelling, SOB or lightheadedness with hypotension: no Has patient had a PCN reaction causing severe rash involving mucus membranes or skin necrosis: no Has patient had a PCN reaction that required hospitalization no Has patient had a PCN reaction occurring within the last 10 years:no If all of the above answers are "NO", then may proceed with Cephalosporin use.   Marland Kitchen  Statins     hallucinations   Prior to Admission medications   Medication Sig Start Date End Date Taking? Authorizing Provider  aspirin EC 81 MG tablet Take 81 mg by mouth daily as needed (headaches).     [provider]  Calcium Citrate-Vitamin D (CALCIUM + D PO) Take 1 tablet by mouth daily.    [provider]  hydrochlorothiazide (HYDRODIURIL) 12.5 MG tablet Take 12.5 mg by mouth every evening. 10/12/16   [provider]  HYDROcodone-acetaminophen (NORCO) 5-325 MG tablet Take 1 tablet by  mouth every 4 (four) hours as needed for moderate pain. 05/02/20   Samara Deist, DPM  lisinopril (PRINIVIL,ZESTRIL) 10 MG tablet Take 10 mg by mouth every morning.  07/30/16   [provider]  metFORMIN (GLUCOPHAGE) 500 MG tablet Take 500 mg by mouth 2 (two) times daily. 09/30/16   [provider]   MR FOOT RIGHT WO CONTRAST  Result Date: 10/24/2020 CLINICAL DATA:  Right great toe infection. Erythema, warmth and drainage. History of diabetes and breast cancer. EXAM: MRI OF THE RIGHT FOREFOOT WITHOUT CONTRAST TECHNIQUE: Multiplanar, multisequence MR imaging of the right forefoot was performed. No intravenous contrast was administered. COMPARISON:  None. FINDINGS: Bones/Joint/Cartilage There is abnormal T2 hyperintensity throughout the marrow of the distal phalanx of the great toe. This is associated with decreased T1 signal and consistent with osteomyelitis. There is mild nonspecific T2 hyperintensity within the head of the proximal phalanx without cortical destruction or T1 signal abnormality. There are small 1st interphalangeal and metatarsal-phalangeal joint effusions. The other toes appear unremarkable. The metatarsals and visualized tarsal bones appear normal. Ligaments The Lisfranc ligament is intact. The collateral ligaments of the metatarsophalangeal joints appear intact. Muscles and Tendons Mild fatty atrophy of the forefoot musculature. No focal fluid collection or significant tenosynovitis. Soft tissues Soft tissue swelling within the great toe with possible skin ulceration along the plantar surface. No focal fluid collection or foreign body identified. IMPRESSION: 1. Findings are consistent with osteomyelitis of the distal phalanx of the great toe. There is nonspecific mild T2 hyperintensity within the head of the proximal phalanx. 2. Soft tissue swelling in the great toe without focal fluid collection. Probable skin ulceration along the plantar surface. 3. The additional toes and  metatarsals appear unremarkable. Electronically Signed   By: Richardean Sale M.D.   On: 10/24/2020 21:36    Positive ROS: All other systems have been reviewed and were otherwise negative with the exception of those mentioned in the HPI and as above.  12 point ROS was performed.  Physical Exam: General: Alert and oriented.  No apparent distress.  Vascular:  Left foot:Dorsalis Pedis:  present Posterior Tibial:  present  Right foot: Dorsalis Pedis:  present Posterior Tibial:  present  Neuro:absent protective sensation  Derm:Left foot without ulcer.  Right great toe with ulcer and diffuse erythema.    Ortho/MS: Crepitence with rom of IPJ great toe. Diffuse edema to right foot.   MRI with osteo of great toe.  Culture with multiple organisms.  Assessment: Osteomyelitis right great toe. DM with neuropathy.  Plan: Will plan for surgery tomorrow am.  D/W pt r/b/a/c and consent given.  CBC and lactic acid trending downward. C/w IV abx.      Elesa Hacker, DPM Cell [phone]   10/25/2020 4:22 PM

## 2020-10-26 ENCOUNTER — Inpatient Hospital Stay: Payer: Medicare Other | Admitting: Anesthesiology

## 2020-10-26 ENCOUNTER — Encounter: Payer: Self-pay | Admitting: Internal Medicine

## 2020-10-26 ENCOUNTER — Encounter
Admission: EM | Disposition: A | Discharge status: Home Health | Payer: Self-pay | Source: Home / Self Care | Attending: Student

## 2020-10-26 DIAGNOSIS — M869 Osteomyelitis, unspecified: Secondary | ICD-10-CM | POA: Diagnosis not present

## 2020-10-26 HISTORY — PX: AMPUTATION TOE: SHX6595

## 2020-10-26 LAB — MAGNESIUM: Magnesium: 1.6 mg/dL — ABNORMAL LOW (ref 1.7–2.4)

## 2020-10-26 LAB — SEDIMENTATION RATE: Sed Rate: 55 mm/hr — ABNORMAL HIGH (ref 0–30)

## 2020-10-26 LAB — CBC
HCT: 34.9 % — ABNORMAL LOW (ref 36.0–46.0)
Hemoglobin: 11.8 g/dL — ABNORMAL LOW (ref 12.0–15.0)
MCH: 30 pg (ref 26.0–34.0)
MCHC: 33.8 g/dL (ref 30.0–36.0)
MCV: 88.8 fL (ref 80.0–100.0)
Platelets: 283 10*3/uL (ref 150–400)
RBC: 3.93 MIL/uL (ref 3.87–5.11)
RDW: 13.3 % (ref 11.5–15.5)
WBC: 6.6 10*3/uL (ref 4.0–10.5)
nRBC: 0 % (ref 0.0–0.2)

## 2020-10-26 LAB — GLUCOSE, CAPILLARY
Glucose-Capillary: 185 mg/dL — ABNORMAL HIGH (ref 70–99)
Glucose-Capillary: 259 mg/dL — ABNORMAL HIGH (ref 70–99)
Glucose-Capillary: 310 mg/dL — ABNORMAL HIGH (ref 70–99)
Glucose-Capillary: 229 mg/dL — ABNORMAL HIGH (ref 70–99)

## 2020-10-26 LAB — BASIC METABOLIC PANEL
Anion gap: 9 (ref 5–15)
BUN: 17 mg/dL (ref 8–23)
CO2: 24 mmol/L (ref 22–32)
Calcium: 8.6 mg/dL — ABNORMAL LOW (ref 8.9–10.3)
Chloride: 102 mmol/L (ref 98–111)
Creatinine, Ser: 1.05 mg/dL — ABNORMAL HIGH (ref 0.44–1.00)
GFR, Estimated: 55 mL/min — ABNORMAL LOW (ref >60)
Glucose, Bld: 133 mg/dL — ABNORMAL HIGH (ref 70–99)
Potassium: 3.4 mmol/L — ABNORMAL LOW (ref 3.5–5.1)
Sodium: 135 mmol/L (ref 135–145)

## 2020-10-26 LAB — PHOSPHORUS: Phosphorus: 2.5 mg/dL (ref 2.5–4.6)

## 2020-10-26 LAB — C-REACTIVE PROTEIN: CRP: 10.2 mg/dL — ABNORMAL HIGH (ref <1.0)

## 2020-10-26 SURGERY — AMPUTATION, TOE
Anesthesia: General | Site: Toe | Laterality: Right

## 2020-10-26 MED ORDER — LIDOCAINE HCL (CARDIAC) PF 100 MG/5ML IV SOSY
PREFILLED_SYRINGE | INTRAVENOUS | Status: DC | PRN
  Administered 2020-10-26: 60 mg via INTRATRACHEAL

## 2020-10-26 MED ORDER — PROPOFOL 500 MG/50ML IV EMUL
INTRAVENOUS | Status: AC
  Filled 2020-10-26: qty 50

## 2020-10-26 MED ORDER — PROPOFOL 10 MG/ML IV BOLUS
INTRAVENOUS | Status: DC | PRN
  Administered 2020-10-26: 40 mg via INTRAVENOUS
  Administered 2020-10-26: 20 mg via INTRAVENOUS

## 2020-10-26 MED ORDER — BUPIVACAINE HCL 0.5 % IJ SOLN
INTRAMUSCULAR | Status: DC | PRN
  Administered 2020-10-26: 10 mL

## 2020-10-26 MED ORDER — SODIUM CHLORIDE 0.9 % IV SOLN: INTRAVENOUS | Status: DC | PRN

## 2020-10-26 MED ORDER — KETOROLAC TROMETHAMINE 30 MG/ML IJ SOLN
INTRAMUSCULAR | Status: AC
  Filled 2020-10-26: qty 1

## 2020-10-26 MED ORDER — FENTANYL CITRATE (PF) 100 MCG/2ML IJ SOLN
INTRAMUSCULAR | Status: AC
  Filled 2020-10-26: qty 2

## 2020-10-26 MED ORDER — BUPIVACAINE LIPOSOME 1.3 % IJ SUSP
INTRAMUSCULAR | Status: DC | PRN
  Administered 2020-10-26: 10 mL

## 2020-10-26 MED ORDER — ONDANSETRON HCL 4 MG/2ML IJ SOLN: 4.0000 mg | Freq: Once | INTRAMUSCULAR | Status: DC | PRN

## 2020-10-26 MED ORDER — MAGNESIUM SULFATE 2 GM/50ML IV SOLN
2.0000 g | Freq: Once | INTRAVENOUS | Status: DC
  Filled 2020-10-26: qty 50

## 2020-10-26 MED ORDER — EPHEDRINE SULFATE 50 MG/ML IJ SOLN
INTRAMUSCULAR | Status: DC | PRN
  Administered 2020-10-26: 10 mg via INTRAVENOUS

## 2020-10-26 MED ORDER — ACETAMINOPHEN 10 MG/ML IV SOLN
INTRAVENOUS | Status: AC
  Filled 2020-10-26: qty 100

## 2020-10-26 MED ORDER — PROPOFOL 10 MG/ML IV BOLUS
INTRAVENOUS | Status: AC
  Filled 2020-10-26: qty 20

## 2020-10-26 MED ORDER — VANCOMYCIN HCL IN DEXTROSE 1-5 GM/200ML-% IV SOLN
1000.0000 mg | INTRAVENOUS | Status: DC
  Administered 2020-10-26: 1000 mg via INTRAVENOUS
  Filled 2020-10-26 (×2): qty 200

## 2020-10-26 MED ORDER — POTASSIUM CHLORIDE CRYS ER 20 MEQ PO TBCR
40.0000 meq | EXTENDED_RELEASE_TABLET | Freq: Once | ORAL | Status: AC
  Administered 2020-10-26: 40 meq via ORAL
  Filled 2020-10-26: qty 2

## 2020-10-26 MED ORDER — FENTANYL CITRATE (PF) 100 MCG/2ML IJ SOLN
INTRAMUSCULAR | Status: DC | PRN
  Administered 2020-10-26: 50 ug via INTRAVENOUS

## 2020-10-26 MED ORDER — FENTANYL CITRATE (PF) 100 MCG/2ML IJ SOLN: 25.0000 ug | INTRAMUSCULAR | Status: DC | PRN

## 2020-10-26 MED ORDER — ACETAMINOPHEN 10 MG/ML IV SOLN
INTRAVENOUS | Status: DC | PRN
  Administered 2020-10-26: 1000 mg via INTRAVENOUS

## 2020-10-26 MED ORDER — PROPOFOL 500 MG/50ML IV EMUL
INTRAVENOUS | Status: DC | PRN
  Administered 2020-10-26: 150 ug/kg/min via INTRAVENOUS

## 2020-10-26 MED ORDER — BUPIVACAINE LIPOSOME 1.3 % IJ SUSP
INTRAMUSCULAR | Status: AC
  Filled 2020-10-26: qty 20

## 2020-10-26 SURGICAL SUPPLY — 49 items
BLADE OSC/SAGITTAL MD 5.5X18 (BLADE) ×2 IMPLANT
BLADE SURG MINI STRL (BLADE) ×2 IMPLANT
BNDG CONFORM 2 STRL LF (GAUZE/BANDAGES/DRESSINGS) ×2 IMPLANT
BNDG CONFORM 3 STRL LF (GAUZE/BANDAGES/DRESSINGS) ×2 IMPLANT
BNDG ELASTIC 4X5.8 VLCR NS LF (GAUZE/BANDAGES/DRESSINGS) ×2 IMPLANT
BNDG ELASTIC 4X5.8 VLCR STR LF (GAUZE/BANDAGES/DRESSINGS) ×2 IMPLANT
BNDG ESMARK 4X12 TAN STRL LF (GAUZE/BANDAGES/DRESSINGS) ×2 IMPLANT
BNDG GAUZE 4.5X4.1 6PLY STRL (MISCELLANEOUS) ×2 IMPLANT
BNDG STRETCH 4X75 STRL LF (GAUZE/BANDAGES/DRESSINGS) ×2 IMPLANT
CANISTER SUCT 1200ML W/VALVE (MISCELLANEOUS) ×2 IMPLANT
CNTNR SPEC 2.5X3XGRAD LEK (MISCELLANEOUS) ×1
CONT SPEC 4OZ STER OR WHT (MISCELLANEOUS) ×1
CONTAINER SPEC 2.5X3XGRAD LEK (MISCELLANEOUS) ×1 IMPLANT
COVER WAND RF STERILE (DRAPES) ×2 IMPLANT
CUFF TOURN SGL QUICK 18X4 (TOURNIQUET CUFF) ×2 IMPLANT
DRAPE FLUOR MINI C-ARM 54X84 (DRAPES) ×2 IMPLANT
DRAPE XRAY CASSETTE 23X24 (DRAPES) ×2 IMPLANT
DURAPREP 26ML APPLICATOR (WOUND CARE) ×2 IMPLANT
ELECT CAUTERY BLADE 6.4 (BLADE) ×2 IMPLANT
ELECT REM PT RETURN 9FT ADLT (ELECTROSURGICAL) ×2
ELECTRODE REM PT RTRN 9FT ADLT (ELECTROSURGICAL) ×1 IMPLANT
GAUZE PACKING IODOFORM 1/2 (PACKING) ×2 IMPLANT
GAUZE SPONGE 4X4 12PLY STRL (GAUZE/BANDAGES/DRESSINGS) ×2 IMPLANT
GAUZE XEROFORM 1X8 LF (GAUZE/BANDAGES/DRESSINGS) ×4 IMPLANT
GLOVE SURG ENC MOIS LTX SZ7.5 (GLOVE) ×2 IMPLANT
GLOVE SURG UNDER LTX SZ8 (GLOVE) ×2 IMPLANT
GOWN STRL REUS W/ TWL XL LVL3 (GOWN DISPOSABLE) ×2 IMPLANT
GOWN STRL REUS W/TWL XL LVL3 (GOWN DISPOSABLE) ×2
IV NS IRRIG 3000ML ARTHROMATIC (IV SOLUTION) ×2 IMPLANT
KIT TURNOVER KIT A (KITS) ×2 IMPLANT
LABEL OR SOLS (LABEL) ×2 IMPLANT
MANIFOLD NEPTUNE II (INSTRUMENTS) ×2 IMPLANT
NEEDLE FILTER BLUNT 18X 1/2SAF (NEEDLE) ×1
NEEDLE FILTER BLUNT 18X1 1/2 (NEEDLE) ×1 IMPLANT
NEEDLE HYPO 25X1 1.5 SAFETY (NEEDLE) ×2 IMPLANT
NS IRRIG 500ML POUR BTL (IV SOLUTION) ×2 IMPLANT
PACK EXTREMITY ARMC (MISCELLANEOUS) ×2 IMPLANT
PAD ABD DERMACEA PRESS 5X9 (GAUZE/BANDAGES/DRESSINGS) ×2 IMPLANT
PULSAVAC PLUS IRRIG FAN TIP (DISPOSABLE) ×2
SHIELD FULL FACE ANTIFOG 7M (MISCELLANEOUS) ×2 IMPLANT
STOCKINETTE M/LG 89821 (MISCELLANEOUS) ×2 IMPLANT
STRAP SAFETY 5IN WIDE (MISCELLANEOUS) ×2 IMPLANT
SUT ETHILON 3-0 FS-10 30 BLK (SUTURE) ×2
SUT ETHILON 5-0 FS-2 18 BLK (SUTURE) ×2 IMPLANT
SUT VIC AB 4-0 FS2 27 (SUTURE) ×2 IMPLANT
SUTURE EHLN 3-0 FS-10 30 BLK (SUTURE) ×1 IMPLANT
SYR 10ML LL (SYRINGE) ×6 IMPLANT
SYR 30ML LL (SYRINGE) ×2 IMPLANT
TIP FAN IRRIG PULSAVAC PLUS (DISPOSABLE) ×1 IMPLANT

## 2020-10-26 NOTE — Transfer of Care (Signed)
Immediate Anesthesia Transfer of Care Note  Patient: Joann [name]  Procedure(s) Performed: right great toe amputation (Right Toe)  Patient Location: PACU  Anesthesia Type:General  Level of Consciousness: awake, alert  and oriented  Airway & Oxygen Therapy: Patient Spontanous Breathing and Patient connected to face mask oxygen  Post-op Assessment: Report given to RN and Post -op Vital signs reviewed and stable  Post vital signs: Reviewed and stable  Last Vitals:  Vitals Value Taken Time  BP    Temp    Pulse    Resp    SpO2      Last Pain:  Vitals:   10/26/20 0746  TempSrc: Oral  PainSc:          Complications: No complications documented.

## 2020-10-26 NOTE — Anesthesia Procedure Notes (Signed)
Date/Time: 10/26/2020 8:30 AM Performed by: Allean Found, CRNA Pre-anesthesia Checklist: Patient identified, Emergency Drugs available, Suction available, Patient being monitored and Timeout performed Patient Re-evaluated:Patient Re-evaluated prior to induction Oxygen Delivery Method: Simple face mask and Nasal cannula Placement Confirmation: positive ETCO2

## 2020-10-26 NOTE — Progress Notes (Signed)
Pt A/Ox4, maintained NPO to OR 7:45 am

## 2020-10-26 NOTE — Progress Notes (Signed)
[facility] Progress Note  Patient: Joann [name]    YTK:[id number]  DOA: 10/24/2020     Date of Service: the patient was seen and examined on 10/26/2020  Chief Complaint  Patient presents with  . Wound Infection   Brief hospital course: Laporshia Hogen Gloster is a 75 y.o. female with medical history significant of breast cancer, diabetes, hypertension who has had history of ruptured blister on plantar aspect of her right foot  X 1 week. She noted this am that he toe was swollen and red and so she followed up with podiatry am of presentation. On evaluation she was found to have redness/warmth/drainage/swelling and pain of right great toe. In the office she had xray that showed pathological fracture of the distal great toe with erosive changes consistent with osteomyelitis and due to this she was referred to the ed. Patient notes she has had associated chills but noted no fever. She denies n/v/d/abdominal pain/ sob/ chest pain, new cough or uri symptoms.  ED Course:  Vitals: 98.3, bp 134/57, hr 108, rr 20  Sat 97% on ra  Labs: wbc 19.3, hbg 14, plt 438,   Lactic 6.1 NA 132, K3.8, C02: 21, cr 2.15 up from prior of 0.86 MRI: pending  tx with vanc/ctx  Patient was seen by Dr Vickki Muff in ED who notes tenative plan for OR time 4/3    Assessment and Plan:  # Acute osteomyelitis of right great toe, Associated with complicated soft tissue infection. Continue  broad spectrum antibiotics: vanc/ctx  -f/u on crp 10.2, esr 55,  -podiatry consulted  S/p right great toe amputation done on 4/3  Follow biopsy report and decide for antibiotic duration, may not need long-term antibiotics if margins are clear Follow PT and OT eval before disposition   # Sepsis due to above -s/p goal directed ivfs in ED -noted lactic acid  5.3 -4.6-2.4 trended down s/p IVF per protocol WBC count trended down to normal Blood culture NGTD -f/u culture data -continue vanc /ctx  Tailor abx as able  -source  control, s/p right great toe amputation done by podiatry on 4/3  # AKI -hold hctz/lisinopril, nephrotoxic medications  -ivfs  -monitor labs  -monitor I/o Cr 2.15--1.46--1.05 improving   # DMII:  -not insulin dependent  -repeat a1c pending -fs currenlty 172 - place on finger sticks and sliding scale   # HTN -monitor of bp medications  -prn as needed  -consider adding standing hydralazine if unable to resume ace /hctz -resume bp medications as able  # Mild hypokalemia, potassium repleted.  # Hypomagnesemia, mag repleted.  Body mass index is 33.02 kg/m.  Interventions:       Diet: Heart healthy/carb modified DVT Prophylaxis: Subcutaneous Heparin    Advance goals of care discussion: Full code  Family Communication: family was present at bedside, at the time of interview.   The pt provided permission to discuss medical plan with the family. Opportunity was given to ask question and all questions were answered satisfactorily.   Disposition:  Pt is from Home, admitted with sepsis and osteomyelitis, s/p right great toe amputation done on 4/3, pending PT eval and biopsy report, which precludes a safe discharge. Discharge to home, when cleared by podiatrist.  Subjective: No overnight issues, pain is under control, patient was seen after right great toe amputation.  Patient tolerated procedure well.  Denied any active issues.   Physical Exam: General:  alert oriented to time, place, and person.  Appear in no distress, affect  appropriate Eyes: PERRLA ENT: Oral Mucosa Clear, moist  Neck: no JVD,  Cardiovascular: S1 and S2 Present, no Murmur,  Respiratory: good respiratory effort, Bilateral Air entry equal and Decreased, no Crackles, no wheezes Abdomen: Bowel Sound present, Soft and no tenderness,  Skin: no rashes Extremities: mild Pedal edema, no calf tenderness, right great toe s/p amputation, dressing CDI.   Neurologic: without any new focal findings Gait not  checked due to patient safety concerns  Vitals:   10/26/20 0920 10/26/20 0932 10/26/20 0953 10/26/20 1207  BP: (!) 110/50  110/61 132/70  Pulse: 85  73 76  Resp: (!) $RemoveB'22  18 17  'wBajKEjQ$ Temp:  98 F (36.7 C) 98.9 F (37.2 C) (!) 97.4 F (36.3 C)  TempSrc:   Oral Oral  SpO2: 93% 93% 93% 94%  Weight:      Height:        Intake/Output Summary (Last 24 hours) at 10/26/2020 1217 Last data filed at 10/26/2020 1053 Gross per 24 hour  Intake 631 ml  Output 705 ml  Net -74 ml   Filed Weights   10/24/20 1457 10/25/20 0359  Weight: 78.5 kg 81.9 kg    Data Reviewed: I have personally reviewed and interpreted daily labs, tele strips, imagings as discussed above. I reviewed all nursing notes, pharmacy notes, vitals, pertinent old records I have discussed plan of care as described above with RN and patient/family.  CBC: Recent Labs  Lab 10/24/20 1504 10/25/20 0407 10/26/20 0418  WBC 19.3* 11.7* 6.6  NEUTROABS 16.5*  --   --   HGB 14.0 11.7* 11.8*  HCT 42.4 35.4* 34.9*  MCV 90.2 89.8 88.8  PLT 438* 275 149   Basic Metabolic Panel: Recent Labs  Lab 10/24/20 1504 10/24/20 1659 10/25/20 0407 10/26/20 0418  NA  --  132* 134* 135  K  --  3.8 3.5 3.4*  CL  --  94* 102 102  CO2  --  21* 24 24  GLUCOSE  --  172* 156* 133*  BUN  --  32* 27* 17  CREATININE 2.15* 2.15* 1.46* 1.05*  CALCIUM  --  9.4 8.3* 8.6*  MG  --   --   --  1.6*  PHOS  --   --   --  2.5    Studies: No results found.  Scheduled Meds: . heparin  5,000 Units Subcutaneous Q8H  . insulin aspart  0-9 Units Subcutaneous TID WC  . insulin aspart  2 Units Subcutaneous TID WC   Continuous Infusions: . cefTRIAXone (ROCEPHIN)  IV 2 g (10/25/20 1718)  . magnesium sulfate bolus IVPB 2 g (10/26/20 1157)  . vancomycin 1,000 mg (10/26/20 1144)   PRN Meds: acetaminophen **OR** acetaminophen, albuterol, hydrALAZINE, HYDROcodone-acetaminophen, morphine injection, ondansetron **OR** ondansetron (ZOFRAN) IV, senna-docusate  Time  spent: 35 minutes  Author: Val Riles. MD [facility] 10/26/2020 12:17 PM  To reach On-call, see care teams to locate the attending and reach out to them via [email or web address]. If 7PM-7AM, please contact night-coverage If you still have difficulty reaching the attending provider, please page the [facility] (Director on Call) for [facility] on amion for assistance.

## 2020-10-26 NOTE — Progress Notes (Addendum)
Pt received s/p Right great toe amputation, A/Ox4, denies pain, +sensation, wiggles toes, dressing CDI. Report received from PACU RN Pamala Hurry.

## 2020-10-26 NOTE — Anesthesia Postprocedure Evaluation (Signed)
Anesthesia Post Note  Patient: Joann [name]  Procedure(s) Performed: right great toe amputation (Right Toe)  Patient location during evaluation: PACU Anesthesia Type: General Level of consciousness: awake and alert and oriented Pain management: pain level controlled Vital Signs Assessment: post-procedure vital signs reviewed and stable Respiratory status: spontaneous breathing Cardiovascular status: blood pressure returned to baseline Anesthetic complications: no   No complications documented.   Last Vitals:  Vitals:   10/26/20 0953 10/26/20 1207  BP: 110/61 132/70  Pulse: 73 76  Resp: 18 17  Temp: 37.2 C (!) 36.3 C  SpO2: 93% 94%    Last Pain:  Vitals:   10/26/20 1207  TempSrc: Oral  PainSc:                  Neal Oshea

## 2020-10-26 NOTE — Progress Notes (Signed)
Pharmacy Antibiotic Note  Joann [name] is a 75 y.o. female admitted on 10/24/2020. Patient s/p amputation right great toe at metatarsophalangeal joint 4/3. Pharmacy has been consulted for vancomycin dosing for osteomyelitis.  Plan: Last dose of vancomycin 4/1. Renal function improved. Start vancomycin 1000 mg IV q36h  Goal AUC 400-550 Expected AUC: 474 SCr used: 1.05   Height: 5\' 2"  (157.5 cm) Weight: 81.9 kg (180 lb 8.9 oz) IBW/kg (Calculated) : 50.1  Temp (24hrs), Avg:98.4 F (36.9 C), Min:97.5 F (36.4 C), Max:99.6 F (37.6 C)  Recent Labs  Lab 10/24/20 1504 10/24/20 1659 10/24/20 2156 10/25/20 0002 10/25/20 0407 10/26/20 0418  WBC 19.3*  --   --   --  11.7* 6.6  CREATININE 2.15* 2.15*  --   --  1.46* 1.05*  LATICACIDVEN 6.1* 5.3* 4.6* 2.4*  --   --     Estimated Creatinine Clearance: 45.2 mL/min (A) (by C-G formula based on SCr of 1.05 mg/dL (H)).    Allergies  Allergen Reactions  . Neosporin [Neomycin-Bacitracin Zn-Polymyx] Itching  . Penicillins Itching    Has patient had a PCN reaction causing immediate rash, facial/tongue/throat swelling, SOB or lightheadedness with hypotension: no Has patient had a PCN reaction causing severe rash involving mucus membranes or skin necrosis: no Has patient had a PCN reaction that required hospitalization no Has patient had a PCN reaction occurring within the last 10 years:no If all of the above answers are "NO", then may proceed with Cephalosporin use.   . Statins     hallucinations    Antimicrobials this admission: Vancomycin x 1 4/1, 4/3 >> Ceftriaxone 4/1 >>   Microbiology results: 4/3 Tissue digit amputation: pending 4/1 BCx: NGTD  4/1 MRSA PCR: negative  Thank you for allowing pharmacy to be a part of this patient's care.  Tawnya Crook, PharmD 10/26/2020 10:26 AM

## 2020-10-26 NOTE — Anesthesia Preprocedure Evaluation (Addendum)
Anesthesia Evaluation  Patient identified by MRN, date of birth, ID band Patient awake    Reviewed: Allergy & Precautions, H&P , NPO status , Patient's Chart, lab work & pertinent test results  History of Anesthesia Complications Negative for: history of anesthetic complications  Airway Mallampati: III  TM Distance: <3 FB Neck ROM: limited    Dental  (+) Chipped, Poor Dentition   Pulmonary neg pulmonary ROS, neg shortness of breath,           Cardiovascular Exercise Tolerance: Good hypertension, (-) angina(-) Past MI and (-) DOE      Neuro/Psych negative neurological ROS  negative psych ROS   GI/Hepatic negative GI ROS, Neg liver ROS, neg GERD  ,  Endo/Other  diabetes, Type 2  Renal/GU      Musculoskeletal   Abdominal   Peds negative pediatric ROS (+)  Hematology negative hematology ROS (+)   Anesthesia Other Findings Past Medical History: 2007: Breast cancer ([place])     Comment:  right breast lumpectomy with 36 rad tx 11/23/2016: Breast mass     Comment:  6 o'clock No date: Diabetes mellitus without complication ([place]) No date: Hypertension  Past Surgical History: 2007: BREAST BIOPSY; Right     Comment:  positive.  Lumpectomy with rad tx 2007: BREAST LUMPECTOMY; Right 10/16/2016: INCISION AND DRAINAGE; Left     Comment:  Procedure: INCISION AND DRAINAGE;  Surgeon: Albertine Patricia, DPM;  Location: [facility] ORS;  Service: Podiatry;                Laterality: Left;  BMI    Body Mass Index: 34.90 kg/m      Reproductive/Obstetrics negative OB ROS                             Anesthesia Physical  Anesthesia Plan  ASA: III  Anesthesia Plan: General   Post-op Pain Management:    Induction: Intravenous  PONV Risk Score and Plan: TIVA and Propofol infusion  Airway Management Planned: Nasal Cannula  Additional Equipment:   Intra-op Plan:   Post-operative Plan:    Informed Consent: I have reviewed the patients History and Physical, chart, labs and discussed the procedure including the risks, benefits and alternatives for the proposed anesthesia with the patient or authorized representative who has indicated his/her understanding and acceptance.   Patient has DNR.  Discussed DNR with patient and Suspend DNR.   Dental Advisory Given  Plan Discussed with: Anesthesiologist, CRNA and Surgeon  Anesthesia Plan Comments: (Patient consented for risks of anesthesia including but not limited to:  - adverse reactions to medications - damage to teeth, lips or other oral mucosa - sore throat or hoarseness - Damage to heart, brain, lungs or loss of life  Patient voiced understanding. TIVA acceptable by patient per surgeon.)       Anesthesia Quick Evaluation

## 2020-10-26 NOTE — Op Note (Signed)
Operative note   Surgeon:Retta Pitcher Lawyer: None    Preop diagnosis: Osteomyelitis right great toe    Postop diagnosis: Same    Procedure: Amputation right great toe at metatarsophalangeal joint    EBL: Minimal    Anesthesia:local and IV sedation.  Local consisted of 10 cc of 0.5% bupivacaine and 10 cc of Exparel long-acting anesthetic    Hemostasis: Ankle tourniquet inflated to 200 mmHg for 5 minutes    Specimen: Amputated great toe at metatarsophalangeal joint.  Bone sent for culture    Complications: None    Operative indications:Joann [name] is an 75 y.o. that presents today for surgical intervention.  The risks/benefits/alternatives/complications have been discussed and consent has been given.    Procedure:  Patient was brought into the OR and placed on the operating table in thesupine position. After anesthesia was obtained theright lower extremity was prepped and draped in usual sterile fashion.  Attention was directed to the right great toe where 2 semielliptical incisions were performed.  Full-thickness flaps were created.  Incision was carried back proximal to the metatarsophalangeal joint.  The toe was then disarticulated.  This was sent for pathological examination.  Small portion of bone from the distal phalanx was removed and sent for bone culture.  The wound was flushed with copious amounts of irrigation.  Tourniquet was deflated.  Bleeders were Bovie cauterized.  Closure was performed with a 3-0 and 4-0 nylon.    Patient tolerated the procedure and anesthesia well.  Was transported from the OR to the PACU with all vital signs stable and vascular status intact. To be discharged per routine protocol.  Will follow up in approximately 1 week in the outpatient clinic.

## 2020-10-27 ENCOUNTER — Encounter: Payer: Self-pay | Admitting: Podiatry

## 2020-10-27 DIAGNOSIS — E119 Type 2 diabetes mellitus without complications: Secondary | ICD-10-CM | POA: Insufficient documentation

## 2020-10-27 DIAGNOSIS — I10 Essential (primary) hypertension: Secondary | ICD-10-CM | POA: Insufficient documentation

## 2020-10-27 DIAGNOSIS — M869 Osteomyelitis, unspecified: Secondary | ICD-10-CM

## 2020-10-27 LAB — BASIC METABOLIC PANEL
Anion gap: 7 (ref 5–15)
Anion gap: 7 (ref 5–15)
BUN: 19 mg/dL (ref 8–23)
BUN: 19 mg/dL (ref 8–23)
CO2: 21 mmol/L — ABNORMAL LOW (ref 22–32)
CO2: 23 mmol/L (ref 22–32)
Calcium: 8.7 mg/dL — ABNORMAL LOW (ref 8.9–10.3)
Calcium: 8.8 mg/dL — ABNORMAL LOW (ref 8.9–10.3)
Chloride: 106 mmol/L (ref 98–111)
Chloride: 106 mmol/L (ref 98–111)
Creatinine, Ser: 1.09 mg/dL — ABNORMAL HIGH (ref 0.44–1.00)
Creatinine, Ser: 1.09 mg/dL — ABNORMAL HIGH (ref 0.44–1.00)
GFR, Estimated: 53 mL/min — ABNORMAL LOW (ref >60)
GFR, Estimated: 53 mL/min — ABNORMAL LOW (ref >60)
Glucose, Bld: 141 mg/dL — ABNORMAL HIGH (ref 70–99)
Glucose, Bld: 144 mg/dL — ABNORMAL HIGH (ref 70–99)
Potassium: 5.6 mmol/L — ABNORMAL HIGH (ref 3.5–5.1)
Potassium: 4.1 mmol/L (ref 3.5–5.1)
Sodium: 134 mmol/L — ABNORMAL LOW (ref 135–145)
Sodium: 136 mmol/L (ref 135–145)

## 2020-10-27 LAB — GLUCOSE, CAPILLARY
Glucose-Capillary: 118 mg/dL — ABNORMAL HIGH (ref 70–99)
Glucose-Capillary: 102 mg/dL — ABNORMAL HIGH (ref 70–99)
Glucose-Capillary: 117 mg/dL — ABNORMAL HIGH (ref 70–99)
Glucose-Capillary: 140 mg/dL — ABNORMAL HIGH (ref 70–99)
Glucose-Capillary: 139 mg/dL — ABNORMAL HIGH (ref 70–99)
Glucose-Capillary: 122 mg/dL — ABNORMAL HIGH (ref 70–99)
Glucose-Capillary: 130 mg/dL — ABNORMAL HIGH (ref 70–99)
Glucose-Capillary: 146 mg/dL — ABNORMAL HIGH (ref 70–99)
Glucose-Capillary: 111 mg/dL — ABNORMAL HIGH (ref 70–99)

## 2020-10-27 LAB — C-REACTIVE PROTEIN: CRP: 6.9 mg/dL — ABNORMAL HIGH (ref <1.0)

## 2020-10-27 LAB — CBC
HCT: 34.4 % — ABNORMAL LOW (ref 36.0–46.0)
Hemoglobin: 11.3 g/dL — ABNORMAL LOW (ref 12.0–15.0)
MCH: 30.1 pg (ref 26.0–34.0)
MCHC: 32.8 g/dL (ref 30.0–36.0)
MCV: 91.5 fL (ref 80.0–100.0)
Platelets: 250 10*3/uL (ref 150–400)
RBC: 3.76 MIL/uL — ABNORMAL LOW (ref 3.87–5.11)
RDW: 13.3 % (ref 11.5–15.5)
WBC: 8.2 10*3/uL (ref 4.0–10.5)
nRBC: 0 % (ref 0.0–0.2)

## 2020-10-27 LAB — SEDIMENTATION RATE: Sed Rate: 54 mm/hr — ABNORMAL HIGH (ref 0–30)

## 2020-10-27 MED ORDER — DOXYCYCLINE HYCLATE 100 MG PO TABS
100.0000 mg | ORAL_TABLET | Freq: Two times a day (BID) | ORAL | Status: DC
  Administered 2020-10-27 – 2020-10-28 (×2): 100 mg via ORAL
  Filled 2020-10-27 (×2): qty 1

## 2020-10-27 MED ORDER — POLYETHYLENE GLYCOL 3350 17 G PO PACK
17.0000 g | PACK | Freq: Every day | ORAL | Status: DC
  Administered 2020-10-27 – 2020-10-28 (×2): 17 g via ORAL
  Filled 2020-10-27 (×2): qty 1

## 2020-10-27 MED ORDER — ENOXAPARIN SODIUM 40 MG/0.4ML ~~LOC~~ SOLN: 40.0000 mg | SUBCUTANEOUS | Status: DC

## 2020-10-27 MED ORDER — SENNOSIDES-DOCUSATE SODIUM 8.6-50 MG PO TABS
1.0000 | ORAL_TABLET | Freq: Every day | ORAL | Status: DC
  Administered 2020-10-27: 1 via ORAL
  Filled 2020-10-27: qty 1

## 2020-10-27 MED ORDER — METRONIDAZOLE IN NACL 5-0.79 MG/ML-% IV SOLN
500.0000 mg | Freq: Three times a day (TID) | INTRAVENOUS | Status: DC
  Filled 2020-10-27 (×3): qty 100

## 2020-10-27 MED ORDER — ENOXAPARIN SODIUM 40 MG/0.4ML ~~LOC~~ SOLN
0.5000 mg/kg | SUBCUTANEOUS | Status: DC
  Administered 2020-10-27: 40 mg via SUBCUTANEOUS
  Filled 2020-10-27: qty 0.4

## 2020-10-27 NOTE — Progress Notes (Signed)
OT Cancellation Note  Patient Details Name: Joann [name] MRN: [id number] DOB: 11-10-1945   Cancelled Treatment:    Reason Eval/Treat Not Completed: Medical issues which prohibited therapy. OT consult received.  Chart reviewed.  Pt's potassium noted to be elevated to 5.6 this morning.  Per OT guidelines for elevated potassium, will hold OT at this time and re-attempt OT evaluation at a later date/time as medically appropriate.  Hanley Hays, MPH, MS, OTR/L ascom [phone] 10/27/20, 11:45 AM

## 2020-10-27 NOTE — Progress Notes (Signed)
PT Cancellation Note  Patient Details Name: [name] MRN: [id number] DOB: [birth year]   Cancelled Treatment:    Reason Eval/Treat Not Completed: Patient not medically ready.  PT consult received.  Chart reviewed.  Pt's potassium noted to be elevated to 5.6 this morning.  Per PT guidelines for elevated potassium, will hold PT at this time and re-attempt PT evaluation at a later date/time as medically appropriate.  Leitha Bleak, PT 10/27/20, 8:41 AM

## 2020-10-27 NOTE — Progress Notes (Signed)
Notified MD Damita Dunnings of lab- K+5.6

## 2020-10-27 NOTE — Progress Notes (Addendum)
[facility] Progress Note  Patient: Joann [name]    OZD:[id number]  DOA: 10/24/2020     Date of Service: the patient was seen and examined on 10/27/2020  Chief Complaint  Patient presents with  . Wound Infection   Brief hospital course: Joann [name] is a 75 y.o. female with medical history significant of breast cancer, diabetes, hypertension who has had history of ruptured blister on plantar aspect of her right foot  X 1 week. She noted this am that he toe was swollen and red and so she followed up with podiatry am of presentation. On evaluation she was found to have redness/warmth/drainage/swelling and pain of right great toe. In the office she had xray that showed pathological fracture of the distal great toe with erosive changes consistent with osteomyelitis and due to this she was referred to the ed. Patient notes she has had associated chills but noted no fever. She denies n/v/d/abdominal pain/ sob/ chest pain, new cough or uri symptoms.  ED Course:  Vitals: 98.3, bp 134/57, hr 108, rr 20  Sat 97% on ra  Labs: wbc 19.3, hbg 14, plt 438,   Lactic 6.1 NA 132, K3.8, C02: 21, cr 2.15 up from prior of 0.86 MRI: pending  tx with vanc/ctx  Patient was seen by Dr Vickki Muff in ED who notes tenative plan for OR time 4/3    Assessment and Plan:  # Acute osteomyelitis of right great toe, Associated with complicated soft tissue infection. 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 Clinically improving. Blood cultures ngtd - s/p vanc/ceftriaxone. Discussed w/ podiatry, margins clean, advising 10 day course doxycycline, will order -podiatry consulted   - f/u biopsy report and operative culture, both pending - pt/ot consulted  # Sepsis due to above Resolved  # AKI 2/2 infection. Resolved   # DMII:  a1c 7.0  - placed on finger sticks and sliding scale   # HTN WNL -resume bp medications as able   # Hyperkalemia  after replacement of hypokalemia yesterday, vs lab error.  K 5.6 this morning - repeat PM K  # Hypomagnesemia, mag repleted.  # Constipation - cont senna, add miralax  Body mass index is 33.02 kg/m.  Interventions:       Diet: Heart healthy/carb modified DVT Prophylaxis: lovenox  Advance goals of care discussion: Full code  Family Communication: husband updated @ bedside 4/4  Disposition:  Pt is from Home, admitted with sepsis and osteomyelitis, s/p right great toe amputation done on 4/3, pending PT eval and biopsy report, which precludes a safe discharge. Discharge to home vs snf, when cleared by podiatrist and after pt/ot consult  Subjective:  Feeling well, pain controlled, has appetite, no fevers, no n/v/d.   Physical Exam: General:  alert oriented to time, place, and person.  Appear in no distress, affect appropriate Eyes: PERRLA ENT: Oral Mucosa Clear, moist  Neck: no JVD,  Cardiovascular: S1 and S2 Present, no Murmur,  Respiratory: good respiratory effort, Bilateral Air entry equal and Decreased, no Crackles, no wheezes Abdomen: Bowel Sound present, Soft and no tenderness,  Skin: no rashes Extremities: mild Pedal edema, no calf tenderness, right great toe s/p amputation, dressing CDI.   Neurologic: moving all 4 extremities equally  Vitals:   10/26/20 1612 10/26/20 2030 10/27/20 0427 10/27/20 0815  BP: (!) 118/58 118/67 119/72 120/70  Pulse: 63 (!) 56 (!) 55 (!) 51  Resp: 15 20 20 16   Temp: (!) 97.5 F (36.4 C) (!) 97.4 F (36.3 C) (!)  97.5 F (36.4 C) (!) 97.4 F (36.3 C)  TempSrc:  Oral Oral Oral  SpO2: 97% 95% 97% 95%  Weight:      Height:        Intake/Output Summary (Last 24 hours) at 10/27/2020 6333 Last data filed at 10/26/2020 1830 Gross per 24 hour  Intake 811 ml  Output 250 ml  Net 561 ml   Filed Weights   10/24/20 1457 10/25/20 0359  Weight: 78.5 kg 81.9 kg    Data Reviewed: I have personally reviewed and interpreted daily labs, tele strips, imagings as discussed above. I reviewed all  nursing notes, pharmacy notes, vitals, pertinent old records I have discussed plan of care as described above with RN and patient/family.  CBC: Recent Labs  Lab 10/24/20 1504 10/25/20 0407 10/26/20 0418 10/27/20 0451  WBC 19.3* 11.7* 6.6 8.2  NEUTROABS 16.5*  --   --   --   HGB 14.0 11.7* 11.8* 11.3*  HCT 42.4 35.4* 34.9* 34.4*  MCV 90.2 89.8 88.8 91.5  PLT 438* 275 283 545   Basic Metabolic Panel: Recent Labs  Lab 10/24/20 1504 10/24/20 1659 10/25/20 0407 10/26/20 0418 10/27/20 0451  NA  --  132* 134* 135 134*  K  --  3.8 3.5 3.4* 5.6*  CL  --  94* 102 102 106  CO2  --  21* 24 24 21*  GLUCOSE  --  172* 156* 133* 141*  BUN  --  32* 27* 17 19  CREATININE 2.15* 2.15* 1.46* 1.05* 1.09*  CALCIUM  --  9.4 8.3* 8.6* 8.7*  MG  --   --   --  1.6*  --   PHOS  --   --   --  2.5  --     Studies: No results found.  Scheduled Meds: . heparin  5,000 Units Subcutaneous Q8H  . insulin aspart  0-9 Units Subcutaneous TID WC  . insulin aspart  2 Units Subcutaneous TID WC   Continuous Infusions: . cefTRIAXone (ROCEPHIN)  IV Stopped (10/26/20 1807)  . magnesium sulfate bolus IVPB Stopped (10/26/20 1356)  . vancomycin Stopped (10/26/20 1245)   PRN Meds: acetaminophen **OR** acetaminophen, albuterol, hydrALAZINE, HYDROcodone-acetaminophen, morphine injection, ondansetron **OR** ondansetron (ZOFRAN) IV, senna-docusate  Time spent: 35 minutes  Author: Laurey Arrow, MD [facility] 10/27/2020 9:27 AM  To reach On-call, see care teams to locate the attending and reach out to them via [email or web address]. If 7PM-7AM, please contact night-coverage If you still have difficulty reaching the attending provider, please page the [facility] (Director on Call) for [facility] on amion for assistance.

## 2020-10-27 NOTE — Evaluation (Signed)
Occupational Therapy Evaluation Patient Details Name: Joann [name] MRN: [id number] DOB: 1946/07/05 Today's Date: 10/27/2020    History of Present Illness Pt is a 75 y.o. female presenting to ED 4/1 with R foot wound infection.  Pt admitted with complicated soft tissue infection R foot with associated acute osteomyelitis of R great toe, and sepsis.  S/p R great toe amputation at MTP joint 4/3 secondary to osteomyelitis.  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   Clinical Impression   Pt was seen for OT evaluation this date. Prior to hospital admission and R great toe amputation, pt was modified independent with ADL (sponge baths), ambulating without AD. Pt lives with her spouse of 11 yrs in a 1 story home with basement. Currently pt demonstrates impairments as described below (See OT problem list) which functionally limit her ability to perform ADL/self-care tasks. Pt currently requires PRN MIN A for LB ADL tasks and CGA for ADL transfers with AD. Post-op shoe not in room for evaluation so ADL mobility limited this session. Pt/spouse instructed in home/routines modifications and AE/DME for ADL tasks to improve safety for bathing, dressing, and shower transfers; pt/spouse verbalized understanding. Pt would benefit from skilled OT services while hospitalized to address noted impairments and functional limitations (see below for any additional details) in order to maximize safety and independence while minimizing falls risk and caregiver burden. Upon hospital discharge, do not anticipate further skilled OT needs.     Follow Up Recommendations  No OT follow up;Other (comment) (supervision for ADL transfers/mobility)    Equipment Recommendations  None recommended by OT    Recommendations for Other Services       Precautions / Restrictions Precautions Precautions: Fall Required Braces or Orthoses: Other Brace Other Brace: pt pending  post-op shoe Restrictions Weight Bearing Restrictions: Yes RLE Weight Bearing:  (see restrictions below) Other Position/Activity Restrictions: WB with assist; WB to heel; Avoid WB to forefoot      Mobility Bed Mobility Overal bed mobility: Modified Independent                 Transfers    Balance Overall balance assessment: Needs assistance Sitting-balance support: No upper extremity supported;Feet supported Sitting balance-Leahy Scale: Normal Sitting balance - Comments: steady sitting reaching outside BOS                             ADL either performed or assessed with clinical judgement   ADL Overall ADL's : Needs assistance/impaired                                       General ADL Comments: Pt performed LB ADL tasks with CGA for completing over hips in standing, able to perform seated without assist; CGA for ADL transfers; pt/spouse confirm spouse able to provide needed level of assist     Vision Baseline Vision/History: Wears glasses Wears Glasses: Reading only Patient Visual Report: No change from baseline       Perception     Praxis      Pertinent Vitals/Pain Pain Assessment: No/denies pain     Hand Dominance Right   Extremity/Trunk Assessment Upper Extremity Assessment Upper Extremity Assessment: Overall WFL for tasks assessed   Lower Extremity Assessment Lower Extremity Assessment: Generalized weakness (at least 3/5 AROM R DF/PF)   Cervical /  Trunk Assessment Cervical / Trunk Assessment: Other exceptions Cervical / Trunk Exceptions: forward head/shoulders   Communication Communication Communication: No difficulties   Cognition Arousal/Alertness: Awake/alert Behavior During Therapy: WFL for tasks assessed/performed Overall Cognitive Status: Within Functional Limits for tasks assessed                                     General Comments  ace wrap bandaging/dressing to R foot (no drainage noted  on dressings/ace wrap)    Exercises Other Exercises Other Exercises: Pt/spouse instructed in home/routines modifications and AE/DME for ADL tasks to improve safety for bathing, dressing, and shower transfers; pt/spouse verbalized understanding   Shoulder Instructions      Home Living Family/patient expects to be discharged to:: Private residence Living Arrangements: Spouse/significant other Available Help at Discharge: Family;Available 24 hours/day Type of Home: House Home Access: Stairs to enter [employer] of Steps: 2 Entrance Stairs-Rails: None Home Layout: One level;Other (Comment);Laundry or work area in basement (with [employer] (husband does Medical sales representative))     Bathroom Shower/Tub: Tub/shower unit;Walk-in shower   Bathroom Toilet: Handicapped height     Home Equipment: Environmental consultant - 4 wheels;Cane - single point;Bedside commode;Walker - 2 wheels          Prior Functioning/Environment Level of Independence: Independent        Comments: Pt reports no recent falls; husband supportive and can assist        OT Problem List: Impaired balance (sitting and/or standing);Decreased knowledge of use of DME or AE      OT Treatment/Interventions: Self-care/ADL training;Therapeutic exercise;Therapeutic activities;DME and/or AE instruction;Patient/family education;Balance training    OT Goals(Current goals can be found in the care plan section) Acute Rehab OT Goals Patient Stated Goal: to go home OT Goal Formulation: With patient/family Time For Goal Achievement: 11/10/20 Potential to Achieve Goals: Good ADL Goals Pt Will Perform Lower Body Dressing: with modified independence;sit to/from stand Pt Will Transfer to Toilet: with modified independence;ambulating (BSC over toilet, LRAD for amb, post op shoe, heel WBing) Additional ADL Goal #1: Pt will perform seated sponge bath with set up assist and remote supervision for safety.  OT Frequency: Min 1X/week   Barriers  to D/C:            Co-evaluation              AM-PAC OT "6 Clicks" Daily Activity     Outcome Measure Help from another person eating meals?: None Help from another person taking care of personal grooming?: None Help from another person toileting, which includes using toliet, bedpan, or urinal?: A Little Help from another person bathing (including washing, rinsing, drying)?: A Little Help from another person to put on and taking off regular upper body clothing?: None Help from another person to put on and taking off regular lower body clothing?: A Little 6 Click Score: 21   End of Session    Activity Tolerance: Patient tolerated treatment well Patient left: in bed;with call bell/phone within reach;with bed alarm set;with family/visitor present  OT Visit Diagnosis: Other abnormalities of gait and mobility (R26.89);Muscle weakness (generalized) (M62.81)                Time: 0258-5277 OT Time Calculation (min): 14 min Charges:  OT General Charges $OT Visit: 1 Visit OT Evaluation $OT Eval Low Complexity: 1 Low OT Treatments $Self Care/Home Management : 8-22 mins  Hanley Hays, MPH, MS, OTR/L  ascom [phone] 10/27/20, 3:48 PM

## 2020-10-27 NOTE — Progress Notes (Signed)
1 Day Post-Op   Subjective/Chief Complaint: Patient seen.  No significant complaints of pain in her foot.  Complains mostly of being constantly stuck by needles.   Objective: Vital signs in last 24 hours: Temp:  [97.4 F (36.3 C)-97.5 F (36.4 C)] 97.4 F (36.3 C) (04/04 0815) Pulse Rate:  [51-63] 51 (04/04 0815) Resp:  [15-20] 16 (04/04 0815) BP: (118-120)/(58-72) 120/70 (04/04 0815) SpO2:  [95 %-97 %] 95 % (04/04 0815) Last BM Date: 10/24/20  Intake/Output from previous day: 04/03 0701 - 04/04 0700 In: 1211 [P.O.:361; I.V.:300; IV Piggyback:550] Out: 255 [Urine:250; Blood:5] Intake/Output this shift: No intake/output data recorded.  The bandage is dry and intact.  Upon removal there is some mild dried blood on the bandaging.  The incision is well coapted with no significant erythema or sign of infection.  Appears stable.    Lab Results:  Recent Labs    10/26/20 0418 10/27/20 0451  WBC 6.6 8.2  HGB 11.8* 11.3*  HCT 34.9* 34.4*  PLT 283 250   BMET Recent Labs    10/27/20 0451 10/27/20 1142  NA 134* 136  K 5.6* 4.1  CL 106 106  CO2 21* 23  GLUCOSE 141* 144*  BUN 19 19  CREATININE 1.09* 1.09*  CALCIUM 8.7* 8.8*   PT/INR Recent Labs    10/25/20 0407  LABPROT 15.0  INR 1.2   ABG No results for input(s): PHART, HCO3 in the last 72 hours.  Invalid input(s): PCO2, PO2  Studies/Results: No results found.  Anti-infectives: Anti-infectives (From admission, onward)   Start     Dose/Rate Route Frequency Ordered Stop   10/26/20 0900  vancomycin (VANCOCIN) IVPB 1000 mg/200 mL premix        1,000 mg 200 mL/hr over 60 Minutes Intravenous Every 36 hours 10/26/20 0822     10/25/20 1700  cefTRIAXone (ROCEPHIN) 2 g in sodium chloride 0.9 % 100 mL IVPB        2 g 200 mL/hr over 30 Minutes Intravenous Every 24 hours 10/24/20 1911     10/24/20 1730  vancomycin (VANCOREADY) IVPB 750 mg/150 mL       "Followed by" Linked Group Details   750 mg 150 mL/hr over 60  Minutes Intravenous  Once 10/24/20 1627 10/24/20 1946   10/24/20 1630  vancomycin (VANCOCIN) IVPB 1000 mg/200 mL premix  Status:  Discontinued        1,000 mg 200 mL/hr over 60 Minutes Intravenous  Once 10/24/20 1623 10/24/20 1627   10/24/20 1630  cefTRIAXone (ROCEPHIN) 2 g in sodium chloride 0.9 % 100 mL IVPB        2 g 200 mL/hr over 30 Minutes Intravenous  Once 10/24/20 1623 10/24/20 1800   10/24/20 1630  vancomycin (VANCOCIN) IVPB 1000 mg/200 mL premix       "Followed by" Linked Group Details   1,000 mg 200 mL/hr over 60 Minutes Intravenous  Once 10/24/20 1627 10/24/20 1845      Assessment/Plan: s/p Procedure(s): right great toe amputation (Right) Assessment: Stable status post right great toe amputation.   Plan: Betadine and a sterile bandage reapplied to the right foot.  Patient was instructed to keep this dry and intact until evaluated outpatient in approximately 1 week with Dr. Vickki Muff.  Patient is stable for discharge from podiatry standpoint when medically stable.  LOS: 3 days    Durward Fortes 10/27/2020

## 2020-10-27 NOTE — Plan of Care (Signed)

## 2020-10-27 NOTE — Evaluation (Signed)
Physical Therapy Evaluation Patient Details Name: Joann [name] MRN: [id number] DOB: 31-Aug-1945 Today's Date: 10/27/2020   History of Present Illness  Pt is a 75 y.o. female presenting to ED 4/1 with R foot wound infection.  Pt admitted with complicated soft tissue infection R foot with associated acute osteomyelitis of R great toe, and sepsis.  S/p R great toe amputation at MTP joint 4/3 secondary to osteomyelitis.  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  Clinical Impression  Prior to hospital admission, pt was independent with ambulation; lives on main level of home with 2 STE.  Pt tolerated LE ex's in bed fairly well.  Currently pt is modified independent with bed mobility; CGA with transfers using RW; and CGA taking 2 small steps forwards and then 2 small steps backwards with RW use (initial vc's for WB'ing precautions).  Pt reports she has already gotten up to [facility] with nursing assist but does not have any protective shoe for her R foot (no orders for protective shoe noted in chart prior to PT evaluation); therapist had pt take a couple steps to make sure pt able to maintain [place] precautions when getting up to use BSC but deferred ambulation d/t concerns for pt needing protective footwear for R foot for ambulation activities.  Discussed (with pt's nurse) pt's [place] status and concerns for need for protective footwear for R foot (nurse messaged podiatry to address footwear concerns).  Pt would benefit from skilled PT to address noted impairments and functional limitations (see below for any additional details).  Upon hospital discharge, pt would benefit from [place] and initial 24/7 assist (anticipate pt will have more limited mobility initially while recovering from surgery).    Follow Up Recommendations Home health PT;Supervision/Assistance - 24 hour    Equipment Recommendations  Rolling walker with 5" wheels (youth sized)     Recommendations for Other Services OT consult     Precautions / Restrictions Precautions Precautions: Fall Restrictions Weight Bearing Restrictions: Yes RLE Weight Bearing:  (see restrictions below) Other Position/Activity Restrictions: WB with assist; WB to heel; Avoid WB to forefoot      Mobility  Bed Mobility Overal bed mobility: Modified Independent             General bed mobility comments: Semi-supine to/from sitting with HOB elevated without any noted difficulty    Transfers Overall transfer level: Needs assistance Equipment used: Rolling walker (2 wheeled) Transfers: Sit to/from Stand Sit to Stand: Min guard         General transfer comment: vc's for UE/LE placement and positioning to maintain R LE WB'ing precautions  Ambulation/Gait Ambulation/Gait assistance: Min guard Gait Distance (Feet):  (pt took 2 small steps forward and then backwards) Assistive device: Rolling walker (2 wheeled)       General Gait Details: steady with RW; initial vc's for WB'ing precautions  Stairs            Wheelchair Mobility    Modified Rankin (Stroke Patients Only)       Balance Overall balance assessment: Needs assistance Sitting-balance support: No upper extremity supported;Feet supported Sitting balance-Leahy Scale: Normal Sitting balance - Comments: steady sitting reaching outside BOS   Standing balance support: Single extremity supported Standing balance-Leahy Scale: Fair Standing balance comment: steady standing with at least single UE support  Pertinent Vitals/Pain Pain Assessment: No/denies pain  Vitals (HR and O2 on room air) stable and WFL throughout treatment session.    Home Living Family/patient expects to be discharged to:: Private residence Living Arrangements: Spouse/significant other Available Help at Discharge: Family Type of Home: House Home Access: Stairs to enter Entrance Stairs-Rails:  None Entrance Stairs-Number of Steps: 2 Home Layout: One level;Other (Comment);Laundry or work area in basement (with [employer] (husband does Medical sales representative)) Home Equipment: Environmental consultant - 4 wheels;Cane - single point;Bedside commode      Prior Function Level of Independence: Independent         Comments: Pt reports no recent falls; husband supportive and can assist     Hand Dominance        Extremity/Trunk Assessment   Upper Extremity Assessment Upper Extremity Assessment: Defer to OT evaluation    Lower Extremity Assessment Lower Extremity Assessment: Generalized weakness (at least 3/5 AROM R DF/PF)    Cervical / Trunk Assessment Cervical / Trunk Assessment: Other exceptions Cervical / Trunk Exceptions: forward head/shoulders  Communication   Communication: No difficulties  Cognition Arousal/Alertness: Awake/alert Behavior During Therapy: WFL for tasks assessed/performed Overall Cognitive Status: Within Functional Limits for tasks assessed                                        General Comments General comments (skin integrity, edema, etc.): ace wrap bandaging/dressing to R foot (no drainage noted on dressings/ace wrap).  Nursing cleared pt for participation in physical therapy.  Pt agreeable to PT session.    Exercises General Exercises - Lower Extremity Ankle Circles/Pumps: AROM;Strengthening;Both;10 reps;Supine Quad Sets: AROM;Strengthening;Both;10 reps;Supine Gluteal Sets: AROM;Strengthening;Both;10 reps;Supine Short Arc Quad: AROM;Strengthening;Both;10 reps;Supine Heel Slides: AROM;Strengthening;Both;10 reps;Supine Hip ABduction/ADduction: AROM;Strengthening;Both;10 reps;Supine Straight Leg Raises: AROM;Strengthening;Both;10 reps;Supine   Assessment/Plan    PT Assessment Patient needs continued PT services  PT Problem List Decreased strength;Decreased activity tolerance;Decreased balance;Decreased mobility;Decreased knowledge of use of  DME;Decreased knowledge of precautions;Pain;Decreased skin integrity       PT Treatment Interventions DME instruction;Gait training;Stair training;Functional mobility training;Therapeutic activities;Therapeutic exercise;Balance training;Patient/family education    PT Goals (Current goals can be found in the Care Plan section)  Acute Rehab PT Goals Patient Stated Goal: to go home PT Goal Formulation: With patient Time For Goal Achievement: 11/10/20 Potential to Achieve Goals: Good    Frequency 7X/week   Barriers to discharge        Co-evaluation               AM-PAC PT "6 Clicks" Mobility  Outcome Measure Help needed turning from your back to your side while in a flat bed without using bedrails?: None Help needed moving from lying on your back to sitting on the side of a flat bed without using bedrails?: None Help needed moving to and from a bed to a chair (including a wheelchair)?: A Little Help needed standing up from a chair using your arms (e.g., wheelchair or bedside chair)?: A Little Help needed to walk in hospital room?: A Little Help needed climbing 3-5 steps with a railing? : A Lot 6 Click Score: 19    End of Session Equipment Utilized During Treatment: Gait belt Activity Tolerance: Patient tolerated treatment well Patient left: in bed;with call bell/phone within reach;with bed alarm set;Other (comment) (B LE's elevated on 2 pillows) Nurse Communication: Mobility status;Precautions;Other (comment) (pt's [place] status and need for surgical/post-op shoe to protect  R foot for ambulation) PT Visit Diagnosis: Other abnormalities of gait and mobility (R26.89);Muscle weakness (generalized) (M62.81);Difficulty in walking, not elsewhere classified (R26.2)    Time: 1771-1657 PT Time Calculation (min) (ACUTE ONLY): 27 min   Charges:   PT Evaluation $PT Eval Low Complexity: 1 Low PT Treatments $Therapeutic Activity: 8-22 mins       Leitha Bleak, PT 10/27/20,  3:16 PM

## 2020-10-28 DIAGNOSIS — M869 Osteomyelitis, unspecified: Secondary | ICD-10-CM | POA: Diagnosis not present

## 2020-10-28 LAB — GLUCOSE, CAPILLARY
Glucose-Capillary: 147 mg/dL — ABNORMAL HIGH (ref 70–99)
Glucose-Capillary: 98 mg/dL (ref 70–99)
Glucose-Capillary: 105 mg/dL — ABNORMAL HIGH (ref 70–99)

## 2020-10-28 LAB — CBC
HCT: 34.4 % — ABNORMAL LOW (ref 36.0–46.0)
Hemoglobin: 11.2 g/dL — ABNORMAL LOW (ref 12.0–15.0)
MCH: 29.6 pg (ref 26.0–34.0)
MCHC: 32.6 g/dL (ref 30.0–36.0)
MCV: 90.8 fL (ref 80.0–100.0)
Platelets: 311 10*3/uL (ref 150–400)
RBC: 3.79 MIL/uL — ABNORMAL LOW (ref 3.87–5.11)
RDW: 13.4 % (ref 11.5–15.5)
WBC: 8.5 10*3/uL (ref 4.0–10.5)
nRBC: 0 % (ref 0.0–0.2)

## 2020-10-28 LAB — BASIC METABOLIC PANEL
Anion gap: 7 (ref 5–15)
BUN: 21 mg/dL (ref 8–23)
CO2: 24 mmol/L (ref 22–32)
Calcium: 8.9 mg/dL (ref 8.9–10.3)
Chloride: 106 mmol/L (ref 98–111)
Creatinine, Ser: 1.07 mg/dL — ABNORMAL HIGH (ref 0.44–1.00)
GFR, Estimated: 54 mL/min — ABNORMAL LOW (ref >60)
Glucose, Bld: 105 mg/dL — ABNORMAL HIGH (ref 70–99)
Potassium: 4.1 mmol/L (ref 3.5–5.1)
Sodium: 137 mmol/L (ref 135–145)

## 2020-10-28 LAB — C-REACTIVE PROTEIN: CRP: 3.7 mg/dL — ABNORMAL HIGH (ref <1.0)

## 2020-10-28 LAB — SURGICAL PATHOLOGY

## 2020-10-28 LAB — SEDIMENTATION RATE: Sed Rate: 56 mm/hr — ABNORMAL HIGH (ref 0–30)

## 2020-10-28 MED ORDER — AMOXICILLIN-POT CLAVULANATE 875-125 MG PO TABS: 1.0000 | ORAL_TABLET | Freq: Two times a day (BID) | ORAL | 0 refills | Status: DC

## 2020-10-28 MED ORDER — AMOXICILLIN-POT CLAVULANATE 875-125 MG PO TABS
1.0000 | ORAL_TABLET | Freq: Two times a day (BID) | ORAL | Status: DC
  Administered 2020-10-28: 1 via ORAL
  Filled 2020-10-28: qty 1

## 2020-10-28 NOTE — Discharge Summary (Addendum)
Joann [name] DXA:[id number] DOB: 07-09-1946 DOA: 10/24/2020  PCP: Baxter Hire, MD  Admit date: 10/24/2020 Discharge date: 10/28/2020  Time spent: 35 minutes  Recommendations for Outpatient Follow-up:  1. Podiatry f/u 1 week  2. F/u pcp 1-2 weeks, assess bp then    Discharge Diagnoses:  Active Problems:   Osteomyelitis ([place])   Sepsis ([place])   Discharge Condition: stable  Diet recommendation: heart healthy  Filed Weights   10/24/20 1457 10/25/20 0359  Weight: 78.5 kg 81.9 kg    History of present illness:  Joann [name] is a 75 y.o. female with medical history significant of breast cancer, diabetes, hypertension who has had history of ruptured blister on plantar aspect of her right foot  X 1 week. She noted this am that he toe was swollen and red and so she followed up with podiatry am of presentation. On evaluation she was found to have redness/warmth/drainage/swelling and pain of right great toe. In the office she had xray that showed pathological fracture of the distal great toe with erosive changes consistent with osteomyelitis and due to this she was referred to the ed. Patient notes she has had associated chills but noted no fever. She denies n/v/d/abdominal pain/ sob/ chest pain, new cough or uri symptoms.  Hospital Course:  # Acute osteomyelitis of right great toe, Associated with complicated soft tissue infection. 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 Clinically improving. Blood cultures ngtd. Biopsy culture growing strep anginosis and rare proteus mirabilis. Is s/p vanc/ceftriaxone. Podiatry advised doxycycline, pharmacy advises that doxy not effective against proteus thus will discharge on 10-day course of augmentin which ID pharmacy has reviewed. Given pcn allergy (pruritus), gave dose of augmentin prior to discharge and patient tolerated. Margins were clean. Will f/u with podiatry in 1 week. Home PT ordered.   # Sepsis, sever due to above. Lactate  6.1. Resolved  # AKI 2/2 infection. Resolved  # DMII:  a1c 7.0, glucose controlled here  # HTN Here bp wnl off home bp meds - hold home hctz and lisinopril pending hospital f/u appt   Procedures:  Right first toe amputation   Consultations:  podiatry  Discharge Exam: Vitals:   10/28/20 0420 10/28/20 0832  BP: 136/75 137/69  Pulse: 74 71  Resp: 18 16  Temp: 97.6 F (36.4 C) 98.7 F (37.1 C)  SpO2: 97% 98%    General:  alert oriented to time, place, and person.  Appear in no distress, affect appropriate Eyes: PERRLA ENT: Oral Mucosa Clear, moist  Neck: no JVD,  Cardiovascular: S1 and S2 Present, no Murmur,  Respiratory: good respiratory effort, Bilateral Air entry equal and Decreased, no Crackles, no wheezes Abdomen: Bowel Sound present, Soft and no tenderness,  Skin: no rashes Extremities: mild Pedal edema, no calf tenderness, right great toe s/p amputation, dressing CDI.   Neurologic: moving all 4 extremities equally  Discharge Instructions   Discharge Instructions    Diet - low sodium heart healthy   Complete by: As directed    Increase activity slowly   Complete by: As directed    Leave dressing on - Keep it clean, dry, and intact until clinic visit   Complete by: As directed      Allergies as of 10/28/2020      Reactions   Neosporin [neomycin-bacitracin Zn-polymyx] Itching   Penicillins Itching   Has patient had a PCN reaction causing immediate rash, facial/tongue/throat swelling, SOB or lightheadedness with hypotension: no Has patient had a PCN reaction causing  severe rash involving mucus membranes or skin necrosis: no Has patient had a PCN reaction that required hospitalization no Has patient had a PCN reaction occurring within the last 10 years:no If all of the above answers are "NO", then may proceed with Cephalosporin use.   Statins    hallucinations      Medication List    STOP taking these medications   hydrochlorothiazide 12.5 MG  tablet Commonly known as: HYDRODIURIL   lisinopril 10 MG tablet Commonly known as: ZESTRIL     TAKE these medications   amoxicillin-clavulanate 875-125 MG tablet Commonly known as: AUGMENTIN Take 1 tablet by mouth every 12 (twelve) hours.   aspirin EC 81 MG tablet Take 81 mg by mouth daily as needed (headaches).   CALCIUM + D PO Take 1 tablet by mouth daily.   HYDROcodone-acetaminophen 5-325 MG tablet Commonly known as: Norco Take 1 tablet by mouth every 4 (four) hours as needed for moderate pain.   metFORMIN 500 MG tablet Commonly known as: GLUCOPHAGE Take 500 mg by mouth 2 (two) times daily.            Discharge Care Instructions  (From admission, onward)         Start     Ordered   10/28/20 0000  Leave dressing on - Keep it clean, dry, and intact until clinic visit        10/28/20 1355         Allergies  Allergen Reactions  . Neosporin [Neomycin-Bacitracin Zn-Polymyx] Itching  . Penicillins Itching    Has patient had a PCN reaction causing immediate rash, facial/tongue/throat swelling, SOB or lightheadedness with hypotension: no Has patient had a PCN reaction causing severe rash involving mucus membranes or skin necrosis: no Has patient had a PCN reaction that required hospitalization no Has patient had a PCN reaction occurring within the last 10 years:no If all of the above answers are "NO", then may proceed with Cephalosporin use.   . Statins     hallucinations    Follow-up Information    Samara Deist, DPM Follow up in 1 week(s).   Specialty: Podiatry Contact information: [place] [address] [phone]        Baxter Hire, MD. Schedule an appointment as soon as possible for a visit.   Specialty: Internal Medicine Contact information: [place]  [address] [phone]                The results of significant diagnostics from this hospitalization (including imaging,  microbiology, ancillary and laboratory) are listed below for reference.    Significant Diagnostic Studies: MR FOOT RIGHT WO CONTRAST  Result Date: 10/24/2020 CLINICAL DATA:  Right great toe infection. Erythema, warmth and drainage. History of diabetes and breast cancer. EXAM: MRI OF THE RIGHT FOREFOOT WITHOUT CONTRAST TECHNIQUE: Multiplanar, multisequence MR imaging of the right forefoot was performed. No intravenous contrast was administered. COMPARISON:  None. FINDINGS: Bones/Joint/Cartilage There is abnormal T2 hyperintensity throughout the marrow of the distal phalanx of the great toe. This is associated with decreased T1 signal and consistent with osteomyelitis. There is mild nonspecific T2 hyperintensity within the head of the proximal phalanx without cortical destruction or T1 signal abnormality. There are small 1st interphalangeal and metatarsal-phalangeal joint effusions. The other toes appear unremarkable. The metatarsals and visualized tarsal bones appear normal. Ligaments The Lisfranc ligament is intact. The collateral ligaments of the metatarsophalangeal joints appear intact. Muscles and Tendons Mild fatty atrophy of the  forefoot musculature. No focal fluid collection or significant tenosynovitis. Soft tissues Soft tissue swelling within the great toe with possible skin ulceration along the plantar surface. No focal fluid collection or foreign body identified. IMPRESSION: 1. Findings are consistent with osteomyelitis of the distal phalanx of the great toe. There is nonspecific mild T2 hyperintensity within the head of the proximal phalanx. 2. Soft tissue swelling in the great toe without focal fluid collection. Probable skin ulceration along the plantar surface. 3. The additional toes and metatarsals appear unremarkable. Electronically Signed   By: Richardean Sale M.D.   On: 10/24/2020 21:36    Microbiology: Recent Results (from the past 240 hour(s))  MRSA PCR Screening     Status: None    Collection Time: 10/24/20  5:47 AM   Specimen: Nasopharyngeal  Result Value Ref Range Status   MRSA by PCR NEGATIVE NEGATIVE Final    Comment:        The GeneXpert MRSA Assay (FDA approved for NASAL specimens only), is one component of a comprehensive MRSA colonization surveillance program. It is not intended to diagnose MRSA infection nor to guide or monitor treatment for MRSA infections. Performed at [facility], [address], [place], [place] [address]   Aerobic/Anaerobic Culture w Gram Stain (surgical/deep wound)     Status: None (Preliminary result)   Collection Time: 10/24/20  2:00 PM   Specimen: Wound  Result Value Ref Range Status   Specimen Description   Final    WOUND Performed at [facility], [address], [place], [place] [address]    Special Requests   Final    CELLULITITS RIGHT GREAT TOE Performed at [facility], [address] [place], [place] [address]    Gram Stain   Final    RARE WBC PRESENT, PREDOMINANTLY PMN ABUNDANT GRAM NEGATIVE RODS MODERATE GRAM POSITIVE COCCI FEW GRAM VARIABLE ROD FEW GRAM POSITIVE RODS    Culture   Final    FEW PROTEUS MIRABILIS FEW STAPHYLOCOCCUS AUREUS SUSCEPTIBILITIES TO FOLLOW MODERATE BACTEROIDES THETAIOTAOMICRON BETA LACTAMASE POSITIVE Performed at [facility], [place] [address], [place], [place] [address]    Report Status PENDING  Incomplete  Resp Panel by RT-PCR (Flu A&B, Covid) Nasopharyngeal Swab     Status: None   Collection Time: 10/24/20  4:33 PM   Specimen: Nasopharyngeal Swab; Nasopharyngeal(NP) swabs in vial transport medium  Result Value Ref Range Status   SARS Coronavirus 2 by RT PCR NEGATIVE NEGATIVE Final    Comment: (NOTE) SARS-CoV-2 target nucleic acids are NOT DETECTED.  The SARS-CoV-2 RNA is generally detectable in upper respiratory specimens during the acute phase of infection. The lowest concentration of SARS-CoV-2 viral copies this assay can detect  is 138 copies/mL. A negative result does not preclude SARS-Cov-2 infection and should not be used as the sole basis for treatment or other patient management decisions. A negative result may occur with  improper specimen collection/handling, submission of specimen other than nasopharyngeal swab, presence of viral mutation(s) within the areas targeted by this assay, and inadequate number of viral copies(<138 copies/mL). A negative result must be combined with clinical observations, patient history, and epidemiological information. The expected result is Negative.  Fact Sheet for Patients:  EntrepreneurPulse.com.au  Fact Sheet for Healthcare Providers:  IncredibleEmployment.be  This test is no t yet approved or cleared by the [place] FDA and  has been authorized for detection and/or diagnosis of SARS-CoV-2 by FDA under an Emergency Use Authorization (EUA). This EUA will  remain  in effect (meaning this test can be used) for the duration of the COVID-19 declaration under Section 564(b)(1) of the Act, 21 U.S.C.section 360bbb-3(b)(1), unless the authorization is terminated  or revoked sooner.       Influenza A by PCR NEGATIVE NEGATIVE Final   Influenza B by PCR NEGATIVE NEGATIVE Final    Comment: (NOTE) The Xpert Xpress SARS-CoV-2/FLU/RSV plus assay is intended as an aid in the diagnosis of influenza from Nasopharyngeal swab specimens and should not be used as a sole basis for treatment. Nasal washings and aspirates are unacceptable for Xpert Xpress SARS-CoV-2/FLU/RSV testing.  Fact Sheet for Patients: EntrepreneurPulse.com.au  Fact Sheet for Healthcare Providers: IncredibleEmployment.be  This test is not yet approved or cleared by the [place] FDA and has been authorized for detection and/or diagnosis of SARS-CoV-2 by FDA under an Emergency Use Authorization (EUA). This EUA will remain in effect  (meaning this test can be used) for the duration of the COVID-19 declaration under Section 564(b)(1) of the Act, 21 U.S.C. section 360bbb-3(b)(1), unless the authorization is terminated or revoked.  Performed at [facility], [address] [place], [place] [address]   Blood culture (routine x 2)     Status: None (Preliminary result)   Collection Time: 10/24/20  4:51 PM   Specimen: BLOOD  Result Value Ref Range Status   Specimen Description BLOOD LEFT ANTECUBITAL  Final   Special Requests   Final    BOTTLES DRAWN AEROBIC AND ANAEROBIC Blood Culture results may not be optimal due to an inadequate volume of blood received in culture bottles   Culture   Final    NO GROWTH 4 DAYS Performed at [facility], [address], [place], [place] [address]    Report Status PENDING  Incomplete  Blood culture (routine x 2)     Status: None (Preliminary result)   Collection Time: 10/24/20  5:00 PM   Specimen: BLOOD  Result Value Ref Range Status   Specimen Description BLOOD BLOOD LEFT HAND  Final   Special Requests   Final    BOTTLES DRAWN AEROBIC AND ANAEROBIC Blood Culture adequate volume   Culture   Final    NO GROWTH 4 DAYS Performed at [facility], [address] [place], [place] [address]    Report Status PENDING  Incomplete  Aerobic/Anaerobic Culture w Gram Stain (surgical/deep wound)     Status: None (Preliminary result)   Collection Time: 10/26/20  8:40 AM   Specimen: PATH Digit amputation; Tissue  Result Value Ref Range Status   Specimen Description   Final    TISSUE Performed at [facility], [address] [place], [place] [address]    Special Requests   Final    RIGHT GREAT TOE Performed at [facility], [place], [place] [address]    Gram Stain NO WBC SEEN NO ORGANISMS SEEN   Final   Culture   Final    FEW STREPTOCOCCUS ANGINOSIS RARE PROTEUS MIRABILIS SUSCEPTIBILITIES TO FOLLOW CULTURE REINCUBATED  FOR BETTER GROWTH Performed at [facility], [address], [place], [place] [address]    Report Status PENDING  Incomplete   Organism ID, Bacteria STREPTOCOCCUS ANGINOSIS  Final      Susceptibility   Streptococcus anginosis - MIC*    PENICILLIN <=0.06 SENSITIVE Sensitive     CEFTRIAXONE 0.25 SENSITIVE Sensitive     ERYTHROMYCIN <=0.12 SENSITIVE Sensitive     LEVOFLOXACIN 0.5 SENSITIVE Sensitive  VANCOMYCIN 0.5 SENSITIVE Sensitive     * FEW STREPTOCOCCUS ANGINOSIS     Labs: Basic Metabolic Panel: Recent Labs  Lab 10/25/20 0407 10/26/20 0418 10/27/20 0451 10/27/20 1142 10/28/20 0507  NA 134* 135 134* 136 137  K 3.5 3.4* 5.6* 4.1 4.1  CL 102 102 106 106 106  CO2 24 24 21* 23 24  GLUCOSE 156* 133* 141* 144* 105*  BUN 27* 17 19 19 21   CREATININE 1.46* 1.05* 1.09* 1.09* 1.07*  CALCIUM 8.3* 8.6* 8.7* 8.8* 8.9  MG  --  1.6*  --   --   --   PHOS  --  2.5  --   --   --    Liver Function Tests: Recent Labs  Lab 10/24/20 1659  AST 36  ALT 28  ALKPHOS 141*  BILITOT 1.3*  PROT 8.1  ALBUMIN 3.5   No results for input(s): LIPASE, AMYLASE in the last 168 hours. No results for input(s): AMMONIA in the last 168 hours. CBC: Recent Labs  Lab 10/24/20 1504 10/25/20 0407 10/26/20 0418 10/27/20 0451 10/28/20 0507  WBC 19.3* 11.7* 6.6 8.2 8.5  NEUTROABS 16.5*  --   --   --   --   HGB 14.0 11.7* 11.8* 11.3* 11.2*  HCT 42.4 35.4* 34.9* 34.4* 34.4*  MCV 90.2 89.8 88.8 91.5 90.8  PLT 438* 275 283 250 311   Cardiac Enzymes: No results for input(s): CKTOTAL, CKMB, CKMBINDEX, TROPONINI in the last 168 hours. BNP: BNP (last 3 results) No results for input(s): BNP in the last 8760 hours.  ProBNP (last 3 results) No results for input(s): PROBNP in the last 8760 hours.  CBG: Recent Labs  Lab 10/27/20 1210 10/27/20 1742 10/27/20 2049 10/28/20 0831 10/28/20 1220  GLUCAP 130* 146* 111* 147* 98       Signed:  Desma Maxim MD.  [facility] 10/28/2020,  1:56 PM

## 2020-10-28 NOTE — Care Management Important Message (Signed)
Important Message  Patient Details  Name: Joann [name] MRN: [id number] Date of Birth: [birth year]   Medicare Important Message Given:  Yes     Juliann Pulse A Tika Hannis 10/28/2020, 9:48 AM

## 2020-10-28 NOTE — Progress Notes (Signed)
Met with the patient and her husband in the room at the bedside to discuss DC plan and needs She lives at home and is independent She has DME at home including Rolling walker, rolator, BSC.  She does not need additional DME She has used [facility] in the past and would like to use them again,  CM notified Jason at [facility] of the referral, he will call back to notify CM if [facility] can accept the patient back.

## 2020-10-28 NOTE — Progress Notes (Signed)
Pt discharged home with services, Patient A/Ox4, VS as charted, PIV removed. Discharge information and education provided to patient and spouse, discharge paperwork given to patient, verbalized understanding of discharge instructions. Pt off unit via WC accompanied by NT.  BP (!) 146/71 (BP Location: Right Arm)   Pulse 70   Temp 97.8 F (36.6 C)   Resp 16   Ht 5\' 2"  (1.575 m)   Wt 81.9 kg   SpO2 96%   BMI 33.02 kg/m   5:33 PM 10/28/2020 Pamelia Hoit Neidy Guerrieri

## 2020-10-28 NOTE — Progress Notes (Signed)
Physical Therapy Treatment Patient Details Name: Joann [name] MRN: [id number] DOB: 01-02-1946 Today's Date: 10/28/2020    History of Present Illness Pt is a 75 y.o. female presenting to ED 4/1 with R foot wound infection.  Pt admitted with complicated soft tissue infection R foot with associated acute osteomyelitis of R great toe, and sepsis.  S/p R great toe amputation at MTP joint 4/3 secondary to osteomyelitis.  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    PT Comments    Pt resting in bed upon PT arrival; pt requesting to use BSC to toilet prior to ambulation.  Pt requiring consistent vc's for R LE WB'ing status (with surgical shoe on R foot and regular shoe on L foot) with BSC transfer (and RW use).  Pt's husband had brought in wedge post op shoe (wedge in back and cut out in front to avoid forefoot WB'ing) so trialed wedge shoe on R foot and surgical shoe on L foot to attempt to improve pt's ability to maintain R LE WB'ing precautions.  With this set up, pt then CGA with transfers (use of RW), CGA with ambulation (up to 60 feet with RW), and CGA with stairs.  Pt's husband plans to provide 24/7 assist for pt (including mobility and meals) for pt's safety.  Pt and pt's husband report no questions or concerns for home discharge; pt reports feeling comfortable with use of wedge shoe R LE (secure message sent to MD Vickki Muff who cleared pt to use wedge shoe R LE if pt felt comfortable with it).     Follow Up Recommendations  Home health PT;Supervision/Assistance - 24 hour     Equipment Recommendations  Rolling walker with 5" wheels (youth sized)    Recommendations for Other Services OT consult     Precautions / Restrictions Precautions Precautions: Fall Required Braces or Orthoses: Other Brace Other Brace: surgical shoe R foot Restrictions Weight Bearing Restrictions: Yes RLE Weight Bearing:  (see restrictions below) Other  Position/Activity Restrictions: WB with assist; WB to heel; Avoid WB to forefoot; assisted WB'ing with pressure only on the heel in surgical shoe    Mobility  Bed Mobility Overal bed mobility: Modified Independent             General bed mobility comments: Semi-supine to sitting with HOB elevated without any noted difficulty    Transfers Overall transfer level: Needs assistance Equipment used: Rolling walker (2 wheeled) (youth sized) Transfers: Sit to/from [facility] Sit to Stand: Min guard Stand pivot transfers: Min guard (stand step turn bed to/from [facility] with RW)       General transfer comment: occasional vc's for UE/LE placement and positioning to maintain R LE WB'ing precautions  Ambulation/Gait Ambulation/Gait assistance: Min guard Gait Distance (Feet):  (60 feet; 30 feet (limited distance per therapist--pt able to walk further)) Assistive device: Rolling walker (2 wheeled) (youth sized)   Gait velocity: decreased   General Gait Details: step to gait pattern; initial vc's for WB'ing precautions and walker use   Stairs Stairs: Yes Stairs assistance: Min guard Stair Management: Two rails;Step to pattern;Forwards Number of Stairs: 4 General stair comments: vc's for step to pattern and LE sequencing; pt appearing strong and steady on steps with use of railings   Wheelchair Mobility    Modified Rankin (Stroke Patients Only)       Balance Overall balance assessment: Needs assistance Sitting-balance support: No upper extremity supported;Feet supported Sitting balance-Leahy Scale:  Normal Sitting balance - Comments: steady sitting reaching outside BOS   Standing balance support: No upper extremity supported Standing balance-Leahy Scale: Good Standing balance comment: steady standing adjusting mask with B hands                            Cognition Arousal/Alertness: Awake/alert Behavior During Therapy: WFL for tasks  assessed/performed Overall Cognitive Status: Within Functional Limits for tasks assessed                                        Exercises      General Comments General comments (skin integrity, edema, etc.): ace wrap bandaging/dressing to R foot (no drainage noted on dressings/ace wrap beginning/end of session).  Nursing cleared pt for participation in physical therapy.  Pt agreeable to PT session.  Pt's husband present most of therapy session.      Pertinent Vitals/Pain Pain Assessment: No/denies pain  Vitals (HR and O2 on room air) stable and WFL throughout treatment session.    Home Living                      Prior Function            PT Goals (current goals can now be found in the care plan section) Acute Rehab PT Goals Patient Stated Goal: to go home PT Goal Formulation: With patient Time For Goal Achievement: 11/10/20 Potential to Achieve Goals: Good Progress towards PT goals: Progressing toward goals    Frequency    7X/week      PT Plan Current plan remains appropriate    Co-evaluation              AM-PAC PT "6 Clicks" Mobility   Outcome Measure  Help needed turning from your back to your side while in a flat bed without using bedrails?: None Help needed moving from lying on your back to sitting on the side of a flat bed without using bedrails?: None Help needed moving to and from a bed to a chair (including a wheelchair)?: A Little Help needed standing up from a chair using your arms (e.g., wheelchair or bedside chair)?: A Little Help needed to walk in hospital room?: A Little Help needed climbing 3-5 steps with a railing? : A Little 6 Click Score: 20    End of Session Equipment Utilized During Treatment: Gait belt;Other (comment) (protective shoes) Activity Tolerance: Patient tolerated treatment well Patient left: in chair;with call bell/phone within reach;with chair alarm set;with family/visitor present;Other (comment)  (R LE elevated on 2 pillows; L LE elevated on 1 pillow) Nurse Communication: Mobility status;Precautions;Weight bearing status PT Visit Diagnosis: Other abnormalities of gait and mobility (R26.89);Muscle weakness (generalized) (M62.81);Difficulty in walking, not elsewhere classified (R26.2)     Time: 6629-4765 PT Time Calculation (min) (ACUTE ONLY): 46 min  Charges:  $Gait Training: 23-37 mins $Therapeutic Activity: 8-22 mins                     Leitha Bleak, PT 10/28/20, 10:41 AM

## 2020-10-28 NOTE — Progress Notes (Signed)
Requestion D/C of telemetry. Pt stable. No new orders

## 2020-10-29 LAB — AEROBIC/ANAEROBIC CULTURE W GRAM STAIN (SURGICAL/DEEP WOUND)

## 2020-10-29 LAB — CULTURE, BLOOD (ROUTINE X 2)
Culture: NO GROWTH
Culture: NO GROWTH
Special Requests: ADEQUATE

## 2020-11-04 LAB — AEROBIC/ANAEROBIC CULTURE W GRAM STAIN (SURGICAL/DEEP WOUND): Gram Stain: NONE SEEN

## 2020-11-11 ENCOUNTER — Other Ambulatory Visit
Admission: RE | Admit: 2020-11-11 | Discharge: 2020-11-11 | Disposition: A | Discharge status: Home / Self Care | Payer: Medicare Other | Source: Ambulatory Visit | Attending: Podiatry | Admitting: Podiatry

## 2020-11-11 DIAGNOSIS — L97509 Non-pressure chronic ulcer of other part of unspecified foot with unspecified severity: Secondary | ICD-10-CM | POA: Insufficient documentation

## 2020-11-11 DIAGNOSIS — Z89422 Acquired absence of other left toe(s): Secondary | ICD-10-CM | POA: Insufficient documentation

## 2020-11-11 DIAGNOSIS — M86171 Other acute osteomyelitis, right ankle and foot: Secondary | ICD-10-CM | POA: Diagnosis present

## 2020-11-11 DIAGNOSIS — E11621 Type 2 diabetes mellitus with foot ulcer: Secondary | ICD-10-CM | POA: Diagnosis present

## 2020-11-16 LAB — AEROBIC/ANAEROBIC CULTURE W GRAM STAIN (SURGICAL/DEEP WOUND)
Culture: NO GROWTH
Gram Stain: NONE SEEN

## 2023-02-08 ENCOUNTER — Other Ambulatory Visit: Payer: Self-pay | Admitting: Internal Medicine

## 2023-02-08 DIAGNOSIS — Z1231 Encounter for screening mammogram for malignant neoplasm of breast: Secondary | ICD-10-CM

## 2023-02-23 ENCOUNTER — Ambulatory Visit
Admission: RE | Admit: 2023-02-23 | Discharge: 2023-02-23 | Disposition: A | Discharge status: Home / Self Care | Payer: Medicare Other | Source: Ambulatory Visit | Attending: Internal Medicine | Admitting: Internal Medicine

## 2023-02-23 DIAGNOSIS — Z1231 Encounter for screening mammogram for malignant neoplasm of breast: Secondary | ICD-10-CM

## 2024-03-07 ENCOUNTER — Other Ambulatory Visit: Payer: Self-pay | Admitting: Internal Medicine

## 2024-03-07 DIAGNOSIS — Z1231 Encounter for screening mammogram for malignant neoplasm of breast: Secondary | ICD-10-CM

## 2024-03-27 ENCOUNTER — Ambulatory Visit
Admission: RE | Admit: 2024-03-27 | Discharge: 2024-03-27 | Disposition: A | Discharge status: Home / Self Care | Source: Ambulatory Visit | Attending: Internal Medicine | Admitting: Internal Medicine

## 2024-03-27 DIAGNOSIS — Z1231 Encounter for screening mammogram for malignant neoplasm of breast: Secondary | ICD-10-CM | POA: Diagnosis present

## 2024-04-08 ENCOUNTER — Other Ambulatory Visit: Payer: Self-pay

## 2024-04-08 ENCOUNTER — Inpatient Hospital Stay
Admission: EM | Admit: 2024-04-08 | Discharge: 2024-04-12 | DRG: 854 | Disposition: A | Discharge status: Home Health | Attending: Internal Medicine | Admitting: Internal Medicine

## 2024-04-08 ENCOUNTER — Emergency Department

## 2024-04-08 ENCOUNTER — Inpatient Hospital Stay

## 2024-04-08 DIAGNOSIS — I96 Gangrene, not elsewhere classified: Secondary | ICD-10-CM | POA: Diagnosis not present

## 2024-04-08 DIAGNOSIS — A4101 Sepsis due to Methicillin susceptible Staphylococcus aureus: Secondary | ICD-10-CM | POA: Diagnosis present

## 2024-04-08 DIAGNOSIS — Z7982 Long term (current) use of aspirin: Secondary | ICD-10-CM

## 2024-04-08 DIAGNOSIS — Z923 Personal history of irradiation: Secondary | ICD-10-CM

## 2024-04-08 DIAGNOSIS — E1122 Type 2 diabetes mellitus with diabetic chronic kidney disease: Secondary | ICD-10-CM | POA: Diagnosis present

## 2024-04-08 DIAGNOSIS — N1831 Chronic kidney disease, stage 3a: Secondary | ICD-10-CM | POA: Diagnosis present

## 2024-04-08 DIAGNOSIS — L02612 Cutaneous abscess of left foot: Secondary | ICD-10-CM | POA: Diagnosis present

## 2024-04-08 DIAGNOSIS — Z89422 Acquired absence of other left toe(s): Secondary | ICD-10-CM

## 2024-04-08 DIAGNOSIS — L03116 Cellulitis of left lower limb: Secondary | ICD-10-CM | POA: Diagnosis present

## 2024-04-08 DIAGNOSIS — R7401 Elevation of levels of liver transaminase levels: Secondary | ICD-10-CM | POA: Diagnosis present

## 2024-04-08 DIAGNOSIS — Z888 Allergy status to other drugs, medicaments and biological substances status: Secondary | ICD-10-CM

## 2024-04-08 DIAGNOSIS — G459 Transient cerebral ischemic attack, unspecified: Secondary | ICD-10-CM | POA: Diagnosis present

## 2024-04-08 DIAGNOSIS — Z7984 Long term (current) use of oral hypoglycemic drugs: Secondary | ICD-10-CM

## 2024-04-08 DIAGNOSIS — Z880 Allergy status to penicillin: Secondary | ICD-10-CM

## 2024-04-08 DIAGNOSIS — E1142 Type 2 diabetes mellitus with diabetic polyneuropathy: Secondary | ICD-10-CM | POA: Diagnosis present

## 2024-04-08 DIAGNOSIS — A4151 Sepsis due to Escherichia coli [E. coli]: Secondary | ICD-10-CM | POA: Diagnosis present

## 2024-04-08 DIAGNOSIS — Z7902 Long term (current) use of antithrombotics/antiplatelets: Secondary | ICD-10-CM

## 2024-04-08 DIAGNOSIS — Z820 Family history of epilepsy and other diseases of the nervous system: Secondary | ICD-10-CM

## 2024-04-08 DIAGNOSIS — E871 Hypo-osmolality and hyponatremia: Secondary | ICD-10-CM | POA: Diagnosis present

## 2024-04-08 DIAGNOSIS — R7881 Bacteremia: Secondary | ICD-10-CM | POA: Diagnosis not present

## 2024-04-08 DIAGNOSIS — Z6827 Body mass index (BMI) 27.0-27.9, adult: Secondary | ICD-10-CM

## 2024-04-08 DIAGNOSIS — E785 Hyperlipidemia, unspecified: Secondary | ICD-10-CM | POA: Diagnosis present

## 2024-04-08 DIAGNOSIS — E11628 Type 2 diabetes mellitus with other skin complications: Secondary | ICD-10-CM | POA: Diagnosis present

## 2024-04-08 DIAGNOSIS — Z9889 Other specified postprocedural states: Secondary | ICD-10-CM

## 2024-04-08 DIAGNOSIS — Z79899 Other long term (current) drug therapy: Secondary | ICD-10-CM

## 2024-04-08 DIAGNOSIS — E11621 Type 2 diabetes mellitus with foot ulcer: Secondary | ICD-10-CM | POA: Diagnosis present

## 2024-04-08 DIAGNOSIS — A419 Sepsis, unspecified organism: Principal | ICD-10-CM | POA: Diagnosis present

## 2024-04-08 DIAGNOSIS — K08109 Complete loss of teeth, unspecified cause, unspecified class: Secondary | ICD-10-CM | POA: Diagnosis present

## 2024-04-08 DIAGNOSIS — Z853 Personal history of malignant neoplasm of breast: Secondary | ICD-10-CM

## 2024-04-08 DIAGNOSIS — Z883 Allergy status to other anti-infective agents status: Secondary | ICD-10-CM

## 2024-04-08 DIAGNOSIS — E46 Unspecified protein-calorie malnutrition: Secondary | ICD-10-CM | POA: Insufficient documentation

## 2024-04-08 DIAGNOSIS — E440 Moderate protein-calorie malnutrition: Secondary | ICD-10-CM | POA: Diagnosis present

## 2024-04-08 DIAGNOSIS — K8309 Other cholangitis: Secondary | ICD-10-CM

## 2024-04-08 DIAGNOSIS — I129 Hypertensive chronic kidney disease with stage 1 through stage 4 chronic kidney disease, or unspecified chronic kidney disease: Secondary | ICD-10-CM | POA: Diagnosis present

## 2024-04-08 DIAGNOSIS — R531 Weakness: Secondary | ICD-10-CM

## 2024-04-08 DIAGNOSIS — N179 Acute kidney failure, unspecified: Secondary | ICD-10-CM | POA: Insufficient documentation

## 2024-04-08 DIAGNOSIS — Z8249 Family history of ischemic heart disease and other diseases of the circulatory system: Secondary | ICD-10-CM

## 2024-04-08 DIAGNOSIS — E876 Hypokalemia: Secondary | ICD-10-CM | POA: Diagnosis present

## 2024-04-08 DIAGNOSIS — A4159 Other Gram-negative sepsis: Secondary | ICD-10-CM | POA: Diagnosis present

## 2024-04-08 DIAGNOSIS — E869 Volume depletion, unspecified: Secondary | ICD-10-CM | POA: Diagnosis present

## 2024-04-08 DIAGNOSIS — B9561 Methicillin susceptible Staphylococcus aureus infection as the cause of diseases classified elsewhere: Secondary | ICD-10-CM

## 2024-04-08 DIAGNOSIS — G8191 Hemiplegia, unspecified affecting right dominant side: Secondary | ICD-10-CM | POA: Diagnosis present

## 2024-04-08 DIAGNOSIS — Z1152 Encounter for screening for COVID-19: Secondary | ICD-10-CM

## 2024-04-08 DIAGNOSIS — R6520 Severe sepsis without septic shock: Secondary | ICD-10-CM | POA: Diagnosis present

## 2024-04-08 DIAGNOSIS — M86172 Other acute osteomyelitis, left ankle and foot: Secondary | ICD-10-CM | POA: Diagnosis present

## 2024-04-08 DIAGNOSIS — R748 Abnormal levels of other serum enzymes: Secondary | ICD-10-CM | POA: Diagnosis present

## 2024-04-08 DIAGNOSIS — Z89411 Acquired absence of right great toe: Secondary | ICD-10-CM

## 2024-04-08 DIAGNOSIS — L97424 Non-pressure chronic ulcer of left heel and midfoot with necrosis of bone: Secondary | ICD-10-CM | POA: Diagnosis present

## 2024-04-08 DIAGNOSIS — E119 Type 2 diabetes mellitus without complications: Secondary | ICD-10-CM

## 2024-04-08 DIAGNOSIS — B964 Proteus (mirabilis) (morganii) as the cause of diseases classified elsewhere: Secondary | ICD-10-CM | POA: Diagnosis not present

## 2024-04-08 DIAGNOSIS — I6522 Occlusion and stenosis of left carotid artery: Secondary | ICD-10-CM | POA: Diagnosis present

## 2024-04-08 DIAGNOSIS — E1169 Type 2 diabetes mellitus with other specified complication: Secondary | ICD-10-CM | POA: Diagnosis present

## 2024-04-08 DIAGNOSIS — L84 Corns and callosities: Secondary | ICD-10-CM | POA: Diagnosis not present

## 2024-04-08 DIAGNOSIS — R471 Dysarthria and anarthria: Secondary | ICD-10-CM | POA: Diagnosis present

## 2024-04-08 DIAGNOSIS — D329 Benign neoplasm of meninges, unspecified: Secondary | ICD-10-CM | POA: Diagnosis present

## 2024-04-08 DIAGNOSIS — M869 Osteomyelitis, unspecified: Secondary | ICD-10-CM | POA: Diagnosis present

## 2024-04-08 DIAGNOSIS — L089 Local infection of the skin and subcutaneous tissue, unspecified: Secondary | ICD-10-CM

## 2024-04-08 LAB — CBC
HCT: 34.7 % — ABNORMAL LOW (ref 36.0–46.0)
HCT: 36.7 % (ref 36.0–46.0)
HCT: 38.5 % (ref 36.0–46.0)
Hemoglobin: 11.5 g/dL — ABNORMAL LOW (ref 12.0–15.0)
Hemoglobin: 12.2 g/dL (ref 12.0–15.0)
Hemoglobin: 13 g/dL (ref 12.0–15.0)
MCH: 29.6 pg (ref 26.0–34.0)
MCH: 29.7 pg (ref 26.0–34.0)
MCH: 29.7 pg (ref 26.0–34.0)
MCHC: 33.1 g/dL (ref 30.0–36.0)
MCHC: 33.2 g/dL (ref 30.0–36.0)
MCHC: 33.8 g/dL (ref 30.0–36.0)
MCV: 87.9 fL (ref 80.0–100.0)
MCV: 89.1 fL (ref 80.0–100.0)
MCV: 89.7 fL (ref 80.0–100.0)
Platelets: 123 K/uL — ABNORMAL LOW (ref 150–400)
Platelets: 144 K/uL — ABNORMAL LOW (ref 150–400)
Platelets: 163 K/uL (ref 150–400)
RBC: 3.87 MIL/uL (ref 3.87–5.11)
RBC: 4.12 MIL/uL (ref 3.87–5.11)
RBC: 4.38 MIL/uL (ref 3.87–5.11)
RDW: 14.4 % (ref 11.5–15.5)
RDW: 14.5 % (ref 11.5–15.5)
RDW: 14.6 % (ref 11.5–15.5)
WBC: 12.8 K/uL — ABNORMAL HIGH (ref 4.0–10.5)
WBC: 14.1 K/uL — ABNORMAL HIGH (ref 4.0–10.5)
WBC: 8.9 K/uL (ref 4.0–10.5)
nRBC: 0 % (ref 0.0–0.2)
nRBC: 0 % (ref 0.0–0.2)
nRBC: 0 % (ref 0.0–0.2)

## 2024-04-08 LAB — CREATININE, SERUM
Creatinine, Ser: 1.11 mg/dL — ABNORMAL HIGH (ref 0.44–1.00)
GFR, Estimated: 50 mL/min — ABNORMAL LOW (ref >60)

## 2024-04-08 LAB — RESP PANEL BY RT-PCR (RSV, FLU A&B, COVID)  RVPGX2
Influenza A by PCR: NEGATIVE
Influenza B by PCR: NEGATIVE
Resp Syncytial Virus by PCR: NEGATIVE
SARS Coronavirus 2 by RT PCR: NEGATIVE

## 2024-04-08 LAB — DIFFERENTIAL
Abs Immature Granulocytes: 0.07 K/uL (ref 0.00–0.07)
Basophils Absolute: 0.1 K/uL (ref 0.0–0.1)
Basophils Relative: 0 %
Eosinophils Absolute: 0.1 K/uL (ref 0.0–0.5)
Eosinophils Relative: 1 %
Immature Granulocytes: 1 %
Lymphocytes Relative: 3 %
Lymphs Abs: 0.4 K/uL — ABNORMAL LOW (ref 0.7–4.0)
Monocytes Absolute: 0.7 K/uL (ref 0.1–1.0)
Monocytes Relative: 6 %
Neutro Abs: 11.5 K/uL — ABNORMAL HIGH (ref 1.7–7.7)
Neutrophils Relative %: 89 %

## 2024-04-08 LAB — COMPREHENSIVE METABOLIC PANEL WITH GFR
ALT: 44 U/L (ref 0–44)
AST: 76 U/L — ABNORMAL HIGH (ref 15–41)
Albumin: 3 g/dL — ABNORMAL LOW (ref 3.5–5.0)
Alkaline Phosphatase: 135 U/L — ABNORMAL HIGH (ref 38–126)
Anion gap: 13 (ref 5–15)
BUN: 21 mg/dL (ref 8–23)
CO2: 27 mmol/L (ref 22–32)
Calcium: 9.5 mg/dL (ref 8.9–10.3)
Chloride: 94 mmol/L — ABNORMAL LOW (ref 98–111)
Creatinine, Ser: 1.31 mg/dL — ABNORMAL HIGH (ref 0.44–1.00)
GFR, Estimated: 41 mL/min — ABNORMAL LOW (ref >60)
Glucose, Bld: 172 mg/dL — ABNORMAL HIGH (ref 70–99)
Potassium: 3.2 mmol/L — ABNORMAL LOW (ref 3.5–5.1)
Sodium: 134 mmol/L — ABNORMAL LOW (ref 135–145)
Total Bilirubin: 2.7 mg/dL — ABNORMAL HIGH (ref 0.0–1.2)
Total Protein: 7.1 g/dL (ref 6.5–8.1)

## 2024-04-08 LAB — PROTIME-INR
INR: 1.2 (ref 0.8–1.2)
Prothrombin Time: 16.1 s — ABNORMAL HIGH (ref 11.4–15.2)

## 2024-04-08 LAB — MAGNESIUM: Magnesium: 1.4 mg/dL — ABNORMAL LOW (ref 1.7–2.4)

## 2024-04-08 LAB — APTT: aPTT: <22 s — ABNORMAL LOW (ref 24–36)

## 2024-04-08 LAB — LACTIC ACID, PLASMA: Lactic Acid, Venous: 2.7 mmol/L (ref 0.5–1.9)

## 2024-04-08 LAB — CBG MONITORING, ED: Glucose-Capillary: 179 mg/dL — ABNORMAL HIGH (ref 70–99)

## 2024-04-08 LAB — LIPASE, BLOOD: Lipase: 23 U/L (ref 11–51)

## 2024-04-08 LAB — PROCALCITONIN: Procalcitonin: 13.77 ng/mL

## 2024-04-08 LAB — ETHANOL: Alcohol, Ethyl (B): <15 mg/dL (ref <15)

## 2024-04-08 MED ORDER — ACETAMINOPHEN 650 MG RE SUPP: 650.0000 mg | RECTAL | Status: DC | PRN

## 2024-04-08 MED ORDER — ASPIRIN 81 MG PO TBEC
81.0000 mg | DELAYED_RELEASE_TABLET | Freq: Every day | ORAL | Status: DC
  Administered 2024-04-10 – 2024-04-12 (×3): 81 mg via ORAL
  Filled 2024-04-08 (×5): qty 1

## 2024-04-08 MED ORDER — SODIUM CHLORIDE 0.9 % IV SOLN
2.0000 g | INTRAVENOUS | Status: DC
  Filled 2024-04-08: qty 20

## 2024-04-08 MED ORDER — METRONIDAZOLE 500 MG/100ML IV SOLN
500.0000 mg | Freq: Two times a day (BID) | INTRAVENOUS | Status: DC
  Administered 2024-04-09 – 2024-04-11 (×7): 500 mg via INTRAVENOUS
  Filled 2024-04-08 (×9): qty 100

## 2024-04-08 MED ORDER — LACTATED RINGERS IV BOLUS
1000.0000 mL | Freq: Once | INTRAVENOUS | Status: DC
Start: 2024-04-08 — End: 2024-04-08

## 2024-04-08 MED ORDER — PIPERACILLIN-TAZOBACTAM 3.375 G IVPB
3.3750 g | Freq: Once | INTRAVENOUS | Status: AC
  Administered 2024-04-08: 3.375 g via INTRAVENOUS
  Filled 2024-04-08: qty 50

## 2024-04-08 MED ORDER — MAGNESIUM SULFATE 2 GM/50ML IV SOLN
2.0000 g | Freq: Once | INTRAVENOUS | Status: AC
  Administered 2024-04-09: 2 g via INTRAVENOUS
  Filled 2024-04-08: qty 50

## 2024-04-08 MED ORDER — INSULIN ASPART 100 UNIT/ML IJ SOLN
0.0000 [IU] | Freq: Three times a day (TID) | INTRAMUSCULAR | Status: DC
  Administered 2024-04-09 – 2024-04-10 (×4): 2 [IU] via SUBCUTANEOUS
  Administered 2024-04-11: 1 [IU] via SUBCUTANEOUS
  Administered 2024-04-11: 2 [IU] via SUBCUTANEOUS
  Administered 2024-04-12: 1 [IU] via SUBCUTANEOUS
  Filled 2024-04-08 (×7): qty 1

## 2024-04-08 MED ORDER — ONDANSETRON HCL 4 MG/2ML IJ SOLN
4.0000 mg | Freq: Once | INTRAMUSCULAR | Status: AC
  Administered 2024-04-08: 4 mg via INTRAVENOUS
  Filled 2024-04-08: qty 2

## 2024-04-08 MED ORDER — POTASSIUM CHLORIDE CRYS ER 20 MEQ PO TBCR
40.0000 meq | EXTENDED_RELEASE_TABLET | Freq: Once | ORAL | Status: AC
Start: 2024-04-08 — End: 2024-04-08
  Administered 2024-04-08: 40 meq via ORAL
  Filled 2024-04-08: qty 2

## 2024-04-08 MED ORDER — CLOPIDOGREL BISULFATE 75 MG PO TABS
75.0000 mg | ORAL_TABLET | Freq: Every day | ORAL | Status: DC
  Administered 2024-04-10 – 2024-04-12 (×3): 75 mg via ORAL
  Filled 2024-04-08 (×4): qty 1

## 2024-04-08 MED ORDER — STROKE: EARLY STAGES OF RECOVERY BOOK: Freq: Once | Status: AC

## 2024-04-08 MED ORDER — ACETAMINOPHEN 160 MG/5ML PO SOLN: 650.0000 mg | ORAL | Status: DC | PRN

## 2024-04-08 MED ORDER — VANCOMYCIN HCL 1500 MG/300ML IV SOLN
1500.0000 mg | Freq: Once | INTRAVENOUS | Status: DC
  Filled 2024-04-08: qty 300

## 2024-04-08 MED ORDER — ENOXAPARIN SODIUM 40 MG/0.4ML IJ SOSY
40.0000 mg | PREFILLED_SYRINGE | INTRAMUSCULAR | Status: DC
  Administered 2024-04-09 – 2024-04-11 (×4): 40 mg via SUBCUTANEOUS
  Filled 2024-04-08 (×4): qty 0.4

## 2024-04-08 MED ORDER — SODIUM CHLORIDE 0.9 % IV SOLN
INTRAVENOUS | Status: DC
Start: 2024-04-08 — End: 2024-04-09

## 2024-04-08 MED ORDER — VANCOMYCIN VARIABLE DOSE PER UNSTABLE RENAL FUNCTION (PHARMACIST DOSING): Status: DC

## 2024-04-08 MED ORDER — SODIUM CHLORIDE 0.9 % IV BOLUS
1000.0000 mL | Freq: Once | INTRAVENOUS | Status: AC
  Administered 2024-04-08: 1000 mL via INTRAVENOUS

## 2024-04-08 MED ORDER — SODIUM CHLORIDE 0.9 % IV SOLN
2.0000 g | Freq: Two times a day (BID) | INTRAVENOUS | Status: DC
  Administered 2024-04-09: 2 g via INTRAVENOUS
  Filled 2024-04-08 (×2): qty 12.5

## 2024-04-08 MED ORDER — INSULIN ASPART 100 UNIT/ML IJ SOLN
0.0000 [IU] | Freq: Every day | INTRAMUSCULAR | Status: DC
  Administered 2024-04-09 (×2): 2 [IU] via SUBCUTANEOUS
  Filled 2024-04-08 (×2): qty 1

## 2024-04-08 MED ORDER — LACTATED RINGERS IV SOLN: INTRAVENOUS | Status: DC

## 2024-04-08 MED ORDER — ACETAMINOPHEN 325 MG PO TABS
650.0000 mg | ORAL_TABLET | ORAL | Status: DC | PRN
  Filled 2024-04-08: qty 2

## 2024-04-08 NOTE — H&P (Addendum)
 History and Physical    Patient: Joann [name] FMW:[id number] DOB: 07/14/1946 DOA: 04/08/2024 DOS: the patient was seen and examined on 04/08/2024 PCP: Rudolpho Norleen BIRCH, MD  Patient coming from: Home  Chief Complaint:  Chief Complaint  Patient presents with   Nausea   HPI:   Joann [name] is an 78 year old female with a past medical history of type 2 diabetes mellitus hyperlipidemia hypertension who presents to the emergency department with generalized malaise and weakness, the patient reports since having her teeth extracted 7 weeks ago she has had decreased oral intake and increasing systemic symptoms.  With the patient's sister as well as husband at the bedside and assists with the history.  Interestingly while awaiting triage for the systemic symptoms the patient developed acute right sided transient weakness code stroke was called and the patient was evaluated for TIA, symptoms completely resolved.  When taking history of the patient only reports absence of fever or chills with increasing global weakness and decrease in p.o. intake had to directly ask about her left foot wound which reportedly has been present and worsening from 3 days ago which the husband stated started out as a callus the patient denies any abdominal pain.  Past medical records reviewed and summarized: Patient's Medicare annual wellness visit March 07, 2024: Comorbidities at that time type 2 diabetes mellitus hyperlipidemia hypertension history of breast cancer dermatitis, on hydrochlorothiazide  12.5 mg and metformin  as well as aspirin  potassium at that time  CT Head: 1. No acute intracranial abnormality.2. 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 Mild atrophy and white matter changes bilaterally. 4. Atherosclerotic calcifications in the cavernous carotid arteries bilaterally.  Twelve-lead ECG independently reviewed and interpreted patient has a sinus  rhythm with ventricular rate 84 PR interval 154 QTc 407 no ST changes  Review of Systems: Impaired by patient's medical status Past Medical History:  Diagnosis Date   Breast cancer (HCC) 2007   right breast lumpectomy with 36 rad tx   Breast mass 11/23/2016   6 o'clock   Diabetes mellitus without complication (HCC)    Hypertension    Past Surgical History:  Procedure Laterality Date   AMPUTATION Left 02/23/2019   Procedure: AMPUTATION LEFT 2nd Toe;  Surgeon: Ashley Soulier, DPM;  Location: [facility];  Service: Podiatry;  Laterality: Left;   AMPUTATION TOE Right 10/26/2020   Procedure: right great toe amputation;  Surgeon: Ashley Soulier, DPM;  Location: [facility];  Service: Podiatry;  Laterality: Right;   BONE EXCISION Left 05/02/2020   Procedure: BONE EXCISION;  Surgeon: Ashley Soulier, DPM;  Location: [facility];  Service: Podiatry;  Laterality: Left;   BREAST BIOPSY Right 2007   positive.  Lumpectomy with rad tx   BREAST LUMPECTOMY Right 2007   [facility] APPLICATION Left 05/02/2020   Procedure: ADJACENT SKIN TRANSFER;  Surgeon: Ashley Soulier, DPM;  Location: [facility];  Service: Podiatry;  Laterality: Left;   INCISION AND DRAINAGE Left 10/16/2016   Procedure: INCISION AND DRAINAGE;  Surgeon: Donnice Cory, DPM;  Location: [facility];  Service: Podiatry;  Laterality: Left;   IRRIGATION AND DEBRIDEMENT FOOT Left 03/02/2019   Procedure: IRRIGATION AND DEBRIDEMENT LEFT FOOT SECOND METATARSAL, DIABETIC;  Surgeon: Ashley Soulier, DPM;  Location: [facility];  Service: Podiatry;  Laterality: Left;   METATARSAL HEAD EXCISION Left 05/02/2020   Procedure: METATARSAL HEAD EXCISION 5th;  Surgeon: Ashley Soulier, DPM;  Location: [facility];  Service: Podiatry;  Laterality: Left;   Social History:  reports that  she has never smoked. She has never used smokeless tobacco. She reports that she does not drink alcohol and does not use drugs.  Allergies  Allergen Reactions   Neosporin [Neomycin-Bacitracin  Zn-Polymyx] Itching   Penicillins Itching    Has patient had a PCN reaction causing immediate rash, facial/tongue/throat swelling, SOB or lightheadedness with hypotension: no Has patient had a PCN reaction causing severe rash involving mucus membranes or skin necrosis: no Has patient had a PCN reaction that required hospitalization no Has patient had a PCN reaction occurring within the last 10 years:no If all of the above answers are NO, then may proceed with Cephalosporin use.    Statins     hallucinations    Family History  Problem Relation Age of Onset   Dementia Mother    CAD Mother    CAD Father    Deep vein thrombosis Father    Breast cancer Neg Hx     Prior to Admission medications   Medication Sig Start Date End Date Taking? Authorizing Provider  amoxicillin -clavulanate (AUGMENTIN ) 875-125 MG tablet Take 1 tablet by mouth every 12 (twelve) hours. 10/28/20   Wouk, Devaughn Sayres, MD  aspirin  EC 81 MG tablet Take 81 mg by mouth daily as needed (headaches).     [provider]  Calcium Citrate-Vitamin D (CALCIUM + D PO) Take 1 tablet by mouth daily.    [provider]  HYDROcodone -acetaminophen  (NORCO) 5-325 MG tablet Take 1 tablet by mouth every 4 (four) hours as needed for moderate pain. 05/02/20   Ashley Soulier, DPM  metFORMIN  (GLUCOPHAGE ) 500 MG tablet Take 500 mg by mouth 2 (two) times daily. 09/30/16   [provider]    Physical Exam: Vitals:   04/08/24 1800 04/08/24 1803 04/08/24 1830 04/08/24 1916  BP: (!) 75/60 115/60 (!) 101/50   Pulse: 88 86 79   Resp: (!) 23 16 (!) 31   Temp:    99.2 F (37.3 C)  TempSrc:    Oral  SpO2: 97% 95% 98%   Weight:      Height:      Patient seen approx 6:26 PM with husband at beside room 11 of ED Constitutional:  Vital Signs as per [facility] than three noted] No Acute Distress Eyes:  Pink Conjunctiva and no Ptosis Neck:     Trachea Midline, Neck Symmetric             Thyroid without tenderness,  palpable masses or nodules Respiratory:   Respiratory Effort Normal: No Use of Respiratory Muscles,No  Intercostal Retractions             Lungs Clear to Auscultation Bilaterally Cardiovascular:   Heart Auscultated: Regular Regular without any added sounds or murmurs    Patient has a ulcerated open lesion at the plantar region of her left foot in the mid region with picture taken by emergency department with purulent discharge as well as cellulitis on the dorsum of her foot, (R) great toe amputated and Left 2nd toe Gastrointestinal:  Abdomen soft and nontender without palpable masses, guarding or rebound  No Palpable Splenomegaly or Hepatomegaly Lymphatic:  No Palpable Cervical Lymphadenopathy or Palpable No Axillary Lymphadenopathy Neurologic:  II: Pupils equal and reactive,  III, IV, VI: EOM intact, no gaze preference or deviation, no nystagmus. V: normal sensation in V1, V2, and V3 segments bilaterally VII: no asymmetry, no nasolabial fold flattening XII: midline tongue protrusion MOTOR: B/L UE and LE 4/5 Power, Baseline  SENSORY: Normal to touch b/l  UE and LE Psychiatric:  Recent and Remote Memory Impaired Dysarthria which is reportedly baseline   Data Reviewed: Labs, Radiology, ECG as detailed in HPI and A/P   Assessment and Plan: * TIA (transient ischemic attack) Transient ischemic attack Presented at triage with transient loss of the right arm power now back to baseline Has chronic dysarthria at baseline and symptoms lasted less than 10 minutes Currently permissive hypertension for 48 hours pending MRI results to 220/110 Neurology has consulted with recommendations Plan: MRI brain without contrast, MRA head and neck, transthoracic echocardiogram,ASA 81mg  daily + plavix  75mg  daily x21 days f/b ASA 81mg  daily monotherapy after that, Q4H neurocheck, PT/OT / SLP, neurology consultation to Dr. Matthews, outpatient follow-up with neurology upon discharge as well, telemetry looking  for any paroxysmal A-fib  Sepsis [facility]) Concern for sepsis Possible sources being her left lower extremity wound versus potentially biliary Presenting as leukocytosis WBC 14.1 lactic acid 2.7 systemic symptoms although she denies any localizing infection signs Blood cultures ordered in the emergency department as well as vancomycin  and Zosyn  Given the nephrotoxic concern with this combination and AKI we will switch to cefepime  [correction: want to maintain pseudomonal coverage] and Flagyl  and vancomycin  pending culture directed results  Osteomyelitis (HCC) Left foot osteomyelitis, suspected Patient has chronic wound which is now burst with purulent discharge (pictures taken in the ED) X-ray without any changes, will obtain MRI to rule out osteomyelitis, also associated superficial cellulitis Not appropriate for the cellulitis order set on clinical grounds Follow-up pending cultures, patient received vancomycin  and Zosyn  in the ED Case Discussed with On Call Poldiatry Dr. Katherleen to go to [facility] given seen in last year, secure message sent to amy wilson  Plan: Podiatry consultation, MRI left foot  Biliary infection Concern for biliary source infection Patient has elevated alkaline phosphatase of 135 as well as bilirubin 2.7 as well as AST of 76 and is septic concerning for ascending biliary infection We will obtain ultrasound right upper quadrant follow-up blood cultures continue Flagyl  and ceftriaxone , acute hepatitis panel  Hypomagnesemia Hypomagnesia Likely due to her poor p.o. intake contributing to her hypokalemia magnesium  1.4 replacing IV   Hyponatremia Hyponatremia Follow-up with the fluid replacement received 1 L IV normal saline in the ED   Hypokalemia Hypokalemia Received supplementation in the emergency department will follow on repeat  Protein calorie malnutrition (HCC) Concern for risk for protein caloric deficit development  Has been unable to tolerate food after  recent teeth removal have consulted dietitian Speech has been consulted as per neurology recommendations as well  AKI (acute kidney injury) (HCC) Acute kidney injury Creatinine 1.31 up from 1.017 likely secondary to prerenal volume depletion Continue crystalloid to repeat renal function avoid nephrotoxins and will obtain retroperitoneal ultrasound to rule out any obstruction  DM2 (diabetes mellitus, type 2) (HCC) Type 2 diabetes mellitus Holding metformin  and placing on sliding scale insulin     Advance Care Planning: Full Code as per patient and family   Consults: Podiatry (Left message with [facility] podiatry answering service 8:29 PM), secure message to Greig Blush and EMR order  Family Communication: Sister and Husband at Bedside  Severity of Illness: The appropriate patient status for this patient is INPATIENT. Inpatient status is judged to be reasonable and necessary in order to provide the required intensity of service to ensure the patient's safety. The patient's presenting symptoms, physical exam findings, and initial radiographic and laboratory data in the context of their chronic comorbidities is felt to place them at high  risk for further clinical deterioration. Furthermore, it is not anticipated that the patient will be medically stable for discharge from the hospital within 2 midnights of admission.   * I certify that at the point of admission it is my clinical judgment that the patient will require inpatient hospital care spanning beyond 2 midnights from the point of admission due to high intensity of service, high risk for further deterioration and high frequency of surveillance required.*  Author: Prentice JAYSON Lowenstein, MD 04/08/2024 8:35 PM  For on call review [email or web address].

## 2024-04-08 NOTE — ED Notes (Signed)
 While this RN pushing pt back out to lobby, pt noted to be leaning to right, with right arm dangling and no movement. Family reports this is new as of now. Pt to rm 11. Code stroke called and activated with Dr Jacolyn

## 2024-04-08 NOTE — ED Notes (Signed)
 Pt able to move right arm with full range movement at this time.

## 2024-04-08 NOTE — Assessment & Plan Note (Signed)
 Type 2 diabetes mellitus Holding metformin  and placing on sliding scale insulin 

## 2024-04-08 NOTE — Assessment & Plan Note (Signed)
 Concern for risk for protein caloric deficit development  Has been unable to tolerate food after recent teeth removal have consulted dietitian Speech has been consulted as per neurology recommendations as well

## 2024-04-08 NOTE — Assessment & Plan Note (Signed)
 Left foot osteomyelitis, suspected Patient has chronic wound which is now burst with purulent discharge (pictures taken in the ED) X-ray without any changes, will obtain MRI to rule out osteomyelitis, also associated superficial cellulitis Not appropriate for the cellulitis order set on clinical grounds Follow-up pending cultures, patient received vancomycin  and Zosyn  in the ED Case Discussed with On Call Poldiatry Dr. Katherleen to go to [facility] given seen in last year, secure message sent to amy wilson (no number in amion) Plan: Podiatry consultation, MRI left foot

## 2024-04-08 NOTE — Assessment & Plan Note (Signed)
 Concern for sepsis Possible sources being her left lower extremity wound versus potentially biliary Presenting as leukocytosis WBC 14.1 lactic acid 2.7 systemic symptoms although she denies any localizing infection signs Blood cultures ordered in the emergency department as well as vancomycin  and Zosyn  Given the nephrotoxic concern with this [place] we will switch to ceftriaxone  and Flagyl  and vancomycin  pending culture directed results

## 2024-04-08 NOTE — Progress Notes (Signed)
 Pharmacy Antibiotic Note  Joann [name] is a 78 y.o. female admitted on 04/08/2024 with osteomyelitis.  Pharmacy has been consulted for Cefepime  dosing.  Plan: Cefepime  2 gm IV Q12H ordered to start on 9/15 @ 0000.  Height: 5' 3 (160 cm) Weight: 71 kg (156 lb 8.4 oz) IBW/kg (Calculated) : 52.4  Temp (24hrs), Avg:99.9 F (37.7 C), Min:99.2 F (37.3 C), Max:100.6 F (38.1 C)  Recent Labs  Lab 04/08/24 1413 04/08/24 1438 04/08/24 1621 04/08/24 1622 04/08/24 2254  WBC 14.1*  --  12.8*  --   --   CREATININE  --  1.31*  --   --  1.11*  LATICACIDVEN  --   --   --  2.7*  --     Estimated Creatinine Clearance: 38.2 mL/min (A) (by C-G formula based on SCr of 1.11 mg/dL (H)).    Allergies  Allergen Reactions   Neosporin [Neomycin-Bacitracin Zn-Polymyx] Itching   Penicillins Itching    Has patient had a PCN reaction causing immediate rash, facial/tongue/throat swelling, SOB or lightheadedness with hypotension: no Has patient had a PCN reaction causing severe rash involving mucus membranes or skin necrosis: no Has patient had a PCN reaction that required hospitalization no Has patient had a PCN reaction occurring within the last 10 years:no If all of the above answers are NO, then may proceed with Cephalosporin use.    Statins     hallucinations    Antimicrobials this admission:   >>    >>   Dose adjustments this admission:   Microbiology results:  BCx:   UCx:    Sputum:    MRSA PCR:   Thank you for allowing pharmacy to be a part of this patient's care.  Fawna Cranmer D 04/08/2024 11:47 PM

## 2024-04-08 NOTE — Assessment & Plan Note (Signed)
 Concern for biliary source infection Patient has elevated alkaline phosphatase of 135 as well as bilirubin 2.7 as well as AST of 76 and is septic concerning for ascending biliary infection We will obtain ultrasound right upper quadrant follow-up blood cultures continue Flagyl  and ceftriaxone , acute hepatitis panel

## 2024-04-08 NOTE — ED Notes (Signed)
 Called CCMD to add pt to central monitoring

## 2024-04-08 NOTE — ED Notes (Signed)
 Code stoke called at 2:28

## 2024-04-08 NOTE — ED Notes (Signed)
 Pt to MRI at this time. MRI will arrange transport to the floor.

## 2024-04-08 NOTE — Consult Note (Signed)
 CODE SEPSIS - PHARMACY COMMUNICATION  **Broad-spectrum antimicrobials should be administered within one hour of sepsis diagnosis**  Time Code Sepsis call or page was received: 1623  Antibiotics ordered: Zosyn , Vancomycin   Time of first antibiotic administration: 1708  Additional action taken by pharmacy: n/a  If necessary, name of provider/nurse contacted: n/a    Will M. Lenon, PharmD, BCPS Clinical Pharmacist 04/08/2024 4:25 PM

## 2024-04-08 NOTE — ED Notes (Signed)
 Ask to call a code stoke on this patient,called care link at 2:28

## 2024-04-08 NOTE — ED Triage Notes (Signed)
 Pt to ED with family for loss of appetite, nausea for several weeks. Family reports pt has all teeth extracted 7 weeks ago, has not been eating and getting weak with no energy.

## 2024-04-08 NOTE — Assessment & Plan Note (Signed)
 Hyponatremia Follow-up with the fluid replacement received 1 L IV normal saline in the ED

## 2024-04-08 NOTE — Plan of Care (Signed)
 Neurology brief note  Joann [name] is an 78 year old woman who initially presented to the ED today for generalized weakness and poor p.o. intake in the setting of having all of her teeth extracted 7 weeks ago.  While she was in triage she developed right arm hemiplegia  in front of the triage nurse at 2:25 PM.  Stroke code was activated.  On my examination she was nonfocal with then NIH of 1 due to chronic dysarthria.  Her symptoms resolved within 10 minutes.  Head CT showed no acute process.  TNK was not administered due to resolution of symptoms.  Patient's presentation most consistent with TIA and recommend admission for workup.  Recommendations: - Admit for stroke workup - Permissive HTN x48 hrs from sx onset or until stroke ruled out by MRI goal BP <220/110. PRN labetalol or hydralazine  if BP above these parameters. Avoid oral antihypertensives. - MRI brain wo contrast - CTA or MRA H&N - TTE - Check A1c and LDL + add statin per guidelines - ASA 81mg  daily + plavix  75mg  daily x21 days f/b ASA 81mg  daily monotherapy after that - q4 hr neuro checks - STAT head CT for any change in neuro exam - Tele - PT/OT/SLP - Stroke education - Amb referral to neurology upon discharge   Full consult note to follow. Neurology will continue to follow.  Elida Ross, MD [facility] [phone]  If 7pm- 7am, please page neurology on call as listed in AMION.

## 2024-04-08 NOTE — Progress Notes (Signed)
   04/08/24 1430  Spiritual Encounters  Type of Visit Initial  Care provided to: Pt and family  Conversation partners present during encounter Nurse  Referral source Code page  Reason for visit Code  OnCall Visit Yes   Chaplain responded to a Code Stroke page.  Family was at bedside as staff helped patient.  Chaplain offered a compassionate presence and reflective listening as patient's husband shared some of their life stories.  Patient was alert and responsive to staff's commands.  Family asked Chaplain to pray and Chaplain prayed with family.    Rev. Rana M. Nicholaus, M.Div. Chaplain Resident [facility]

## 2024-04-08 NOTE — Assessment & Plan Note (Signed)
 Hypokalemia Received supplementation in the emergency department will follow on repeat

## 2024-04-08 NOTE — Consult Note (Signed)
 Pharmacy Antibiotic Note  ASSESSMENT: 78 y.o. female with PMH including distal second metatarsal as well as osteotomy of distal fifth metatarsal is presenting with acute weakness in the setting of TIA. Pt also with plantar foot wound and leukocytosis with elevated lactate. There is concern for osteomyelitis. Pt is also currently in AKI. Pharmacy has been consulted to manage vancomycin  dosing.  PLAN: Administer vancomycin  1500mg  IV x 1 as a loading dose Estimated Cmax from load = 32 mg/L Half-life with current renal function = 23 hrs   Monitor renal function tomorrow morning to assess if able to transition to AUC dosing Follow up any culture results as applicable  Patient measurements: Height: 5' 5 (165.1 cm) Weight: 65.3 kg (144 lb) IBW/kg (Calculated) : 57  Vital signs: Temp: 99.2 F (37.3 C) (09/14 1916) Temp Source: Oral (09/14 1916) BP: 101/50 (09/14 1830) Pulse Rate: 79 (09/14 1830) Recent Labs  Lab 04/08/24 1413 04/08/24 1438 04/08/24 1621  WBC 14.1*  --  12.8*  CREATININE  --  1.31*  --    Estimated Creatinine Clearance: 30.8 mL/min (A) (by C-G formula based on SCr of 1.31 mg/dL (H)).  Allergies: Allergies  Allergen Reactions   Neosporin [Neomycin-Bacitracin Zn-Polymyx] Itching   Penicillins Itching    Has patient had a PCN reaction causing immediate rash, facial/tongue/throat swelling, SOB or lightheadedness with hypotension: no Has patient had a PCN reaction causing severe rash involving mucus membranes or skin necrosis: no Has patient had a PCN reaction that required hospitalization no Has patient had a PCN reaction occurring within the last 10 years:no If all of the above answers are NO, then may proceed with Cephalosporin use.    Statins     hallucinations    Antimicrobials this admission: Zosyn  9/14 x 1 Ceftriaxone  9/14 >> Vancomycin  9/14 >> Metronidazole  9/14 >>  Dose adjustments this admission: n/a  Microbiology results: n/a  Thank you for  allowing pharmacy to be a part of this patient's care.  Will M. Lenon, PharmD, BCPS Clinical Pharmacist 04/08/2024 8:06 PM

## 2024-04-08 NOTE — Assessment & Plan Note (Signed)
 Transient ischemic attack Presented at triage with transient loss of the right arm power now back to baseline Has chronic dysarthria at baseline and symptoms lasted less than 10 minutes Currently permissive hypertension for 48 hours pending MRI results to 220/110 Neurology has consulted with recommendations Plan: MRI brain without contrast, MRA head and neck, transthoracic echocardiogram,ASA 81mg  daily + plavix  75mg  daily x21 days f/b ASA 81mg  daily monotherapy after that, Q4H neurocheck, PT/OT / SLP, neurology consultation to Dr. Matthews, outpatient follow-up with neurology upon discharge as well, telemetry looking for any paroxysmal A-fib

## 2024-04-08 NOTE — Assessment & Plan Note (Signed)
 Acute kidney injury Creatinine 1.31 up from 1.017 likely secondary to prerenal volume depletion Continue crystalloid to repeat renal function avoid nephrotoxins and will obtain retroperitoneal ultrasound to rule out any obstruction

## 2024-04-08 NOTE — Consult Note (Signed)
 PODIATRIC SURGERY: CONSULT NOTE  Consulting Physician: Dr. Ezzard   Reason for Consult: LEFT foot cellulitis + wound    HPI: Joann [name] is a 78 y.o. female presenting to the ED for evaluation of generalized fatigue and malaise x 3 days. Notably developed TIA symptoms while awaiting triage of systemic symptoms. Found to have wound/ infection concern the LEFT foot/ lower extremity stemming from plantar lateral wound meeting sepsis criteria. Ultimately admitted for sepsis 2/2 LLE cellulitis / lower extremity infection and observation for acute onset TIA symptoms with mental status change.   [+] Drainage.  How long: 4 days ; sanguinous per patient  [-] Malodor. Denies [+] Redness. How long: 4 days [- ] Pain. How long: Denies  Onset of wound: Gradual - callus to plantar foot x 2 years ; now with acute opening over last 4 days.  Course: worsening   Aggravating factors:  Prior Treatment: Self directed  Prior Wound Care instructions: husband assisted with wound care - dressings with Ag - gauze + tape   Podiatry consulted for evaluation of wound / infection L foot.   Denies: F/C/N/V  SOB/CP/calf pain.  Last PO: Friday per patient    PMHx:  Past Medical History:  Diagnosis Date   Breast cancer (HCC) 2007   right breast lumpectomy with 36 rad tx   Breast mass 11/23/2016   6 o'clock   Diabetes mellitus without complication (HCC)    Hypertension     Surgical Hx:  Past Surgical History:  Procedure Laterality Date   AMPUTATION Left 02/23/2019   Procedure: AMPUTATION LEFT 2nd Toe;  Surgeon: Ashley Soulier, DPM;  Location: [facility];  Service: Podiatry;  Laterality: Left;   AMPUTATION TOE Right 10/26/2020   Procedure: right great toe amputation;  Surgeon: Ashley Soulier, DPM;  Location: [facility];  Service: Podiatry;  Laterality: Right;   BONE EXCISION Left 05/02/2020   Procedure: BONE EXCISION;  Surgeon: Ashley Soulier, DPM;  Location: [facility];  Service: Podiatry;  Laterality: Left;    BREAST BIOPSY Right 2007   positive.  Lumpectomy with rad tx   BREAST LUMPECTOMY Right 2007   [facility] APPLICATION Left 05/02/2020   Procedure: ADJACENT SKIN TRANSFER;  Surgeon: Ashley Soulier, DPM;  Location: [facility];  Service: Podiatry;  Laterality: Left;   INCISION AND DRAINAGE Left 10/16/2016   Procedure: INCISION AND DRAINAGE;  Surgeon: Donnice Cory, DPM;  Location: [facility];  Service: Podiatry;  Laterality: Left;   IRRIGATION AND DEBRIDEMENT FOOT Left 03/02/2019   Procedure: IRRIGATION AND DEBRIDEMENT LEFT FOOT SECOND METATARSAL, DIABETIC;  Surgeon: Ashley Soulier, DPM;  Location: [facility];  Service: Podiatry;  Laterality: Left;   METATARSAL HEAD EXCISION Left 05/02/2020   Procedure: METATARSAL HEAD EXCISION 5th;  Surgeon: Ashley Soulier, DPM;  Location: [facility];  Service: Podiatry;  Laterality: Left;    FHx:  Family History  Problem Relation Age of Onset   Dementia Mother    CAD Mother    CAD Father    Deep vein thrombosis Father    Breast cancer Neg Hx     Social History:  reports that she has never smoked. She has never used smokeless tobacco. She reports that she does not drink alcohol and does not use drugs.  Allergies:  Allergies  Allergen Reactions   Neosporin [Neomycin-Bacitracin Zn-Polymyx] Itching   Penicillins Itching    Has patient had a PCN reaction causing immediate rash, facial/tongue/throat swelling, SOB or lightheadedness with hypotension: no Has patient had a PCN reaction causing severe  rash involving mucus membranes or skin necrosis: no Has patient had a PCN reaction that required hospitalization no Has patient had a PCN reaction occurring within the last 10 years:no If all of the above answers are NO, then may proceed with Cephalosporin use.    Statins     hallucinations    (Not in a hospital admission)   Physical Exam: Blood pressure (!) 101/50, pulse 79, temperature 99.2 F (37.3 C), temperature source Oral, resp. rate (!) 31, height 5'  5 (1.651 m), weight 65.3 kg, SpO2 98%.  Constitutional: Patient appears of normal development and good nutritional status for stated age, well-groomed, and of normal body habitus. Phonating appropriately.   Psychiatric: Alert and oriented to time, place, person and situation. The patient is noted to have good judgment and insight into the hospital visit  FOCUSED LOWER EXTREMITY EXAMINATION:   Neurological:  - Protective sensation diminished / absent - Gross protective sensation diminished bilaterally.  - No focal motor or sensory deficits identified bilaterally.   Vascular:  - Dorsalis Pedis artery on palpation: diminished - 1 - Posterior Tibial artery on palpation:  diminished - 1 - Capillary Filling Times: sluggish  - Peripheral edema: 1+ - Dependency Rubor: yes  - Varicosities: mod   Musculoskeletal:  Muscle strength: 5/5 in all 4 quadrants  Ankle Joint ROM: decreased, equinus noted Global foot - TTP: none  Dermatological:  - Skin quality: atrophic  - Interdigital spaces: c/d/i   #WOUND 1   Type: Full thickness neuropathic ulceration Location: plantar lateral wound - at aspect of where 5th met head would reside.  --- Wound base: 10% Granular ; 40% Fibrosis ; 50% Necrotic  Adjacent tissue/ Wound borders: hyperkeratotic  Undermining noted (Pre-debridement): yes; Direction: circumferentially with biggest area of tracking dorsally / laterally.  PTB: + yes  Malodor: + mild  Active Drainage: no   Pre Debridement Measurements: 1.5cm (l) x 1.5cm (w) x bone (2cm)  Post Debridement Measurements: 2cm (l) x 2cm (w) x bone cm (2cm) (d) Methods for Debridement: Sharp (#15/10 blade), Mechanical (Tissue nipper / Curette) Cautery needed(?):   Debridement conducted without incident by above methods to more granular wound base tissue with necrotic / fibrotic / biofilm excised by above described methods.       Results for orders placed or performed during the hospital encounter  of 04/08/24 (from the past 48 hours)  CBC     Status: Abnormal   Collection Time: 04/08/24  2:13 PM  Result Value Ref Range   WBC 14.1 (H) 4.0 - 10.5 K/uL   RBC 4.12 3.87 - 5.11 MIL/uL   Hemoglobin 12.2 12.0 - 15.0 g/dL   HCT 63.2 63.9 - 53.9 %   MCV 89.1 80.0 - 100.0 fL   MCH 29.6 26.0 - 34.0 pg   MCHC 33.2 30.0 - 36.0 g/dL   RDW 85.5 88.4 - 84.4 %   Platelets 144 (L) 150 - 400 K/uL   nRBC 0.0 0.0 - 0.2 %    Comment: Performed at [facility], [address] [place], [place] [address]  CBG monitoring, ED     Status: Abnormal   Collection Time: 04/08/24  2:28 PM  Result Value Ref Range   Glucose-Capillary 179 (H) 70 - 99 mg/dL    Comment: Glucose reference range applies only to samples taken after fasting for at least 8 hours.  Procalcitonin     Status: None   Collection Time: 04/08/24  2:32 PM  Result Value Ref Range  Procalcitonin 13.77 ng/mL    Comment:        Interpretation: PCT >= 10 ng/mL: Important systemic inflammatory response, almost exclusively due to severe bacterial sepsis or septic shock. (NOTE)       Sepsis PCT Algorithm           Lower Respiratory Tract                                      Infection PCT Algorithm    ----------------------------     ----------------------------         PCT < 0.25 ng/mL                PCT < 0.10 ng/mL          Strongly encourage             Strongly discourage   discontinuation of antibiotics    initiation of antibiotics    ----------------------------     -----------------------------       PCT 0.25 - 0.50 ng/mL            PCT 0.10 - 0.25 ng/mL               OR       >80% decrease in PCT            Discourage initiation of                                            antibiotics      Encourage discontinuation           of antibiotics    ----------------------------     -----------------------------         PCT >= 0.50 ng/mL              PCT 0.26 - 0.50 ng/mL                AND       <80% decrease in PCT              Encourage initiation of                                             antibiotics       Encourage continuation           of antibiotics    ----------------------------     -----------------------------        PCT >= 0.50 ng/mL                  PCT > 0.50 ng/mL               AND         increase in PCT                  Strongly encourage                                      initiation of antibiotics    Strongly encourage escalation           of antibiotics                                     -----------------------------  PCT <= 0.25 ng/mL                                                 OR                                        > 80% decrease in PCT                                      Discontinue / Do not initiate                                             antibiotics  Performed at [facility], [address] [place], [place] [address]   Ethanol     Status: None   Collection Time: 04/08/24  2:38 PM  Result Value Ref Range   Alcohol, Ethyl (B) <15 <15 mg/dL    Comment: (NOTE) For medical purposes only. Performed at [facility], [address] [place], [place] [address]   Protime-INR     Status: Abnormal   Collection Time: 04/08/24  2:38 PM  Result Value Ref Range   Prothrombin Time 16.1 (H) 11.4 - 15.2 seconds   INR 1.2 0.8 - 1.2    Comment: (NOTE) INR goal varies based on device and disease states. Performed at [facility], [address] [place], [place] [address]   APTT     Status: Abnormal   Collection Time: 04/08/24  2:38 PM  Result Value Ref Range   aPTT <22 (L) 24 - 36 seconds    Comment: Performed at [facility], [address] [place], [place] [address]  Comprehensive metabolic panel     Status: Abnormal   Collection Time: 04/08/24  2:38 PM  Result Value Ref Range   Sodium 134 (L) 135 - 145 mmol/L   Potassium 3.2 (L) 3.5 - 5.1 mmol/L   Chloride 94 (L) 98 - 111 mmol/L    CO2 27 22 - 32 mmol/L   Glucose, Bld 172 (H) 70 - 99 mg/dL    Comment: Glucose reference range applies only to samples taken after fasting for at least 8 hours.   BUN 21 8 - 23 mg/dL   Creatinine, Ser 8.68 (H) 0.44 - 1.00 mg/dL   Calcium 9.5 8.9 - 89.6 mg/dL   Total Protein 7.1 6.5 - 8.1 g/dL   Albumin 3.0 (L) 3.5 - 5.0 g/dL   AST 76 (H) 15 - 41 U/L   ALT 44 0 - 44 U/L   Alkaline Phosphatase 135 (H) 38 - 126 U/L   Total Bilirubin 2.7 (H) 0.0 - 1.2 mg/dL   GFR, Estimated 41 (L) >60 mL/min    Comment: (NOTE) Calculated using the CKD-EPI Creatinine Equation (2021)    Anion gap 13 5 - 15    Comment: Performed at [facility], [address] [place], [place] [address]  Lipase, blood     Status: None   Collection Time: 04/08/24  2:38 PM  Result Value Ref Range   Lipase 23  11 - 51 U/L    Comment: Performed at [facility], [address] [place], [place] [address]  Magnesium      Status: Abnormal   Collection Time: 04/08/24  2:38 PM  Result Value Ref Range   Magnesium  1.4 (L) 1.7 - 2.4 mg/dL    Comment: Performed at [facility], [address] [place], [place] [address]  Resp panel by RT-PCR (RSV, Flu A&B, Covid) Anterior Nasal Swab     Status: None   Collection Time: 04/08/24  3:11 PM   Specimen: Anterior Nasal Swab  Result Value Ref Range   SARS Coronavirus 2 by RT PCR NEGATIVE NEGATIVE    Comment: (NOTE) SARS-CoV-2 target nucleic acids are NOT DETECTED.  The SARS-CoV-2 RNA is generally detectable in upper respiratory specimens during the acute phase of infection. The lowest concentration of SARS-CoV-2 viral copies this assay can detect is 138 copies/mL. A negative result does not preclude SARS-Cov-2 infection and should not be used as the sole basis for treatment or other patient management decisions. A negative result may occur with  improper specimen collection/handling, submission of specimen other than nasopharyngeal swab, presence of  viral mutation(s) within the areas targeted by this assay, and inadequate number of viral copies(<138 copies/mL). A negative result must be combined with clinical observations, patient history, and epidemiological information. The expected result is Negative.  Fact Sheet for Patients:  BloggerCourse.com  Fact Sheet for Healthcare Providers:  SeriousBroker.it  This test is no t yet approved or cleared by the [place]  FDA and  has been authorized for detection and/or diagnosis of SARS-CoV-2 by FDA under an Emergency Use Authorization (EUA). This EUA will remain  in effect (meaning this test can be used) for the duration of the COVID-19 declaration under Section 564(b)(1) of the Act, 21 U.S.C.section 360bbb-3(b)(1), unless the authorization is terminated  or revoked sooner.       Influenza A by PCR NEGATIVE NEGATIVE   Influenza B by PCR NEGATIVE NEGATIVE    Comment: (NOTE) The Xpert Xpress SARS-CoV-2/FLU/RSV plus assay is intended as an aid in the diagnosis of influenza from Nasopharyngeal swab specimens and should not be used as a sole basis for treatment. Nasal washings and aspirates are unacceptable for Xpert Xpress SARS-CoV-2/FLU/RSV testing.  Fact Sheet for Patients: BloggerCourse.com  Fact Sheet for Healthcare Providers: SeriousBroker.it  This test is not yet approved or cleared by the [place]  FDA and has been authorized for detection and/or diagnosis of SARS-CoV-2 by FDA under an Emergency Use Authorization (EUA). This EUA will remain in effect (meaning this test can be used) for the duration of the COVID-19 declaration under Section 564(b)(1) of the Act, 21 U.S.C. section 360bbb-3(b)(1), unless the authorization is terminated or revoked.     Resp Syncytial Virus by PCR NEGATIVE NEGATIVE    Comment: (NOTE) Fact Sheet for  Patients: BloggerCourse.com  Fact Sheet for Healthcare Providers: SeriousBroker.it  This test is not yet approved or cleared by the [place]  FDA and has been authorized for detection and/or diagnosis of SARS-CoV-2 by FDA under an Emergency Use Authorization (EUA). This EUA will remain in effect (meaning this test can be used) for the duration of the COVID-19 declaration under Section 564(b)(1) of the Act, 21 U.S.C. section 360bbb-3(b)(1), unless the authorization is terminated or revoked.  Performed at [facility], [address] [place], [place] [address]   CBC     Status: Abnormal   Collection Time: 04/08/24  4:21 PM  Result Value  Ref Range   WBC 12.8 (H) 4.0 - 10.5 K/uL   RBC 4.38 3.87 - 5.11 MIL/uL   Hemoglobin 13.0 12.0 - 15.0 g/dL   HCT 61.4 63.9 - 53.9 %   MCV 87.9 80.0 - 100.0 fL   MCH 29.7 26.0 - 34.0 pg   MCHC 33.8 30.0 - 36.0 g/dL   RDW 85.4 88.4 - 84.4 %   Platelets 163 150 - 400 K/uL   nRBC 0.0 0.0 - 0.2 %    Comment: Performed at [facility], [address] [place], [place] [address]  Differential     Status: Abnormal   Collection Time: 04/08/24  4:21 PM  Result Value Ref Range   Neutrophils Relative % 89 %   Neutro Abs 11.5 (H) 1.7 - 7.7 K/uL   Lymphocytes Relative 3 %   Lymphs Abs 0.4 (L) 0.7 - 4.0 K/uL   Monocytes Relative 6 %   Monocytes Absolute 0.7 0.1 - 1.0 K/uL   Eosinophils Relative 1 %   Eosinophils Absolute 0.1 0.0 - 0.5 K/uL   Basophils Relative 0 %   Basophils Absolute 0.1 0.0 - 0.1 K/uL   Immature Granulocytes 1 %   Abs Immature Granulocytes 0.07 0.00 - 0.07 K/uL    Comment: Performed at [facility], [address] [place], [place] [address]  Lactic acid, plasma     Status: Abnormal   Collection Time: 04/08/24  4:22 PM  Result Value Ref Range   Lactic Acid, Venous 2.7 (HH) 0.5 - 1.9 mmol/L    Comment: CRITICAL RESULT CALLED TO, READ BACK BY AND  VERIFIED WITH ASHLEY TREXLER @1656  04/08/24 MJU Performed at [facility], [address] [place], [place] [address]    US  RENAL Result Date: 04/08/2024 CLINICAL DATA:  Acute renal injury EXAM: RENAL / URINARY TRACT ULTRASOUND COMPLETE COMPARISON:  None Available. FINDINGS: Right Kidney: Renal measurements: 8.6 x 5.6 x 5.1 cm. = volume: 128 mL. Echogenicity within normal limits. No mass or hydronephrosis visualized. Left Kidney: Renal measurements: 10.2 x 5.1 x 5.4 cm. = volume: 149 mL. Echogenicity within normal limits. No mass or hydronephrosis visualized. Bladder: Appears normal for degree of bladder distention. Other: Mild splenomegaly is noted IMPRESSION: Unremarkable kidneys. Mild splenomegaly. Electronically Signed   By: Oneil Devonshire M.D.   On: 04/08/2024 20:37   US  Abdomen Limited RUQ (LIVER/GB) Result Date: 04/08/2024 CLINICAL DATA:  Elevated liver enzymes. EXAM: ULTRASOUND ABDOMEN LIMITED RIGHT UPPER QUADRANT COMPARISON:  None Available. FINDINGS: Gallbladder: No gallstones or wall thickening visualized. No sonographic Murphy sign noted by sonographer. Apparent tiny nonshadowing and nonmobile nodules measure up to 5 mm, likely polyps. Common bile duct: Diameter: 5 mm Liver: There is diffuse increased liver echogenicity most commonly seen in the setting of fatty infiltration. Superimposed inflammation or fibrosis is not excluded. Clinical correlation is recommended. Portal vein is patent on color Doppler imaging with normal direction of blood flow towards the liver. Other: None. IMPRESSION: 1. No gallstone. 2. Tiny gallbladder polyps measure up to 5 mm. 3. Fatty liver. Electronically Signed   By: Vanetta Chou M.D.   On: 04/08/2024 20:35   DG Foot Complete Left Result Date: 04/08/2024 CLINICAL DATA:  Plantar wound. EXAM: LEFT FOOT - COMPLETE 3+ VIEW COMPARISON:  Left foot radiograph dated 10/15/2016. FINDINGS: Status post amputation of the second ray at the distal second metatarsal  as well as osteotomy of the distal fifth metatarsal. No acute fracture or dislocation. The bones are osteopenic. There is ulceration  of the plantar skin at the fifth metatarsal. IMPRESSION: 1. No acute fracture or dislocation. 2. Plantar skin ulceration. Electronically Signed   By: Vanetta Chou M.D.   On: 04/08/2024 17:15   CT HEAD CODE STROKE WO CONTRAST Result Date: 04/08/2024 EXAM: CT HEAD WITHOUT CONTRAST 04/08/2024 02:51:00 PM TECHNIQUE: CT of the head was performed without the administration of intravenous contrast. Automated exposure control, iterative reconstruction, and/or weight based adjustment of the mA/kV was utilized to reduce the radiation dose to as low as reasonably achievable. COMPARISON: None available. CLINICAL HISTORY: Neuro deficit, acute, stroke suspected. Mental status change. FINDINGS: BRAIN AND VENTRICLES: A remote infarct is present in the anterior right frontal lobe with Wallerian degeneration extending inferiorly. Mild atrophy and white matter changes are present bilaterally. No acute hemorrhage. No evidence of acute infarct. No hydrocephalus. No extra-axial collection. No mass effect or midline shift. ORBITS: No acute abnormality. SINUSES: No acute abnormality. SOFT TISSUES AND SKULL: No acute soft tissue abnormality. No skull fracture. VASCULATURE: Atherosclerotic calcifications are present in the cavernous carotid arteries bilaterally. No hyperdense vessel is present. IMPRESSION: 1. No acute intracranial abnormality. 2. 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 3. Mild atrophy and white matter changes bilaterally. 4. Atherosclerotic calcifications in the cavernous carotid arteries bilaterally. Findings were communicated to Dr Matthews via the Amion system at 2:58 pm Electronically signed by: Lonni Necessary MD 04/08/2024 03:00 PM EDT RP Workstation: [id number]     Imaging:   3 views of the LEFT foot Noted s/p 2nd  and 5th partial ray resection / amputation.  On my read, there is radiolucency noted at the lateral foot - this could correlate to subcutaneous emphysema versus localized soft tissue ulceration/ abscess. Diffuse osteopenic bone with arthritic changes noted to proximal joints.   Impression: Soft tissue edema noted to the lateral foot with radiolucency that could correlate to subcutaneous emphysema versus localized abscess/ wound.   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 Bedside debridement of wound conducted at bedside 04/08/24. We discussed that patient preference would be for maximum limb salvage/ salvage of remaining structures of left foot, local wound care, and antibiotics should bone be infected. On clinical exam, wound probes to exposed bone. Discussed r/b/a to treatment options for acute cellulitis - clinical probability of osteomyelitis and necrotic wound - I answered all patient questions to the best of my ability. Patient opted to proceed with irrigation and debridement of necrotic wound. I will place orders for this process to begin - but relayed that AM evaluation may change treatment course based on the read of the MRI (pending) and clinical response to ABX. Patient is aware and agreeable.     Imaging:   3 views of the LEFT foot Noted s/p 2nd and 5th partial ray resection / amputation.  On my read, there is radiolucency noted at the lateral foot - this could correlate to subcutaneous emphysema versus localized soft tissue ulceration/ abscess. Diffuse osteopenic bone with arthritic changes noted to proximal joints.   Impression: Soft tissue edema noted to the lateral foot with radiolucency that could correlate to subcutaneous emphysema versus localized  abscess/ wound. Clinically - consistent with wound. There is also noted changes to the distal aspect of the 5th metatarsal - which could indicate  remodeling versus acute osteomyelitis.   Plan - Outlined erythema of leg 04/08/24 - to assist with eval of cellulitis response to ABX - OR case request placed - TBD 04/08/24 ; consent (verified with patient with RN at bedside - order placed) - ABX: Appreciate Medicine / ID / Pharmacy assist with antibiotic stewardship and appropriate coverage.  F/u with wound culture - obtained at bedside 04/08/24 (order placed)  F/u ESR/ CRP (order placed)  F/u MRI (pending) - Diet: NPO at mn (order placed)  - Activity: Heel touch down for transfer on the LLE in post op shoe (order placed) - Wound Care: TBD - podiatry dressed at bedside 04/08/24 - okay to reinforce prn strike though.   Meshilem Machuca KANDICE Blush 04/08/2024, 9:25 PM

## 2024-04-08 NOTE — ED Notes (Signed)
Cbg 179. 

## 2024-04-08 NOTE — ED Provider Notes (Signed)
 [facility] Provider Note    Event Date/Time   First MD Initiated Contact with Patient 04/08/24 1450     (approximate)   History   Nausea   HPI  Joann [name] is a 78 y.o. female who presents to the ED for evaluation of Nausea   Review of routine PCP visit from 1 month ago.  Of HTN and DM, HLD intolerant to statins.  CKD 3.  Patient presents to the ED with family for evaluation of 4 days of progressive generalized weakness.  We also report a more prolonged 7 weeks of poor intake after all of her teeth were pulled  Notably, while she is being triaged that she has an episode of acute weakness affecting her right side causing a code stroke to be activated.  Evaluated by neurology, improving symptoms and thought to be TIA.   Physical Exam   Triage Vital Signs: ED Triage Vitals [04/08/24 1410]  Encounter Vitals Group     BP (!) 98/55     Girls Systolic BP Percentile      Girls Diastolic BP Percentile      Boys Systolic BP Percentile      Boys Diastolic BP Percentile      Pulse Rate 98     Resp 18     Temp 99.8 F (37.7 C)     Temp Source Oral     SpO2 97 %     Weight 144 lb (65.3 kg)     Height 5' 5 (1.651 m)     Head Circumference      Peak Flow      Pain Score 0     Pain Loc      Pain Education      Exclude from Growth Chart     Most recent vital signs: Vitals:   04/08/24 1700 04/08/24 1730  BP: (!) 108/52 (!) 108/49  Pulse: 81 72  Resp: (!) 24 19  Temp:    SpO2: 98% 100%    General: Awake, no distress.  CV:  Good peripheral perfusion.  Resp:  Normal effort.  Abd:  No distention.  MSK:  No deformity noted.  Neuro:  No focal deficits appreciated. Other:  Cellulitis of the left lower extremity, streaking up to the calf and shin as pictured below.  She has an ulcerative wound to the plantar aspect of the lateral left foot, ulcer pictured below that with gentle expression there is significant bloody purulent material,  foul-smelling        ED Results / Procedures / Treatments   Labs (all labs ordered are listed, but only abnormal results are displayed) Labs Reviewed  CBC - Abnormal; Notable for the following components:      Result Value   WBC 14.1 (*)    Platelets 144 (*)    All other components within normal limits  PROTIME-INR - Abnormal; Notable for the following components:   Prothrombin Time 16.1 (*)    All other components within normal limits  APTT - Abnormal; Notable for the following components:   aPTT <22 (*)    All other components within normal limits  COMPREHENSIVE METABOLIC PANEL WITH GFR - Abnormal; Notable for the following components:   Sodium 134 (*)    Potassium 3.2 (*)    Chloride 94 (*)    Glucose, Bld 172 (*)    Creatinine, Ser 1.31 (*)    Albumin 3.0 (*)    AST 76 (*)  Alkaline Phosphatase 135 (*)    Total Bilirubin 2.7 (*)    GFR, Estimated 41 (*)    All other components within normal limits  CBC - Abnormal; Notable for the following components:   WBC 12.8 (*)    All other components within normal limits  DIFFERENTIAL - Abnormal; Notable for the following components:   Neutro Abs 11.5 (*)    Lymphs Abs 0.4 (*)    All other components within normal limits  MAGNESIUM  - Abnormal; Notable for the following components:   Magnesium  1.4 (*)    All other components within normal limits  LACTIC ACID, PLASMA - Abnormal; Notable for the following components:   Lactic Acid, Venous 2.7 (*)    All other components within normal limits  CBG MONITORING, ED - Abnormal; Notable for the following components:   Glucose-Capillary 179 (*)    All other components within normal limits  RESP PANEL BY RT-PCR (RSV, FLU A&B, COVID)  RVPGX2  CULTURE, BLOOD (ROUTINE X 2)  CULTURE, BLOOD (ROUTINE X 2)  ETHANOL  LIPASE, BLOOD  PROCALCITONIN  URINE DRUG SCREEN, QUALITATIVE ([facility] ONLY)  URINALYSIS, ROUTINE W REFLEX MICROSCOPIC  LACTIC ACID, PLASMA    EKG Sinus rhythm at a  rate of 84 bpm.  Normal axis and intervals.  No clear signs of acute ischemia.  RADIOLOGY CT head interpreted by me without evidence of acute intracranial pathology Plain film of the left foot interpreted by me without evidence of osteomyelitis or free air  Official radiology report(s): DG Foot Complete Left Result Date: 04/08/2024 CLINICAL DATA:  Plantar wound. EXAM: LEFT FOOT - COMPLETE 3+ VIEW COMPARISON:  Left foot radiograph dated 10/15/2016. 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 No acute fracture or dislocation. The bones are osteopenic. There is ulceration of the plantar skin at the fifth metatarsal. IMPRESSION: 1. No acute fracture or dislocation. 2. Plantar skin ulceration. Electronically Signed   By: Vanetta Chou M.D.   On: 04/08/2024 17:15   CT HEAD CODE STROKE WO CONTRAST Result Date: 04/08/2024 EXAM: CT HEAD WITHOUT CONTRAST 04/08/2024 02:51:00 PM TECHNIQUE: CT of the head was performed without the administration of intravenous contrast. Automated exposure control, iterative reconstruction, and/or weight based adjustment of the mA/kV was utilized to reduce the radiation dose to as low as reasonably achievable. COMPARISON: None available. CLINICAL HISTORY: Neuro deficit, acute, stroke suspected. Mental status change. FINDINGS: BRAIN AND VENTRICLES: A remote infarct is present in the anterior right frontal lobe with Wallerian degeneration extending inferiorly. Mild atrophy and white matter changes are present bilaterally. No acute hemorrhage. No evidence of acute infarct. No hydrocephalus. No extra-axial collection. No mass effect or midline shift. ORBITS: No acute abnormality. SINUSES: No acute abnormality. SOFT TISSUES AND SKULL: No acute soft tissue abnormality. No skull fracture. VASCULATURE: Atherosclerotic calcifications are present in the cavernous carotid arteries bilaterally. No  hyperdense vessel is present. IMPRESSION: 1. No acute intracranial abnormality. 2. 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 3. Mild atrophy and white matter changes bilaterally. 4. Atherosclerotic calcifications in the cavernous carotid arteries bilaterally. Findings were communicated to Dr Matthews via the Amion system at 2:58 pm Electronically signed by: Lonni Necessary MD 04/08/2024 03:00 PM EDT RP Workstation: [id number]    PROCEDURES and INTERVENTIONS:  .1-3 Lead EKG Interpretation  Performed by: Claudene Rover, MD Authorized by: Claudene Rover, MD     Interpretation: normal  ECG rate:  96   ECG rate assessment: normal     Rhythm: sinus rhythm     Ectopy: none     Conduction: normal   .Critical Care  Performed by: Claudene Rover, MD Authorized by: Claudene Rover, MD   Critical care provider statement:    Critical care time (minutes):  30   Critical care time was exclusive of:  Separately billable procedures and treating other patients   Critical care was necessary to treat or prevent imminent or life-threatening deterioration of the following conditions:  Sepsis   Critical care was time spent personally by me on the following activities:  Development of treatment plan with patient or surrogate, discussions with consultants, evaluation of patient's response to treatment, examination of patient, ordering and review of laboratory studies, ordering and review of radiographic studies, ordering and performing treatments and interventions, pulse oximetry, re-evaluation of patient's condition and review of old charts .Ultrasound ED Peripheral IV (Provider)  Date/Time: 04/08/2024 4:27 PM  Performed by: Claudene Rover, MD Authorized by: Claudene Rover, MD   Procedure details:    Indications: multiple failed IV attempts and poor IV access     Skin Prep: chlorhexidine  gluconate     Location: left cephalic v.   Angiocath:  20 G   Bedside  Ultrasound Guided: Yes     Images: not archived     Patient tolerated procedure without complications: Yes     Dressing applied: Yes     Medications  potassium chloride  SA (KLOR-CON  M) CR tablet 40 mEq (has no administration in time range)  piperacillin -tazobactam (ZOSYN ) IVPB 3.375 g (3.375 g Intravenous New Bag/Given 04/08/24 1708)  vancomycin  (VANCOREADY) IVPB 1500 mg/300 mL (has no administration in time range)  0.9 %  sodium chloride  infusion (has no administration in time range)  ondansetron  (ZOFRAN ) injection 4 mg (4 mg Intravenous Given 04/08/24 1647)  sodium chloride  0.9 % bolus 1,000 mL (1,000 mLs Intravenous New Bag/Given 04/08/24 1645)     IMPRESSION / MDM / ASSESSMENT AND PLAN / ED COURSE  I reviewed the triage vital signs and the nursing notes.  Differential diagnosis includes, but is not limited to, stroke, TIA, seizure, metabolic cephalopathy, sepsis, AKI, dehydration, symptomatic anemia  {Patient presents with symptoms of an acute illness or injury that is potentially life-threatening.  Patient presents to the ED with acutely worsening generalized weakness with signs of sepsis from cellulitis and diabetic foot infection.    Notably while being triaged she had an episode of transient right-sided weakness concerning for TIA.  Code stroke is activated and resolving symptoms by the time neurology sees her.  Reassuring CT head.  Once she is back to the room and I am able to examine her more thoroughly, she has cellulitis streaking up her left foot and ankle from an ulcerative wound to the plantar aspect of her left foot.  Ultimately with signs of severe sepsis from this.  No signs of septic shock.  Draw cultures, provide antibiotics and consult medicine for admission.  Clinical Course as of 04/08/24 1749  Sun Apr 08, 2024  1454 Consult with neurology who recommends admission for observation related to TIA. [DS]  1623 USIV [DS]  [id number] Consult with medicine who agrees to admit  [DS]    Clinical Course User Index [DS] Claudene Rover, MD     FINAL CLINICAL IMPRESSION(S) / ED DIAGNOSES   Final diagnoses:  Sepsis due to cellulitis [facility])  Generalized weakness  TIA (transient ischemic attack)  Rx / DC Orders   ED Discharge Orders     None        Note:  This document was prepared using Dragon voice recognition software and may include unintentional dictation errors.   Claudene Rover, MD 04/08/24 [phone]

## 2024-04-08 NOTE — Progress Notes (Signed)
eLINK following for sepsis protocol. ?

## 2024-04-08 NOTE — ED Notes (Signed)
 Neuro at bedside.

## 2024-04-08 NOTE — Assessment & Plan Note (Signed)
 Hypomagnesia Likely due to her poor p.o. intake contributing to her hypokalemia magnesium  1.4 replacing IV

## 2024-04-09 ENCOUNTER — Inpatient Hospital Stay: Admitting: Anesthesiology

## 2024-04-09 ENCOUNTER — Encounter: Payer: Self-pay | Admitting: Internal Medicine

## 2024-04-09 ENCOUNTER — Encounter
Admission: EM | Disposition: A | Discharge status: Home Health | Payer: Self-pay | Source: Home / Self Care | Attending: Obstetrics and Gynecology

## 2024-04-09 DIAGNOSIS — R7401 Elevation of levels of liver transaminase levels: Secondary | ICD-10-CM

## 2024-04-09 DIAGNOSIS — L84 Corns and callosities: Secondary | ICD-10-CM

## 2024-04-09 DIAGNOSIS — E440 Moderate protein-calorie malnutrition: Secondary | ICD-10-CM | POA: Insufficient documentation

## 2024-04-09 DIAGNOSIS — B9561 Methicillin susceptible Staphylococcus aureus infection as the cause of diseases classified elsewhere: Secondary | ICD-10-CM

## 2024-04-09 DIAGNOSIS — I96 Gangrene, not elsewhere classified: Secondary | ICD-10-CM

## 2024-04-09 DIAGNOSIS — R7881 Bacteremia: Secondary | ICD-10-CM

## 2024-04-09 DIAGNOSIS — L02612 Cutaneous abscess of left foot: Secondary | ICD-10-CM

## 2024-04-09 DIAGNOSIS — G459 Transient cerebral ischemic attack, unspecified: Secondary | ICD-10-CM | POA: Diagnosis not present

## 2024-04-09 HISTORY — PX: INCISION AND DRAINAGE OF WOUND: SHX1803

## 2024-04-09 LAB — LIPID PANEL
Cholesterol: 203 mg/dL — ABNORMAL HIGH (ref 0–200)
HDL: 33 mg/dL — ABNORMAL LOW (ref >40)
LDL Cholesterol: 151 mg/dL — ABNORMAL HIGH (ref 0–99)
Total CHOL/HDL Ratio: 6.2 ratio
Triglycerides: 96 mg/dL (ref <150)
VLDL: 19 mg/dL (ref 0–40)

## 2024-04-09 LAB — CBC
HCT: 33.5 % — ABNORMAL LOW (ref 36.0–46.0)
Hemoglobin: 11.1 g/dL — ABNORMAL LOW (ref 12.0–15.0)
MCH: 29.3 pg (ref 26.0–34.0)
MCHC: 33.1 g/dL (ref 30.0–36.0)
MCV: 88.4 fL (ref 80.0–100.0)
Platelets: 135 K/uL — ABNORMAL LOW (ref 150–400)
RBC: 3.79 MIL/uL — ABNORMAL LOW (ref 3.87–5.11)
RDW: 14.5 % (ref 11.5–15.5)
WBC: 8.4 K/uL (ref 4.0–10.5)
nRBC: 0 % (ref 0.0–0.2)

## 2024-04-09 LAB — URINALYSIS, COMPLETE (UACMP) WITH MICROSCOPIC
Bilirubin Urine: NEGATIVE
Glucose, UA: NEGATIVE mg/dL
Hgb urine dipstick: NEGATIVE
Ketones, ur: NEGATIVE mg/dL
Leukocytes,Ua: NEGATIVE
Nitrite: NEGATIVE
Protein, ur: NEGATIVE mg/dL
Specific Gravity, Urine: 1.019 (ref 1.005–1.030)
pH: 5 (ref 5.0–8.0)

## 2024-04-09 LAB — BLOOD CULTURE ID PANEL (REFLEXED) - BCID2

## 2024-04-09 LAB — COMPREHENSIVE METABOLIC PANEL WITH GFR
ALT: 37 U/L (ref 0–44)
AST: 51 U/L — ABNORMAL HIGH (ref 15–41)
Albumin: 2.5 g/dL — ABNORMAL LOW (ref 3.5–5.0)
Alkaline Phosphatase: 101 U/L (ref 38–126)
Anion gap: 12 (ref 5–15)
BUN: 26 mg/dL — ABNORMAL HIGH (ref 8–23)
CO2: 26 mmol/L (ref 22–32)
Calcium: 8.3 mg/dL — ABNORMAL LOW (ref 8.9–10.3)
Chloride: 100 mmol/L (ref 98–111)
Creatinine, Ser: 1.32 mg/dL — ABNORMAL HIGH (ref 0.44–1.00)
GFR, Estimated: 41 mL/min — ABNORMAL LOW (ref >60)
Glucose, Bld: 128 mg/dL — ABNORMAL HIGH (ref 70–99)
Potassium: 3.8 mmol/L (ref 3.5–5.1)
Sodium: 138 mmol/L (ref 135–145)
Total Bilirubin: 1.6 mg/dL — ABNORMAL HIGH (ref 0.0–1.2)
Total Protein: 6.1 g/dL — ABNORMAL LOW (ref 6.5–8.1)

## 2024-04-09 LAB — GLUCOSE, CAPILLARY
Glucose-Capillary: 105 mg/dL — ABNORMAL HIGH (ref 70–99)
Glucose-Capillary: 111 mg/dL — ABNORMAL HIGH (ref 70–99)
Glucose-Capillary: 158 mg/dL — ABNORMAL HIGH (ref 70–99)
Glucose-Capillary: 163 mg/dL — ABNORMAL HIGH (ref 70–99)
Glucose-Capillary: 218 mg/dL — ABNORMAL HIGH (ref 70–99)
Glucose-Capillary: 85 mg/dL (ref 70–99)

## 2024-04-09 LAB — PHOSPHORUS: Phosphorus: 3.3 mg/dL (ref 2.5–4.6)

## 2024-04-09 LAB — HEPATITIS PANEL, ACUTE
HCV Ab: NONREACTIVE
Hep A IgM: NONREACTIVE
Hep B C IgM: NONREACTIVE
Hepatitis B Surface Ag: NONREACTIVE

## 2024-04-09 LAB — MAGNESIUM: Magnesium: 2.5 mg/dL — ABNORMAL HIGH (ref 1.7–2.4)

## 2024-04-09 LAB — HEMOGLOBIN A1C
Hgb A1c MFr Bld: 5.2 % (ref 4.8–5.6)
Mean Plasma Glucose: 102.54 mg/dL

## 2024-04-09 LAB — LACTIC ACID, PLASMA: Lactic Acid, Venous: 2.2 mmol/L (ref 0.5–1.9)

## 2024-04-09 LAB — SEDIMENTATION RATE: Sed Rate: 30 mm/h (ref 0–30)

## 2024-04-09 LAB — C-REACTIVE PROTEIN: CRP: 12.5 mg/dL — ABNORMAL HIGH (ref <1.0)

## 2024-04-09 SURGERY — IRRIGATION AND DEBRIDEMENT WOUND
Anesthesia: General | Laterality: Left

## 2024-04-09 MED ORDER — BUPIVACAINE HCL (PF) 0.5 % IJ SOLN
INTRAMUSCULAR | Status: AC
  Filled 2024-04-09: qty 30

## 2024-04-09 MED ORDER — PROPOFOL 1000 MG/100ML IV EMUL
INTRAVENOUS | Status: AC
  Filled 2024-04-09: qty 100

## 2024-04-09 MED ORDER — ADULT MULTIVITAMIN W/MINERALS CH
1.0000 | ORAL_TABLET | Freq: Every day | ORAL | Status: DC
  Administered 2024-04-10 – 2024-04-12 (×3): 1 via ORAL
  Filled 2024-04-09 (×3): qty 1

## 2024-04-09 MED ORDER — KETAMINE HCL 10 MG/ML IJ SOLN
INTRAMUSCULAR | Status: DC | PRN
  Administered 2024-04-09: 10 mg via INTRAVENOUS

## 2024-04-09 MED ORDER — ENSURE PLUS HIGH PROTEIN PO LIQD
237.0000 mL | Freq: Three times a day (TID) | ORAL | Status: DC
  Administered 2024-04-10 – 2024-04-12 (×5): 237 mL via ORAL

## 2024-04-09 MED ORDER — PROPOFOL 10 MG/ML IV BOLUS
INTRAVENOUS | Status: DC | PRN
  Administered 2024-04-09: 30 mg via INTRAVENOUS

## 2024-04-09 MED ORDER — MIDAZOLAM HCL 2 MG/2ML IJ SOLN
INTRAMUSCULAR | Status: AC
  Filled 2024-04-09: qty 2

## 2024-04-09 MED ORDER — KETAMINE HCL 50 MG/5ML IJ SOSY
PREFILLED_SYRINGE | INTRAMUSCULAR | Status: AC
  Filled 2024-04-09: qty 5

## 2024-04-09 MED ORDER — MIDAZOLAM HCL 2 MG/2ML IJ SOLN
INTRAMUSCULAR | Status: DC | PRN
  Administered 2024-04-09: 2 mg via INTRAVENOUS

## 2024-04-09 MED ORDER — ZINC SULFATE 220 (50 ZN) MG PO CAPS
220.0000 mg | ORAL_CAPSULE | Freq: Every day | ORAL | Status: DC
Start: 2024-04-10 — End: 2024-04-24
  Administered 2024-04-10 – 2024-04-12 (×3): 220 mg via ORAL
  Filled 2024-04-09 (×4): qty 1

## 2024-04-09 MED ORDER — CEFAZOLIN SODIUM-DEXTROSE 2-4 GM/100ML-% IV SOLN
2.0000 g | Freq: Three times a day (TID) | INTRAVENOUS | Status: DC
  Administered 2024-04-09 – 2024-04-12 (×9): 2 g via INTRAVENOUS
  Filled 2024-04-09 (×9): qty 100

## 2024-04-09 MED ORDER — FENTANYL CITRATE (PF) 100 MCG/2ML IJ SOLN: 25.0000 ug | INTRAMUSCULAR | Status: DC | PRN

## 2024-04-09 MED ORDER — 0.9 % SODIUM CHLORIDE (POUR BTL) OPTIME
TOPICAL | Status: DC | PRN
  Administered 2024-04-09: 500 mL

## 2024-04-09 MED ORDER — VANCOMYCIN HCL 1250 MG/250ML IV SOLN
1250.0000 mg | Freq: Once | INTRAVENOUS | Status: AC
  Administered 2024-04-09: 1250 mg via INTRAVENOUS
  Filled 2024-04-09: qty 250

## 2024-04-09 MED ORDER — PHENYLEPHRINE 80 MCG/ML (10ML) SYRINGE FOR IV PUSH (FOR BLOOD PRESSURE SUPPORT)
PREFILLED_SYRINGE | INTRAVENOUS | Status: DC | PRN
  Administered 2024-04-09 (×3): 160 ug via INTRAVENOUS

## 2024-04-09 MED ORDER — LIDOCAINE HCL (PF) 1 % IJ SOLN
INTRAMUSCULAR | Status: DC | PRN
  Administered 2024-04-09: 8 mL

## 2024-04-09 MED ORDER — LIDOCAINE HCL (CARDIAC) PF 100 MG/5ML IV SOSY
PREFILLED_SYRINGE | INTRAVENOUS | Status: DC | PRN
  Administered 2024-04-09: 60 mg via INTRATRACHEAL

## 2024-04-09 MED ORDER — ONDANSETRON HCL 4 MG/2ML IJ SOLN
INTRAMUSCULAR | Status: DC | PRN
  Administered 2024-04-09: 4 mg via INTRAVENOUS

## 2024-04-09 MED ORDER — OXYCODONE HCL 5 MG/5ML PO SOLN: 5.0000 mg | Freq: Once | ORAL | Status: DC | PRN

## 2024-04-09 MED ORDER — VITAMIN C 500 MG PO TABS
500.0000 mg | ORAL_TABLET | Freq: Two times a day (BID) | ORAL | Status: DC
  Administered 2024-04-10 – 2024-04-12 (×5): 500 mg via ORAL
  Filled 2024-04-09 (×6): qty 1

## 2024-04-09 MED ORDER — EPHEDRINE SULFATE (PRESSORS) 50 MG/ML IJ SOLN
INTRAMUSCULAR | Status: DC | PRN
Start: 2024-04-09 — End: 2024-04-09
  Administered 2024-04-09 (×2): 5 mg via INTRAVENOUS

## 2024-04-09 MED ORDER — SODIUM CHLORIDE 0.9 % IR SOLN
Status: DC | PRN
Start: 2024-04-09 — End: 2024-04-09
  Administered 2024-04-09: 3000 mL

## 2024-04-09 MED ORDER — PROPOFOL 500 MG/50ML IV EMUL
INTRAVENOUS | Status: DC | PRN
  Administered 2024-04-09: 145 ug/kg/min via INTRAVENOUS

## 2024-04-09 MED ORDER — LIDOCAINE HCL (PF) 1 % IJ SOLN
INTRAMUSCULAR | Status: AC
  Filled 2024-04-09: qty 30

## 2024-04-09 MED ORDER — DEXAMETHASONE SODIUM PHOSPHATE 10 MG/ML IJ SOLN
INTRAMUSCULAR | Status: DC | PRN
  Administered 2024-04-09: 10 mg via INTRAVENOUS

## 2024-04-09 MED ORDER — SODIUM CHLORIDE 0.9 % IV SOLN: INTRAVENOUS | Status: DC

## 2024-04-09 MED ORDER — OXYCODONE HCL 5 MG PO TABS: 5.0000 mg | ORAL_TABLET | Freq: Once | ORAL | Status: DC | PRN

## 2024-04-09 SURGICAL SUPPLY — 44 items
BAG COUNTER SPONGE SURGICOUNT (BAG) ×1 IMPLANT
BLADE OSC/SAGITTAL MD 5.5X18 (BLADE) IMPLANT
BLADE SW THK.38XMED LNG THN (BLADE) IMPLANT
BNDG ELASTIC 3X5.8 VLCR NS LF (GAUZE/BANDAGES/DRESSINGS) IMPLANT
BNDG ELASTIC 4X5.8 VLCR NS LF (GAUZE/BANDAGES/DRESSINGS) IMPLANT
BNDG ESMARCH 4X12 STRL LF (GAUZE/BANDAGES/DRESSINGS) ×1 IMPLANT
BNDG GAUZE DERMACEA FLUFF 4 (GAUZE/BANDAGES/DRESSINGS) IMPLANT
BNDG STRETCH GAUZE 3IN X12FT (GAUZE/BANDAGES/DRESSINGS) IMPLANT
CNTNR URN SCR LID CUP LEK RST (MISCELLANEOUS) IMPLANT
DURAPREP 26ML APPLICATOR (WOUND CARE) ×1 IMPLANT
ELECTRODE REM PT RTRN 9FT ADLT (ELECTROSURGICAL) ×1 IMPLANT
GAUZE PACKING 0.25INX5YD STRL (GAUZE/BANDAGES/DRESSINGS) IMPLANT
GAUZE SPONGE 4X4 12PLY STRL (GAUZE/BANDAGES/DRESSINGS) ×1 IMPLANT
GAUZE STRETCH 2X75IN STRL (MISCELLANEOUS) IMPLANT
GAUZE XEROFORM 1X8 LF (GAUZE/BANDAGES/DRESSINGS) ×1 IMPLANT
GLOVE BIO SURGEON STRL SZ7 (GLOVE) ×1 IMPLANT
GLOVE BIOGEL PI IND STRL 6.5 (GLOVE) IMPLANT
GLOVE INDICATOR 7.5 STRL GRN (GLOVE) ×1 IMPLANT
GLOVE SURG SYN 6.5 PF PI (GLOVE) IMPLANT
GOWN STRL REUS W/ TWL LRG LVL3 (GOWN DISPOSABLE) ×2 IMPLANT
IV NS 1000ML BAXH (IV SOLUTION) IMPLANT
KIT TURNOVER KIT A (KITS) ×1 IMPLANT
LABEL OR SOLS (LABEL) IMPLANT
MANIFOLD NEPTUNE II (INSTRUMENTS) ×1 IMPLANT
NDL HYPO 25X1 1.5 SAFETY (NEEDLE) ×2 IMPLANT
NEEDLE HYPO 25X1 1.5 SAFETY (NEEDLE) ×2 IMPLANT
NS IRRIG 500ML POUR BTL (IV SOLUTION) ×1 IMPLANT
PACK EXTREMITY ARMC (MISCELLANEOUS) ×1 IMPLANT
PACKING GAUZE IODOFORM 1INX5YD (GAUZE/BANDAGES/DRESSINGS) IMPLANT
PAD ABD DERMACEA PRESS 5X9 (GAUZE/BANDAGES/DRESSINGS) IMPLANT
PENCIL SMOKE EVACUATOR (MISCELLANEOUS) ×1 IMPLANT
RASP SM TEAR CROSS CUT (RASP) IMPLANT
SOL .9 NS 3000ML IRR UROMATIC (IV SOLUTION) IMPLANT
SOLUTION PREP PVP 2OZ (MISCELLANEOUS) IMPLANT
STOCKINETTE STRL 6IN 960660 (GAUZE/BANDAGES/DRESSINGS) ×1 IMPLANT
SUT VIC AB 2-0 CT1 TAPERPNT 27 (SUTURE) IMPLANT
SUT VIC AB 3-0 SH 27X BRD (SUTURE) IMPLANT
SUTURE EHLN 3-0 FS-10 30 BLK (SUTURE) IMPLANT
SUTURE ETHLN 4-0 FS2 18XMF BLK (SUTURE) IMPLANT
SWAB CULTURE AMIES ANAERIB BLU (MISCELLANEOUS) IMPLANT
SYR 10ML LL (SYRINGE) ×1 IMPLANT
TIP FAN IRRIG PULSAVAC PLUS (DISPOSABLE) IMPLANT
TRAP FLUID SMOKE EVACUATOR (MISCELLANEOUS) ×1 IMPLANT
WATER STERILE IRR 500ML POUR (IV SOLUTION) ×1 IMPLANT

## 2024-04-09 NOTE — Progress Notes (Signed)
 SLP Cancellation Note  Patient Details Name: Flossie Wexler MRN: [id number] DOB: 08-Nov-1945   Cancelled treatment:       Reason Eval/Treat Not Completed: Medical issues which prohibited therapy (Pt NPO for surgery. Will continue efforts as appropriate.)  Delon Bangs, M.S., CCC-SLP Speech-Language Pathologist [facility] [phone] [place]ETTE)  Delon CHRISTELLA Bangs 04/09/2024, 7:30 AM

## 2024-04-09 NOTE — Anesthesia Postprocedure Evaluation (Signed)
 Anesthesia Post Note  Patient: Joann [name]  Procedure(s) Performed: IRRIGATION AND DEBRIDEMENT WOUND (Left)  Patient location during evaluation: PACU Anesthesia Type: General Level of consciousness: awake and alert Pain management: pain level controlled Vital Signs Assessment: post-procedure vital signs reviewed and stable Respiratory status: spontaneous breathing, nonlabored ventilation and respiratory function stable Cardiovascular status: blood pressure returned to baseline and stable Postop Assessment: no apparent nausea or vomiting Anesthetic complications: no   No notable events documented.   Last Vitals:  Vitals:   04/09/24 1400 04/09/24 1434  BP: (!) 106/51 (!) 112/52  Pulse: 82 72  Resp: 16 16  Temp: 37.2 C 36.7 C  SpO2: 95% 90%    Last Pain:  Vitals:   04/09/24 1434  TempSrc: Oral  PainSc:                  Fairy POUR Tanyon Alipio

## 2024-04-09 NOTE — Evaluation (Signed)
 Clinical/Bedside Swallow Evaluation Patient Details  Name: Joann [name] MRN: [id number] Date of Birth: [birth year]  Today's Date: 04/09/2024 Time: SLP Start Time (ACUTE ONLY): 1430 SLP Stop Time (ACUTE ONLY): 1530 SLP Time Calculation (min) (ACUTE ONLY): 60 min  Past Medical History:  Past Medical History:  Diagnosis Date   Breast cancer (HCC) 2007   right breast lumpectomy with 36 rad tx   Breast mass 11/23/2016   6 o'clock   Diabetes mellitus without complication (HCC)    Hypertension    Past Surgical History:  Past Surgical History:  Procedure Laterality Date   AMPUTATION Left 02/23/2019   Procedure: AMPUTATION LEFT 2nd Toe;  Surgeon: Ashley Soulier, DPM;  Location: [facility] ORS;  Service: Podiatry;  Laterality: Left;   AMPUTATION TOE Right 10/26/2020   Procedure: right great toe amputation;  Surgeon: Ashley Soulier, DPM;  Location: [facility] ORS;  Service: Podiatry;  Laterality: Right;   BONE EXCISION Left 05/02/2020   Procedure: BONE EXCISION;  Surgeon: Ashley Soulier, DPM;  Location: [facility] ORS;  Service: Podiatry;  Laterality: Left;   BREAST BIOPSY Right 2007   positive.  Lumpectomy with rad tx   BREAST LUMPECTOMY Right 2007   [facility] APPLICATION Left 05/02/2020   Procedure: ADJACENT SKIN TRANSFER;  Surgeon: Ashley Soulier, DPM;  Location: [facility] ORS;  Service: Podiatry;  Laterality: Left;   INCISION AND DRAINAGE Left 10/16/2016   Procedure: INCISION AND DRAINAGE;  Surgeon: Donnice Cory, DPM;  Location: [facility] ORS;  Service: Podiatry;  Laterality: Left;   IRRIGATION AND DEBRIDEMENT FOOT Left 03/02/2019   Procedure: IRRIGATION AND DEBRIDEMENT LEFT FOOT SECOND METATARSAL, DIABETIC;  Surgeon: Ashley Soulier, DPM;  Location: [facility] ORS;  Service: Podiatry;  Laterality: Left;   METATARSAL HEAD EXCISION Left 05/02/2020   Procedure: METATARSAL HEAD EXCISION 5th;  Surgeon: Ashley Soulier, DPM;  Location: [facility] ORS;  Service: Podiatry;  Laterality: Left;   HPI:  Pt is an 78 year old female  with a past medical history of type 2 diabetes mellitus, hyperlipidemia, hypertension, and Current Left foot Wound which reportedly has been present and worsening from 3 days ago, who presents to the emergency department with generalized malaise and weakness, the patient reports since having her teeth extracted 7 weeks ago she has had decreased oral intake and increasing systemic symptoms.  With the patient's sister as well as husband at the bedside and assists with the history.  Interestingly while awaiting triage for the systemic symptoms the patient developed acute right sided UE transient weakness. A code stroke was called, and the patient was evaluated for TIA, symptoms completely resolved.  Pt admittted w/ dxs including: Osteomyelitis (HCC)  Left foot osteomyelitis, suspected.  Patient has chronic wound which is now burst with purulent discharge (pictures taken in the ED); Cellulitis; AKI; Malnutrition.     MRI: 1. No acute intracranial abnormality.  2. Underlying age-related cerebral atrophy with mild-to-moderate  chronic microvascular ischemic disease, with remote right basal  ganglia lacunar infarct.  3. Few small meningiomas measuring up to 1.1 cm along the falx and  overlying the left frontal convexity as above. No associated mass  effect or edema.      Assessment / Plan / Recommendation  Clinical Impression   Pt seen for BSE s/p procedure today. Pt awake, A/O x3; verbal and able to pick from F: 2 choices food/drink she wanted. Menu provided for her and Family to choose her Dinner meal. Recently returned to room from procedure. Min decreased articulatory precision d/t Edentulous status(recent).  Afebrile. WBC WNL. O2  support post procedure.   Pt appears to present w/ functional oropharyngeal phase swallowing w/ No oropharyngeal phase dysphagia noted, No neuromuscular deficits noted. Pt is ~7 weeks s/p FULL teeth Extraction w/ intermittent tenderness attempting to eat certain foods she cannot  chew adequately any longer.  Pt consumed po trials w/ No overt, clinical s/s of aspiration during po trials. Pt appears at reduced risk for aspiration/aspiration pneumonia when following general aspiration precautions. However, pt does have challenging factors that could impact oropharyngeal swallowing to include deconditioning/weakness, current Sepsis and Osteomyelitis, malnutrition from poor appetite s/p teeth Extraction, and hospitalization. These factors can increase risk for dysphagia as well as decreased oral intake overall.   During po trials, pt consumed all consistencies w/ no overt coughing, decline in vocal quality, or change in respiratory presentation during/post trials. O2 sats 98% when checked. Oral phase appeared grossly [facility] w/ timely bolus management, mashing/gumming of trials(increased textures moistened), and control of bolus propulsion for A-P transfer for swallowing. Oral clearing achieved w/ all trial consistencies -- moistened, soft foods given.  OM Exam appeared [facility] w/ no unilateral weakness noted. Speech intelligible- speech w/ slight+ decreased articulatory precision d/t Edentulous status now. Pt fed self w/ setup and sitting up support.   Recommend a more Mech Soft (dysphagia level 3) diet consistency w/ well-Cut and moistened foods; Thin liquids -- NO straw use, pt only drinks via Cup at home w/ recent teeth Extraction. Recommend general aspiration precautions, small bites/sips slowly w/ oral clearing b/t; reduce distractions at meals. Pills 1 at a time w/ Water vs Whole in Puree for ease of swallowing if desired.  Education given on Pills in Puree; food consistencies and easy to eat options; general aspiration precautions; practice wearing her new Dentures when made to get used to them b/f using them to eat w/ and monitoring for any soreness during use to pt and Husband. Explained speech precision and articulation in setting of FULL Edentulous status now- Husband appreciative of  the information.  MD/NSG to reconsult if any new needs arise. MD/NSG updated, agreed. Recommend Dietician f/u for support. SLP Visit Diagnosis: Dysphagia, unspecified (R13.10) (recent teeth extraction- FULL Edentulous status now; weakness and deconditioning; wounds w/ Sepsis)    Aspiration Risk   (reduced when following general aspiration precautions)    Diet Recommendation   Thin;Dysphagia 3 (mechanical soft) (for ease of mashing/gumming solids/foods) = a more Mech Soft (dysphagia level 3) diet consistency w/ well-Cut and moistened foods; Thin liquids -- NO straw use, pt only drinks via Cup at home w/ recent teeth Extraction. Recommend general aspiration precautions, small bites/sips slowly w/ oral clearing b/t; reduce distractions at meals.  Medication Administration: Whole meds with liquid (vs Whole in Puree)    Other  Recommendations Recommended Consults:  (Dietician f/u) Oral Care Recommendations: Oral care BID;Oral care before and after PO;Patient independent with oral care (setup)     Assistance Recommended at Discharge  Intermittent for setup support  Functional Status Assessment Patient has had a recent decline in their functional status and demonstrates the ability to make significant improvements in function in a reasonable and predictable amount of time. (d/t teeth extraction recently)  Frequency and Duration  (n/a)   (n/a)       Prognosis Prognosis for improved oropharyngeal function: Good Barriers to Reach Goals: Time post onset Barriers/Prognosis Comment: recent teeth extraction ~7 weeks ago- FULL Edentulous status now; weakness and deconditioning; wounds w/ Sepsis      Swallow Study   General Date of  Onset: 04/08/24 HPI: Pt is an 78 year old female with a past medical history of type 2 diabetes mellitus, hyperlipidemia, hypertension, and Current Left foot Wound which reportedly has been present and worsening from 3 days ago, who presents to the emergency department with  generalized malaise and weakness, the patient reports since having her teeth extracted 7 weeks ago she has had decreased oral intake and increasing systemic symptoms.  With the patient's sister as well as husband at the bedside and assists with the history.  Interestingly while awaiting triage for the systemic symptoms the patient developed acute right sided UE transient weakness. A code stroke was called, and the patient was evaluated for TIA, symptoms completely resolved.  Pt admittted w/ dxs including: Osteomyelitis (HCC)  Left foot osteomyelitis, suspected.  Patient has chronic wound which is now burst with purulent discharge (pictures taken in the ED); Cellulitis; AKI; Malnutrition.   MRI: 1. No acute intracranial abnormality.  2. Underlying age-related cerebral atrophy with mild-to-moderate  chronic microvascular ischemic disease, with remote right basal  ganglia lacunar infarct.  3. Few small meningiomas measuring up to 1.1 cm along the falx and  overlying the left frontal convexity as above. No associated mass  effect or edema. Type of Study: Bedside Swallow Evaluation Previous Swallow Assessment: none Diet Prior to this Study: Dysphagia 2 (finely chopped);Thin liquids (Level 0) (post procedure in setting of Edentulous status) Temperature Spikes Noted: No (wbc 8.4) Respiratory Status: Nasal cannula (O2 during procedure) History of Recent Intubation: No Behavior/Cognition: Alert;Cooperative;Pleasant mood Oral Cavity Assessment: Dry Oral Care Completed by SLP: Yes Oral Cavity - Dentition: Edentulous (recent extraction (FULL) ~7-8 weeks ago; majority healed to view (pt agreed)) Vision: Functional for self-feeding Self-Feeding Abilities: Able to feed self;Needs set up Patient Positioning: Upright in bed (min-mod support for sitting up) Baseline Vocal Quality: Normal Volitional Cough: Strong Volitional Swallow: Able to elicit    Oral/Motor/Sensory Function Overall Oral Motor/Sensory Function:  Within functional limits   Ice Chips Ice chips: Within functional limits Presentation: Spoon (fed; 3 trials)   Thin Liquid Thin Liquid: Within functional limits Presentation: Self Fed;Cup (~12+ ozs) Other Comments: pt does not use straws    Nectar Thick Nectar Thick Liquid: Not tested   Honey Thick Honey Thick Liquid: Not tested   Puree Puree: Within functional limits Presentation: Self Fed;Spoon (~4 ozs)   Solid     Solid:  (WFL in setting of new Edentulous status- tolerated softened solid trials well) Presentation: Self Fed;Spoon (10+ trials) Other Comments: moistened solids        Comer Portugal, MS, CCC-SLP Speech Language Pathologist Rehab Services; [facility] [place] [phone] (ascom) Elan Brainerd 04/09/2024,3:38 PM

## 2024-04-09 NOTE — Op Note (Signed)
 FOOT AND ANKLE SURGERY  OPERATIVE REPORT  SURGEON: Yehudah Standing G. Tanda, DPM  PRE-OPERATIVE DIAGNOSIS: Abscess, left foot ; Diabetic foot infection, left foot   POST-OPERATIVE DIAGNOSIS: same  PROCEDURE(S): Irrigation and sharp excisional debridement of all nonviable soft tissue and bone down to level of bone, LEFT foot   HEMOSTASIS: None required for adequate hemostatic control. No tourniquet used.   ANESTHESIA: MAC  ESTIMATED BLOOD LOSS: 5cc  FINDING(S): 1. Tracking to the proximal lateral compartment noted with expression of 4cc purulent drainage.    PATHOLOGY/SPECIMEN(S):   ID Type Source Tests Collected by Time Destination  A : left foot deep wound Tissue Wound AEROBIC/ANAEROBIC CULTURE W GRAM STAIN (SURGICAL/DEEP WOUND) Tanda Greig MATSU, DPM 04/09/2024 1248     INDICATIONS FOR PROCEDURE:  Joann [name] is a 78 y.o. female that presented / was admitted meeting sepsis criteria with acute infection with associated clinical ulceration of the left foot. With clinical wound appearance, and acute infection both medical and surgical treatment options were presented to patient, of which the patient elected to undergo surgical intervention of incision and drainage / irrigation and debridement of all nonviable soft tissue and bone of the left foot. The procedure, alternatives, risks, and limitations in this individual case have been carefully discussed with the patient. All questions have been thoroughly answered and the patient understands the surgery indicated. The patient has requested that this surgical intervention be undertaken and a consent form was signed.   PROCEDURAL DETAILS:  The patient was brought to the operating room and was transferred to the operating table in a supine position. The anesthesiologist administered monitored anesthesia care and IV antibiotic coverage from inpatient floor dosing was confirmed. A time-out was performed per protocol. A stockinette sleeve and  overlying 18 pneumatic tourniquet was then applied to the ankle and set to as a measure of hemostasis, but was ultimately not inflated. A local infiltrative block of 8cc 1:1 Lidocaine  and Marcaine  plain was conducted for the infected portion/ skin adjacent to wound on the left foot. The lower extremity was then prepped and draped in the usual aseptic manner.   Attention was then directed to the plantar lateral foot where clinical wound was apparent approximately 2.5cm x 2.5cm x depth of bone (approximately 2cm). Wound bed noted to be 50:50 fibrotic and necrotic at the wound base with granular bed achieved from previous bedside I&D appearing to have necrosed. Sharp debridement with #15 blade as well as rongeur was conducted to remove the nonviable necrotic skin / structures apparent down to level of bone. A portion of the deep tissue was collected for culture / sensitivity of specimen. With exploration / dissection deepening, approximately 4cc of purulent drainage was expressed from the surgical site in plantar lateral direction along 5th metatarsal prior surgical site - this expression was assisted with freer exploration and went proximally to level of (approximate) 5th metatarsal midshaft. No further extensive loculations or tracking was noted past this point and extension of tracking stopped prior to entrance of adjacent foot compartment in the dorsal / medial directions. The surgical site was then irrigated with 3L NSS via pulse lavage. The remaining bone and adjacent soft tissue was absent of necrosis and evaluated to be of good viable quality. The foot was allowed to perfuse to adequately evaluate and address for any arterial or extensive bleeding - though perfusion was evident, minimal bleeding was noted and there was no need for cautery. Once satisfied with the hemostatic control and perfusion to the  foot, the surgical site was packed with iodine  soaked packing strips, then covered with 4x4 gauze,  ABD pad, Kerlix, and an ACE bandage.  Overall, the patient tolerated the procedure and anesthesia well. Patient was transferred to the recovery room with vital signs stable and appropriate vascular status noted to the left foot. Following a period of post-operative monitoring, the patient will be transferred to inpatient floor to resume medical management with the following written and oral post-operative instructions below.   POST-OPERATIVE PLAN: Patient was instructed to be heel touch down for transfer on the operative foot in a postoperative shoe.  Activity: Avoid excessive ambulation or dependent positioning of the operative extremity. Elevate as much as possible until your post-operative appointment.  DVT ppx: None indicated for this procedure. Ok to resume DVT ppx / blood thinner.

## 2024-04-09 NOTE — Progress Notes (Signed)
 PHARMACY - PHYSICIAN COMMUNICATION CRITICAL VALUE ALERT - BLOOD CULTURE IDENTIFICATION (BCID)  Joann [name] is an 78 y.o. female who presented to [facility] on 04/08/2024 with a chief complaint of weakness and decreased oral intake following teet extraction approx 7 weeks ago  Assessment:  Blood cultures from 9/14 with GPC in both bottles of one set, 2nd set is NGTD.  BCID detects MSSA.  Concern for L foot infection and possible osteomyelitis (MRI Pending)   Name of physician (or Provider) Contacted: Drs Kandis and Fayette  Current antibiotics: Vancomycin , cefepime  and metronidazole   Changes to prescribed antibiotics recommended:  Auto-ID consult  Results for orders placed or performed during the hospital encounter of 04/08/24  Blood Culture ID Panel (Reflexed) (Collected: 04/08/2024  4:10 PM)  Result Value Ref Range   Enterococcus faecalis NOT DETECTED NOT DETECTED   Enterococcus Faecium NOT DETECTED NOT DETECTED   Listeria monocytogenes NOT DETECTED NOT DETECTED   Staphylococcus species DETECTED (A) NOT DETECTED   Staphylococcus aureus (BCID) DETECTED (A) NOT DETECTED   Staphylococcus epidermidis NOT DETECTED NOT DETECTED   Staphylococcus lugdunensis NOT DETECTED NOT DETECTED   Streptococcus species NOT DETECTED NOT DETECTED   Streptococcus agalactiae NOT DETECTED NOT DETECTED   Streptococcus pneumoniae NOT DETECTED NOT DETECTED   Streptococcus pyogenes NOT DETECTED NOT DETECTED   A.calcoaceticus-baumannii NOT DETECTED NOT DETECTED   Bacteroides fragilis NOT DETECTED NOT DETECTED   Enterobacterales NOT DETECTED NOT DETECTED   Enterobacter cloacae complex NOT DETECTED NOT DETECTED   Escherichia coli NOT DETECTED NOT DETECTED   Klebsiella aerogenes NOT DETECTED NOT DETECTED   Klebsiella oxytoca NOT DETECTED NOT DETECTED   Klebsiella pneumoniae NOT DETECTED NOT DETECTED   Proteus species NOT DETECTED NOT DETECTED   Salmonella species NOT DETECTED NOT DETECTED   Serratia  marcescens NOT DETECTED NOT DETECTED   Haemophilus influenzae NOT DETECTED NOT DETECTED   Neisseria meningitidis NOT DETECTED NOT DETECTED   Pseudomonas aeruginosa NOT DETECTED NOT DETECTED   Stenotrophomonas maltophilia NOT DETECTED NOT DETECTED   Candida albicans NOT DETECTED NOT DETECTED   Candida auris NOT DETECTED NOT DETECTED   Candida glabrata NOT DETECTED NOT DETECTED   Candida krusei NOT DETECTED NOT DETECTED   Candida parapsilosis NOT DETECTED NOT DETECTED   Candida tropicalis NOT DETECTED NOT DETECTED   Cryptococcus neoformans/gattii NOT DETECTED NOT DETECTED   Meth resistant mecA/C and MREJ NOT DETECTED NOT DETECTED   Celestine Slovak, PharmD, BCPS, BCIDP Work Cell: [phone] 04/09/2024 8:13 AM

## 2024-04-09 NOTE — Anesthesia Procedure Notes (Signed)
 Procedure Name: General with mask airway Date/Time: 04/09/2024 12:35 PM  Performed by: Ledora Duncan, CRNAPre-anesthesia Checklist: Patient identified, Emergency Drugs available, Suction available and Patient being monitored Patient Re-evaluated:Patient Re-evaluated prior to induction Oxygen Delivery Method: Simple face mask Induction Type: IV induction Placement Confirmation: positive ETCO2 and breath sounds checked- equal and bilateral Dental Injury: Teeth and Oropharynx as per pre-operative assessment

## 2024-04-09 NOTE — Progress Notes (Signed)
 Initial Nutrition Assessment  DOCUMENTATION CODES:   Non-severe (moderate) malnutrition in context of chronic illness  INTERVENTION:   -Ensure Plus High Protein po TID, each supplement provides 350 kcal and 20 grams of protein  -Magic cup TID with meals, each supplement provides 290 kcal and 9 grams of protein  -Continue dysphagia 3 diet -MVI wit minerals daily -500 mg vitamin C  BID -220 mg zinc  sulfate daily x 14 days  NUTRITION DIAGNOSIS:   Moderate Malnutrition related to chronic illness as evidenced by mild fat depletion, moderate fat depletion, mild muscle depletion, moderate muscle depletion.  GOAL:   Patient will meet greater than or equal to 90% of their needs  MONITOR:   PO intake, Supplement acceptance, Diet advancement  REASON FOR ASSESSMENT:   Consult Assessment of nutrition requirement/status  ASSESSMENT:   Pt with a past medical history of type 2 diabetes mellitus hyperlipidemia hypertension who presents with generalized malaise and weakness, the patient reports since having her teeth extracted 7 weeks ago she has had decreased oral intake and increasing systemic symptoms.  Pt admitted with TIA, sepsis, and cellulitis.   9/15- s/p Procedure(s) with comments: IRRIGATION AND DEBRIDEMENT WOUND (Left), s/p BSE- dysphagia 3 diet with thin liquids  Reviewed I/O's: +100 ml x 24 hours   Spoke with pt and husband at bedside, both pleasant and in good spirits today. Pt reports not complaints, other than she is hungry and thirsty, but understand NPO for procedure today.   Pt shares that she has had poor oral intake for the past 2-3 weeks PTA secondary to teeth extractions. Pt shares that it is difficult for her to consume solid foods due to lack of teeth, but denies nay pain, swelling, or discomfort from eating. Commonly consumed foods include liquids, yogurt, jello, pudding, and meat loaf.   Pt reports intentional weight loss over the past year secondary to  increased physical activity (bed exercises). Reviewed wt hx; pt has experienced a 6.7% wt loss over the past 7 months, which is not significant for time frame.   Discussed importance of good meal and supplement intake to promote healing. Pt amenable to supplements and mechanically altered diet for ease of intake/   Medications reviewed and include plavix , lovenox  and 0.9% sodium chloride  infusion @ 100 ml/hr.   Lab Results  Component Value Date   HGBA1C 5.2 04/08/2024   PTA DM medications are 500 mg metformin  BID.   Labs reviewed: CBGS: 85-158 (inpatient orders for glycemic control are 0-9 units insulin  aspart TID with meals).    NUTRITION - FOCUSED PHYSICAL EXAM:  Flowsheet Row Most Recent Value  Orbital Region Mild depletion  Upper Arm Region Moderate depletion  Thoracic and Lumbar Region Mild depletion  Buccal Region Mild depletion  Temple Region No depletion  Clavicle Bone Region No depletion  Clavicle and Acromion Bone Region No depletion  Scapular Bone Region No depletion  Dorsal Hand Mild depletion  Patellar Region Moderate depletion  Anterior Thigh Region Moderate depletion  Posterior Calf Region Moderate depletion  Edema (RD Assessment) None  Hair Reviewed  Eyes Reviewed  Mouth Reviewed  Skin Reviewed  Nails Reviewed    Diet Order:   Diet Order             DIET DYS 3 Room service appropriate? Yes with Assist; Fluid consistency: Thin  Diet effective now                   EDUCATION NEEDS:   Education needs have been  addressed  Skin:  Skin Assessment: Skin Integrity Issues: Skin Integrity Issues:: Incisions Incisions: closed lt foot  Last BM:  Unknown  Height:   Ht Readings from Last 1 Encounters:  04/09/24 5' 3 (1.6 m)    Weight:   Wt Readings from Last 1 Encounters:  04/09/24 71 kg    Ideal Body Weight:  52.3 kg  BMI:  Body mass index is 27.73 kg/m.  Estimated Nutritional Needs:   Kcal:  1550-1750  Protein:  75-90  grams  Fluid:  1.5-1.7 L    Margery ORN, RD, LDN, CDCES Registered Dietitian III Certified Diabetes Care and Education Specialist If unable to reach this RD, please use RD Inpatient group chat on secure chat between hours of 8am-4 pm daily

## 2024-04-09 NOTE — Interval H&P Note (Signed)
 No acute changes. Patient seen and physically examined by me 04/09/2024. No changes to H&P or planned procedure.

## 2024-04-09 NOTE — Progress Notes (Signed)
 Lactic Acid 2.2 down for 2.7   Dr Cleatus notified

## 2024-04-09 NOTE — Anesthesia Preprocedure Evaluation (Signed)
 Anesthesia Evaluation  Patient identified by MRN, date of birth, ID band Patient awake    Reviewed: Allergy & Precautions, NPO status , Patient's Chart, lab work & pertinent test results  History of Anesthesia Complications Negative for: history of anesthetic complications  Airway Mallampati: III  TM Distance: <3 FB Neck ROM: full    Dental  (+) Missing   Pulmonary neg pulmonary ROS, neg shortness of breath   Pulmonary exam normal        Cardiovascular Exercise Tolerance: Good hypertension, (-) angina Normal cardiovascular exam     Neuro/Psych TIA negative psych ROS   GI/Hepatic negative GI ROS, Neg liver ROS,neg GERD  ,,  Endo/Other  diabetes    Renal/GU Renal disease  negative genitourinary   Musculoskeletal   Abdominal   Peds  Hematology negative hematology ROS (+)   Anesthesia Other Findings Past Medical History: 2007: Breast cancer (HCC)     Comment:  right breast lumpectomy with 36 rad tx 11/23/2016: Breast mass     Comment:  6 o'clock No date: Diabetes mellitus without complication (HCC) No date: Hypertension  Past Surgical History: 02/23/2019: AMPUTATION; Left     Comment:  Procedure: 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  Lumpectomy with rad tx 2007: BREAST LUMPECTOMY; Right 05/02/2020: DERMAGRAFT APPLICATION; Left     Comment:  Procedure: ADJACENT SKIN  TRANSFER;  Surgeon: Ashley Soulier, DPM;  Location: [facility];  Service: Podiatry;                Laterality: Left; 10/16/2016: INCISION AND DRAINAGE; Left     Comment:  Procedure: INCISION AND DRAINAGE;  Surgeon: Donnice Cory, DPM;  Location: [facility];  Service: Podiatry;                Laterality: Left; 03/02/2019: IRRIGATION AND DEBRIDEMENT FOOT; Left     Comment:  Procedure: IRRIGATION AND DEBRIDEMENT LEFT FOOT SECOND               METATARSAL, DIABETIC;  Surgeon: Ashley Soulier, DPM;                Location: [facility];  Service: Podiatry;  Laterality:               Left; 05/02/2020: METATARSAL HEAD EXCISION; Left     Comment:  Procedure: METATARSAL HEAD EXCISION 5th;  Surgeon:  Ashley Soulier, DPM;  Location: [facility];  Service:               Podiatry;  Laterality: Left;  BMI    Body Mass Index: 27.73 kg/m      Reproductive/Obstetrics negative OB ROS                              Anesthesia Physical Anesthesia Plan  ASA: 3  Anesthesia Plan: General   Post-op Pain Management:    Induction: Intravenous  PONV Risk Score and Plan: Propofol  infusion and TIVA  Airway Management Planned: Natural Airway and Nasal Cannula  Additional Equipment:   Intra-op Plan:   Post-operative Plan:   Informed Consent: I have reviewed the patients History and Physical, chart, labs and discussed the procedure including the risks, benefits and alternatives for the proposed anesthesia with the patient or authorized representative who has indicated his/her understanding and acceptance.     Dental Advisory Given  Plan Discussed with: Anesthesiologist, CRNA and Surgeon  Anesthesia Plan Comments: (Patient consented for risks of anesthesia including but not limited to:  - adverse reactions to medications - risk of airway placement if required - damage to eyes, teeth, lips or other oral mucosa - nerve damage due to positioning   - sore throat or hoarseness - Damage to heart, brain, nerves, lungs, other parts of body or loss of life  Patient voiced understanding and assent.)        Anesthesia Quick Evaluation

## 2024-04-09 NOTE — Progress Notes (Signed)
 NEUROLOGY CONSULT FOLLOW UP NOTE   Date of service: April 09, 2024 Patient Name: Joann [name] MRN:  [id number] DOB:  09/02/1945  Interval Hx/subjective   Husband in the room today.  Reports that she no longer has any right arm weakness and has had no recurrence of event.    Vitals   Vitals:   04/09/24 0357 04/09/24 0859 04/09/24 1219 04/09/24 1319  BP: (!) 99/58 (!) 98/50 (!) 101/54 (!) 96/33  Pulse: 64 63 (!) 58 94  Resp:  17 16 16   Temp: (!) 97.4 F (36.3 C) 98 F (36.7 C) 98 F (36.7 C) 99 F (37.2 C)  TempSrc:  Oral    SpO2: 93% 96% 97% 100%  Weight:   71 kg   Height:   5' 3 (1.6 m)      Body mass index is 27.73 kg/m.  Physical Exam   Constitutional: Appears well-developed and well-nourished.  Psych: Affect appropriate to situation.  Eyes: No scleral injection.  Head: Normocephalic.    Neurologic Examination   Neurological Examination   Mental Status: Alert and awake. Speech fluent without evidence of aphasia.  Able to follow commands Cranial Nerves: II: Visual fields grossly normal III,IV, VI: right ptosis extra-ocular motions intact bilaterally V,VII: smile symmetric, facial light touch sensation normal bilaterally VIII: hearing normal bilaterally IX,X: gag reflex present XI: bilateral shoulder shrug XII: midline tongue extension Motor: Right : Upper extremity   5/5    Left:     Upper extremity   5/5  Lower extremity   5/5     Lower extremity   5/5 Wrapping on LLE Sensory: Pinprick and light touch intact throughout, bilaterally Deep Tendon Reflexes: 2+ and symmetric throughout, excluding LLE AJ Cerebellar: normal finger-to-nose, normal rapid alternating movements and normal heel-to-shin test Gait: not tested due to safety concerns   NIHSS 0   Medications  Current Facility-Administered Medications:    0.9 %  sodium chloride  infusion, , Intravenous, Continuous, Wouk, Devaughn Sayres, MD, New Bag at 04/09/24 1230   [MAR Hold]  acetaminophen  (TYLENOL ) tablet 650 mg, 650 mg, Oral, Q4H PRN **OR** [MAR Hold] acetaminophen  (TYLENOL ) 160 MG/5ML solution 650 mg, 650 mg, Per Tube, Q4H PRN **OR** [MAR Hold] acetaminophen  (TYLENOL ) suppository 650 mg, 650 mg, Rectal, Q4H PRN, Core, Prentice BROCKS, MD   [START ON 04/10/2024] ascorbic acid  (VITAMIN C ) tablet 500 mg, 500 mg, Oral, BID, Wouk, Devaughn Sayres, MD   [facility] Hold] aspirin  EC tablet 81 mg, 81 mg, Oral, Daily, Core, Prentice BROCKS, MD   [MAR Hold] ceFEPIme  (MAXIPIME ) 2 g in sodium chloride  0.9 % 100 mL IVPB, 2 g, Intravenous, Q12H, Core, Prentice BROCKS, MD, Last Rate: 200 mL/hr at 04/09/24 0320, 2 g at 04/09/24 0320   [MAR Hold] clopidogrel  (PLAVIX ) tablet 75 mg, 75 mg, Oral, Daily, Core, Prentice BROCKS, MD   [MAR Hold] enoxaparin  (LOVENOX ) injection 40 mg, 40 mg, Subcutaneous, Q24H, Core, Prentice BROCKS, MD, 40 mg at 04/09/24 0035   [START ON 04/10/2024] feeding supplement (ENSURE PLUS HIGH PROTEIN) liquid 237 mL, 237 mL, Oral, TID BM, Wouk, Devaughn Sayres, MD   [facility] Hold] insulin  aspart (novoLOG ) injection 0-5 Units, 0-5 Units, Subcutaneous, QHS, Core, Prentice BROCKS, MD, 2 Units at 04/09/24 0035   [facility] Hold] insulin  aspart (novoLOG ) injection 0-9 Units, 0-9 Units, Subcutaneous, TID WC, Core, Prentice BROCKS, MD   [MAR Hold] metroNIDAZOLE  (FLAGYL ) IVPB 500 mg, 500 mg, Intravenous, Q12H, Core, Prentice BROCKS, MD, Last Rate: 100 mL/hr at 04/09/24 0912, 500 mg at 04/09/24  0912   [START ON 04/10/2024] multivitamin with minerals tablet 1 tablet, 1 tablet, Oral, Daily, Wouk, Devaughn Sayres, MD   [facility] Hold] vancomycin  variable dose per unstable renal function (pharmacist dosing), , Does not apply, See admin instructions, Lenon Elsie HERO, [facility]   [START ON 04/10/2024] zinc  sulfate (50mg  elemental zinc ) capsule 220 mg, 220 mg, Oral, Daily, Wouk, Devaughn Sayres, MD  Facility-Administered Medications Ordered in Other Encounters:    dexamethasone  (DECADRON ) injection, , Intravenous, Anesthesia Intra-op, Fletcher-Harrison, Tawana, CRNA, 10 mg at  04/09/24 1239   ePHEDrine  injection, , Intravenous, Anesthesia Intra-op, Fletcher-Harrison, Tawana, CRNA, 5 mg at 04/09/24 1308   ketamine  (KETALAR ) injection, , Intravenous, Anesthesia Intra-op, Fletcher-Harrison, Tawana, CRNA, 10 mg at 04/09/24 1236   lidocaine  (cardiac) 100 mg/5mL (XYLOCAINE ) injection 2%, , Tracheal Tube, Anesthesia Intra-op, Fletcher-Harrison, Tawana, CRNA, 60 mg at 04/09/24 1235   midazolam  (VERSED ) injection, , Intravenous, Anesthesia Intra-op, Fletcher-Harrison, Tawana, CRNA, 2 mg at 04/09/24 1235   ondansetron  (ZOFRAN ) injection, , Intravenous, Anesthesia Intra-op, Fletcher-Harrison, Tawana, CRNA, 4 mg at 04/09/24 1239   PHENYLephrine  80 mcg/ml in normal saline Adult IV Push Syringe (For Blood Pressure Support), , Intravenous, Anesthesia Intra-op, Fletcher-Harrison, Tawana, CRNA, 160 mcg at 04/09/24 1308   propofol  (DIPRIVAN ) 10 mg/mL bolus/IV push, , Intravenous, Anesthesia Intra-op, Fletcher-Harrison, Tawana, CRNA, 30 mg at 04/09/24 1235   propofol  (DIPRIVAN ) 500 MG/50ML infusion, , Intravenous, Continuous PRN, Fletcher-Harrison, Tawana, CRNA, Stopped at 04/09/24 1308  Labs and Diagnostic Imaging   CBC:  Recent Labs  Lab 04/08/24 1621 04/08/24 2330 04/09/24 0426  WBC 12.8* 8.9 8.4  NEUTROABS 11.5*  --   --   HGB 13.0 11.5* 11.1*  HCT 38.5 34.7* 33.5*  MCV 87.9 89.7 88.4  PLT 163 123* 135*    Basic Metabolic Panel:  Lab Results  Component Value Date   NA 138 04/09/2024   K 3.8 04/09/2024   CO2 26 04/09/2024   GLUCOSE 128 (H) 04/09/2024   BUN 26 (H) 04/09/2024   CREATININE 1.32 (H) 04/09/2024   CALCIUM  8.3 (L) 04/09/2024   GFRNONAA 41 (L) 04/09/2024   GFRAA >60 03/03/2019   Lipid Panel:  Lab Results  Component Value Date   LDLCALC 151 (H) 04/09/2024   HgbA1c:  Lab Results  Component Value Date   HGBA1C 5.2 04/08/2024   Urine Drug Screen: No results found for: LABOPIA, COCAINSCRNUR, LABBENZ, AMPHETMU, THCU, LABBARB  Alcohol Level      Component Value Date/Time   ETH <15 04/08/2024 1438   INR  Lab Results  Component Value Date   INR 1.2 04/08/2024   APTT  Lab Results  Component Value Date   APTT <22 (L) 04/08/2024   AED levels: No results found for: PHENYTOIN, ZONISAMIDE, LAMOTRIGINE, LEVETIRACETA  CT Head without contrast(Personally reviewed): CT HEAD WITHOUT CONTRAST 04/08/2024 02:51:00 PM   TECHNIQUE: CT of the head was performed without the administration of intravenous contrast. Automated exposure control, iterative reconstruction, and/or weight based adjustment of the mA/kV was utilized to reduce the radiation dose to as low as reasonably achievable.   COMPARISON: None available.   CLINICAL HISTORY: Neuro deficit, acute, stroke suspected. Mental status change.   FINDINGS:   BRAIN AND VENTRICLES: A remote infarct is present in the anterior right frontal lobe with Wallerian degeneration extending inferiorly. Mild atrophy and white matter changes are present bilaterally. No acute hemorrhage. No evidence of acute infarct. No hydrocephalus. No extra-axial collection. No mass effect or midline shift.   ORBITS: No acute abnormality.   SINUSES:  No acute abnormality.   SOFT TISSUES AND SKULL: No acute soft tissue abnormality. No skull fracture.   VASCULATURE: Atherosclerotic calcifications are present in the cavernous carotid arteries bilaterally. No hyperdense vessel is present.   IMPRESSION: 1. No acute intracranial abnormality. 2. 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 3. Mild atrophy and white matter changes bilaterally. 4. Atherosclerotic calcifications in the cavernous carotid arteries bilaterally. Findings were communicated to Dr Matthews via the Amion system at 2:58 pm  MR Angio head without contrast and Carotid Duplex BL(Personally reviewed): MRA HEAD FINDINGS:   ANTERIOR CIRCULATION:   Right ICA widely  patent the siphon without stenosis. Irregular attenuated intermittent flow seen within the visualized distal cervical left ICA, which largely occludes at the skull base. Attenuated distal reconstitution at the left carotid siphon the of the left ophthalmic artery. Right A1 segment patent. Left A1 attenuated but patent. Normal anterior communicating complex. Both ACAs patent without visible stenosis.   Right M1 segment widely patent. Atheromatous irregularity about proximal M2 branch with mild stenoses, inferior division (series 1026, image 6). Right MCA branches otherwise widely patent. Attenuated but patent flow within the left M1 segment and distal left MCA branches.   POSTERIOR CIRCULATION:   Visualized vertebral arteries patent without stenosis. Left vertebral artery dominant. Both PICA patent at their origins. Basilar patent without stenosis. Superior cerebellar arteries patent bilaterally. Both PCAs primarily supplied via the basilar. Mild atheromatous irregularity about the PCAs with associated mild bilateral P2 stenoses. PCAs remain patent to their distal aspects.   No intracranial aneurysm.   MRA NECK FINDINGS:   AORTIC ARCH: Examination mildly degraded by motion and lack of IV contrast.   Visualized aortic arch within normal limits for caliber standard branch pattern. No visible stenosis about the origin of the great vessels.   RIGHT CAROTID SYSTEM: Right common and internal carotid arteries are patent with antegrade flow. No visible dissection. Atheromatous change about the right carotid bulb/proximal right ICA with associated stenosis of up to 60 percent by NASCET criteria.   LEFT CAROTID SYSTEM: Left CCA grossly patent at its origin, and remains grossly patent to the bifurcation. Left ICA appears occluded at its origin at the bifurcation. Persisted perfusion within the left ECA and its major branches.   VERTEBRAL ARTERIES: Both vertebral arteries arise from  subclavian arteries. Left vertebral artery dominant. Vertebral arteries are patent with antegrade flow. No visible to stenosis or dissection.   Other: None.   IMPRESSION:   MRI HEAD IMPRESSION:   1. No acute intracranial abnormality. 2. 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 3. Few small meningiomas measuring up to 1.1 cm along the falx and overlying the left frontal convexity as above. No associated mass effect or edema.   MRA HEAD IMPRESSION:   1. Loss of normal flow void within the left ICA to the siphon, consistent with occlusion as seen on corresponding MRA of the neck. Distal reconstitution at the left siphon with patent but attenuated flow within the left MCA distribution. 2. Otherwise wide patency of the intracranial arterial circulation. No other hemodynamically significant or correctable stenosis.   MRA NECK IMPRESSION:   1. 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 Left ICA remains largely occluded to the skull base. 2. Atheromatous change about the right carotid bulb/proximal right ICA with associated stenosis of up to 60 percent by NASCET criteria. 3. Wide patency of both  vertebral arteries within the neck. Left vertebral artery dominant.    MRI Brain(Personally reviewed): MRI HEAD FINDINGS   Brain: Diffuse prominence of the CSF containing spaces compatible generalized cerebral atrophy. Patchy T2/FLAIR hyperintensity involving the periventricular deep white matter both cerebral hemispheres, consistent with chronic small vessel ischemic disease, mild-to-moderate in nature. Remote lacunar infarct present at the anterior right basal ganglia. Probable small remote right parietal infarct (series 12, image 32).   No evidence for acute or subacute infarct. Gray-white matter adjacent maintained. No acute or chronic intracranial hemorrhage.    1.1 cm meningioma present along the falx without significant mass effect or edema (series 6, image 18). Additional small 1 cm meningioma overlies the left frontal convexity without mass effect or edema (series 6, image 17). No other mass lesion, mass effect, or midline shift. No hydrocephalus or extra-axial fluid collection.   Vascular: Loss of normal flow void within the left ICA to the siphon, consistent with slow flow and/or occlusion. Major intracranial vascular flow voids are otherwise maintained.   Skull and upper cervical spine: Cranial junction within normal limits. Bone marrow signal intensity overall within normal limits. No scalp soft tissue abnormality.   Sinuses/Orbits: Globes orbital soft tissues within normal limits. Paranasal sinuses are largely clear. No mastoid effusion.     Assessment   Angalina Ante is a 78 y.o. female with a history of breast cancer, DM and HTN who presented with complaints of generalized weakness then developed RUE weakness in the ED.  Symptoms resolved spontaneously.  Head CT shows no acute changes.  MRI of the brain shows no acute changes as well.  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  Although this appears to be a symptomatic lesion with complete occlusion noted no surgical intervention indicated at this time.   Symptoms likely represent TIA.  Patient with stroke risk factors of HTN and DM.  BP quite low and may require some re-evaluation to determine if current medication is necessary.  A1c 7.0, LDL 151.  Recommendations  BP management.  Determine need for current antihypertensive dose Blood sugar management with target A1c<7.0 Patient with statin allergy.  Should be evaluated on an outpatient basis for alternatives to statin for LDL goal of <70. Agree with ASA and Plavix  at current dose.  Would d/c ASA after 21 days and continue Plavix  at 75mg  daily.   Frequent neuro  checks ______________________________________________________________________   Signed, Adelyne Marchese, MD [facility]

## 2024-04-09 NOTE — Consult Note (Signed)
 NEUROLOGY CONSULT NOTE   Date of service: April 09, 2024 Patient Name: Joann [name] MRN:  [id number] DOB:  August 17, 1945 Chief Complaint: RUE weakness Requesting Provider: Kandis Devaughn Sayres, MD  History of Present Illness  Joann [name] is an 78 year old woman who initially presented to the ED today for generalized weakness and poor p.o. intake in the setting of having all of her teeth extracted 7 weeks ago.  While she was in triage she developed right arm hemiplegia  in front of the triage nurse at 2:25 PM.  Stroke code was activated.  On my examination she was nonfocal with then NIH of 1 due to chronic dysarthria.  Her symptoms resolved within 10 minutes.  Head CT showed no acute process.  TNK was not administered due to resolution of symptoms.    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  Lumpectomy with rad tx   BREAST LUMPECTOMY Right 2007   [facility] APPLICATION Left 05/02/2020   Procedure: ADJACENT SKIN TRANSFER;  Surgeon: Ashley Soulier,  DPM;  Location: [facility] OR[facility];  Service: Podiatry;  Laterality: Left;   INCISION AND DRAINAGE Left 10/16/2016   Procedure: INCISION AND DRAINAGE;  Surgeon: Donnice Cory, DPM;  Location: [facility];  Service: Podiatry;  Laterality: Left;   IRRIGATION AND DEBRIDEMENT FOOT Left 03/02/2019   Procedure: IRRIGATION AND DEBRIDEMENT LEFT FOOT SECOND METATARSAL, DIABETIC;  Surgeon: Ashley Soulier, DPM;  Location: [facility];  Service: Podiatry;  Laterality: Left;   METATARSAL HEAD EXCISION Left 05/02/2020   Procedure: METATARSAL HEAD EXCISION 5th;  Surgeon: Ashley Soulier, DPM;  Location: [facility];  Service: Podiatry;  Laterality: Left;    Family History: Family History  Problem Relation Age of Onset   Dementia Mother    CAD Mother    CAD Father    Deep vein thrombosis Father    Breast cancer Neg Hx     Social History  reports that she has never smoked. She has never used smokeless tobacco. She reports that she does not drink alcohol and does not use drugs.  Allergies  Allergen Reactions   Neosporin [Neomycin-Bacitracin Zn-Polymyx] Itching   Penicillins Itching    Has patient had a PCN reaction causing immediate rash, facial/tongue/throat swelling, SOB or lightheadedness with hypotension: no Has patient had a PCN reaction causing severe rash involving mucus membranes or skin necrosis: no Has patient had a PCN reaction that required hospitalization no Has patient had a PCN reaction occurring within the last 10 years:no If all of the above answers  are NO, then may proceed with Cephalosporin use.    Statins     hallucinations    Medications   Current Facility-Administered Medications:     stroke: early stages of recovery book, , Does not apply, Once, Core, Prentice BROCKS, MD   0.9 %  sodium chloride  infusion, , Intravenous, Continuous, Core, Prentice BROCKS, MD, Last Rate: 150 mL/hr at April 18, 2024 1916, New Bag at 04-18-2024 1916   acetaminophen  (TYLENOL ) tablet 650 mg, 650 mg, Oral, Q4H PRN **OR** acetaminophen   (TYLENOL ) 160 MG/5ML solution 650 mg, 650 mg, Per Tube, Q4H PRN **OR** acetaminophen  (TYLENOL ) suppository 650 mg, 650 mg, Rectal, Q4H PRN, Core, Prentice BROCKS, MD   aspirin  EC tablet 81 mg, 81 mg, Oral, Daily, Core, Prentice BROCKS, MD   ceFEPIme  (MAXIPIME ) 2 g in sodium chloride  0.9 % 100 mL IVPB, 2 g, Intravenous, Q12H, Core, Prentice BROCKS, MD, Last Rate: 200 mL/hr at 04/09/24 0320, 2 g at 04/09/24 0320   clopidogrel  (PLAVIX ) tablet 75 mg, 75 mg, Oral, Daily, Core, Prentice BROCKS, MD   enoxaparin  (LOVENOX ) injection 40 mg, 40 mg, Subcutaneous, Q24H, Core, Prentice BROCKS, MD, 40 mg at 04/09/24 9964   insulin  aspart (novoLOG ) injection 0-5 Units, 0-5 Units, Subcutaneous, QHS, Core, Prentice BROCKS, MD, 2 Units at 04/09/24 0035   insulin  aspart (novoLOG ) injection 0-9 Units, 0-9 Units, Subcutaneous, TID WC, Core, Prentice BROCKS, MD   lactated ringers  infusion, , Intravenous, Continuous, Core, Prentice BROCKS, MD, Last Rate: 75 mL/hr at 04/09/24 0042, New Bag at 04/09/24 0042   metroNIDAZOLE  (FLAGYL ) IVPB 500 mg, 500 mg, Intravenous, Q12H, Core, Prentice BROCKS, MD, Last Rate: 100 mL/hr at 04/09/24 0441, 500 mg at 04/09/24 0441   vancomycin  (VANCOREADY) IVPB 1500 mg/300 mL, 1,500 mg, Intravenous, Once, Core, Prentice BROCKS, MD   vancomycin  variable dose per unstable renal function (pharmacist dosing), , Does not apply, See admin instructions, Lenon Elsie HERO, [facility]  Vitals   Vitals:   2024-04-18 2204 04/18/2024 2206 04/09/24 0001 04/09/24 0357  BP:  (!) 101/56 (!) 80/49 (!) 99/58  Pulse:  69 (!) 59 64  Resp:  20 18   Temp:  (!) 100.6 F (38.1 C) 97.8 F (36.6 C) (!) 97.4 F (36.3 C)  TempSrc:      SpO2:  93% 98% 93%  Weight: 71 kg     Height: 5' 3 (1.6 m)       Body mass index is 27.73 kg/m.   Physical Exam   Gen: patient lying in bed, NAD CV: extremities appear well-perfused Resp: normal WOB  Neurologic exam MS: alert, oriented x4, follows commands Speech: mild dysarthria, no aphasia CN: PERRL, VFF, EOMI, sensation intact, face  symmetric, hearing intact to voice Motor: 5/5 strength throughout Sensory: SILT Reflexes: 2+ symm with toes down bilat Coordination: FNF intact bilat Gait: deferred   Labs/Imaging/Neurodiagnostic studies   CBC:  Recent Labs  Lab April 18, 2024 1621 April 18, 2024 2330 04/09/24 0426  WBC 12.8* 8.9 8.4  NEUTROABS 11.5*  --   --   HGB 13.0 11.5* 11.1*  HCT 38.5 34.7* 33.5*  MCV 87.9 89.7 88.4  PLT 163 123* 135*   Basic Metabolic Panel:  Lab Results  Component Value Date   NA 134 (L) Apr 18, 2024   K 3.2 (L) April 18, 2024   CO2 27 2024/04/18   GLUCOSE 172 (H) 2024/04/18   BUN 21 18-Apr-2024   CREATININE 1.11 (H) 04/18/2024   CALCIUM  9.5 04/18/2024   GFRNONAA 50 (L) 04/18/24   GFRAA >60 03/03/2019   Lipid Panel:  Lab Results  Component Value Date   LDLCALC 151 (H) 04/09/2024   HgbA1c:  Lab Results  Component Value Date   HGBA1C 5.2 04/08/2024   Urine Drug Screen: No results found for: LABOPIA, COCAINSCRNUR, LABBENZ, AMPHETMU, THCU, LABBARB  Alcohol Level     Component Value Date/Time   ETH <15 04/08/2024 1438   INR  Lab Results  Component Value Date   INR 1.2 04/08/2024   APTT  Lab Results  Component Value Date   APTT <22 (L) 04/08/2024   AED levels: No results found for: PHENYTOIN, ZONISAMIDE, LAMOTRIGINE, LEVETIRACETA   ASSESSMENT   Joann [name] is an 78 year old woman who initially presented to the ED today for generalized weakness and poor p.o. intake in the setting of having all of her teeth extracted 7 weeks ago.  While she was in triage she developed right arm hemiplegia  in front of the triage nurse at 2:25 PM.  Stroke code was activated.  On my examination she was nonfocal with then NIH of 1 due to chronic dysarthria.  Her symptoms resolved within 10 minutes.  Head CT showed no acute process.  TNK was not administered due to resolution of symptoms.  Patient's presentation most consistent with TIA and recommend admission for workup.    RECOMMENDATIONS   - Admit for stroke workup - Permissive HTN x48 hrs from sx onset or until stroke ruled out by MRI goal BP <220/110. PRN labetalol or hydralazine  if BP above these parameters. Avoid oral antihypertensives. - MRI brain wo contrast - CTA or MRA H&N - TTE - Check A1c and LDL + add statin per guidelines - ASA 81mg  daily + plavix  75mg  daily x21 days f/b ASA 81mg  daily monotherapy after that - q4 hr neuro checks - STAT head CT for any change in neuro exam - Tele - PT/OT/SLP - Stroke education - Amb referral to neurology upon discharge  ______________________________________________________________________    Signed, Elida CHRISTELLA Ross, MD [facility]

## 2024-04-09 NOTE — Evaluation (Signed)
 Physical Therapy Evaluation Patient Details Name: Joann [name] MRN: [id number] DOB: 1945/12/25 Today's Date: 04/09/2024  History of Present Illness  Pt is an 78 y.o. female presenting to hospital 04/08/24 with c/o nausea and progressive generalized weakness; episode of acute weakness affecting R side while pt in ED (code stroke activated; presentation most consistent with TIA per chart; symptoms resolved).  Pt with all of her teeth extracted 7 weeks ago.  Pt admitted with TIA, sepsis (secondary to cellulitis vs acute osteomyelitis L foot), osteomyelitis, biliary infection, hypomagnesemia, hyponatremia, hypokalemia, AKI, DM.  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  Clinical Impression  Prior to recent medical concerns, pt reports being modified independent ambulating with SPC; lives with her husband on main level of home with 3 STE L railing.  Pt's husband declined to have therapist get pt a post op shoe (pt's husband reports having multiple post op shoes at home already and would bring them in).  Currently pt is SBA with bed mobility and demonstrating good sitting balance; pt cued to laterally scoot on edge of bed (keeping L LE NWB'ing d/t not having post op shoe) but pt layed down in bed instead so transfer training deferred.  Plan for procedure today for L LE; will monitor for any changes to orders.  Pt would currently benefit from skilled PT to address noted impairments and functional limitations (see below for any additional details).  Upon hospital discharge, pt would benefit from ongoing therapy.     If plan is discharge home, recommend the following: A little help with walking and/or transfers;A little help with bathing/dressing/bathroom;Assistance with cooking/housework;Assist for transportation;Help with stairs or ramp for entrance   Can travel by private vehicle         Equipment Recommendations Wheelchair (measurements PT);Wheelchair cushion (measurements PT)  Recommendations for Other Services       Functional Status Assessment Patient has had a recent decline in their functional status and demonstrates the ability to make significant improvements in function in a reasonable and predictable amount of time.     Precautions / Restrictions Precautions Precautions: Fall Recall of Precautions/Restrictions: Intact Restrictions Weight Bearing Restrictions Per Provider Order: Yes LLE Weight Bearing Per Provider Order: Touchdown weight bearing Other Position/Activity Restrictions: Heel WB'ing/touch down for transfer L LE in post op shoe      Mobility  Bed Mobility Overal bed mobility: Needs Assistance Bed Mobility: Supine to Sit, Sit to Supine     Supine to sit: Supervision, HOB elevated Sit to supine: Supervision, HOB elevated   General bed mobility comments: 2 assist to boost pt up in bed end of session using bed pad    Transfers Overall transfer level: Needs assistance                 General transfer comment: Pt cued to laterally scoot on edge of bed but pt layed down in bed instead so deferred    Ambulation/Gait               General Gait Details: Deferred (d/t pt not having appropriate footwear present yet)  Stairs            Wheelchair Mobility     Tilt Bed    Modified Rankin (Stroke Patients Only)       Balance Overall balance assessment: Needs assistance Sitting-balance support: No upper extremity supported, Feet supported Sitting balance-Leahy Scale:  Good Sitting balance - Comments: steady reaching within BOS                                     Pertinent Vitals/Pain Pain Assessment Pain Assessment: 0-10 Pain Score: 0-No pain Pain Intervention(s): Limited activity within patient's tolerance, Monitored during session HR 55-68 bpm and SpO2 sats 96% or greater on room air  during session.    Home Living Family/patient expects to be discharged to:: Private residence Living Arrangements: Spouse/significant other Available Help at Discharge: Family;Available 24 hours/day Type of Home: House Home Access: Stairs to enter Entrance Stairs-Rails: Left Entrance Stairs-Number of Steps: 3   Home Layout: One level;Laundry or work area in basement (Pt does not need to go to the basement) Home Equipment: Agricultural consultant (2 wheels);Cane - single point;BSC/3in1;Grab bars - toilet;Grab bars - tub/shower      Prior Function               Mobility Comments: Modified independent ambulating with SPC.       Extremity/Trunk Assessment   Upper Extremity Assessment Upper Extremity Assessment: Defer to OT evaluation;Overall WFL for tasks assessed    Lower Extremity Assessment Lower Extremity Assessment: RLE deficits/detail;LLE deficits/detail RLE Deficits / Details: hip flexion 4+/5; knee flexion/extension at least 3+/5, and DF at least 3+/5 LLE Deficits / Details: hip flexion 4/5; knee flexion/extension at least 3+/5; and DF at least 3/5    Cervical / Trunk Assessment Cervical / Trunk Assessment: Normal  Communication   Communication Communication: No apparent difficulties    Cognition Arousal: Alert Behavior During Therapy: WFL for tasks assessed/performed   PT - Cognitive impairments: No apparent impairments                         Following commands: Intact       Cueing Cueing Techniques: Verbal cues     General Comments General comments (skin integrity, edema, etc.): L foot bandage in place.  Nursing cleared pt for participation in physical therapy.  Pt agreeable to PT session.  Pt's husband, pt' sister, and another visitor present during session.    Exercises     Assessment/Plan    PT Assessment Patient needs continued PT services  PT Problem List Decreased strength;Decreased activity tolerance;Decreased balance;Decreased  mobility;Decreased knowledge of use of DME;Decreased knowledge of precautions;Decreased skin integrity;Pain       PT Treatment Interventions DME instruction;Gait training;Stair training;Functional mobility training;Therapeutic activities;Therapeutic exercise;Balance training;Patient/family education    PT Goals (Current goals can be found in the Care Plan section)  Acute Rehab PT Goals Patient Stated Goal: to improve functional mobility PT Goal Formulation: With patient Time For Goal Achievement: 04/23/24 Potential to Achieve Goals: Good    Frequency Min 2X/week     Co-evaluation               AM-PAC PT 6 Clicks Mobility  Outcome Measure Help needed turning from your back to your side while in a flat bed without using bedrails?: None Help needed moving from lying on your back to sitting on the side of a flat bed without using bedrails?: A Little Help needed moving to and from a bed to a chair (including a wheelchair)?: A Little Help needed standing up from a chair using your arms (e.g., wheelchair or bedside chair)?: A Little Help needed to walk in hospital room?: A Lot Help needed climbing 3-5 steps  with a railing? : A Lot 6 Click Score: 17    End of Session   Activity Tolerance: Patient tolerated treatment well Patient left: in bed;with call bell/phone within reach;with bed alarm set;with family/visitor present Nurse Communication: Mobility status;Precautions PT Visit Diagnosis: Other abnormalities of gait and mobility (R26.89);Muscle weakness (generalized) (M62.81)    Time: 9056-8991 PT Time Calculation (min) (ACUTE ONLY): 25 min   Charges:   PT Evaluation $PT Eval Low Complexity: 1 Low   PT General Charges $$ ACUTE PT VISIT: 1 Visit        Damien Caulk, PT 04/09/24, 10:34 AM

## 2024-04-09 NOTE — Transfer of Care (Signed)
 Immediate Anesthesia Transfer of Care Note  Patient: Joann [name]  Procedure(s) Performed: IRRIGATION AND DEBRIDEMENT WOUND (Left)  Patient Location: PACU  Anesthesia Type:General  Level of Consciousness: drowsy and patient cooperative  Airway & Oxygen Therapy: Patient Spontanous Breathing and Patient connected to face mask oxygen  Post-op Assessment: Report given to RN and Post -op Vital signs reviewed and stable  Post vital signs: Reviewed and stable  Last Vitals:  Vitals Value Taken Time  BP 96/33 04/09/24 13:19  Temp 37.2 C 04/09/24 13:19  Pulse 86 04/09/24 13:24  Resp 14 04/09/24 13:24  SpO2 100 % 04/09/24 13:24  Vitals shown include unfiled device data.  Last Pain:  Vitals:   04/09/24 1319  TempSrc:   PainSc: Asleep         Complications: No notable events documented.

## 2024-04-09 NOTE — Consult Note (Signed)
 NAME: Joann [name]  DOB: August 11, 1945  MRN: [id number]  Date/Time: 04/09/2024 4:43 PM  REQUESTING PROVIDER: MSSA bacteremia Subjective:  REASON FOR CONSULT: Dr.Wouk ?History from patient and her husband Joann [name] is a 78 y.o. 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  MRI foot showed  MRI of the left foot showed abscess sat the site of prior amputation of the 5 th ray  I am seeing the patient for [facility] positive for MSSA 4/4  Past Medical History:  Diagnosis Date   Breast cancer (HCC) 2007   right breast lumpectomy with 36 rad tx   Breast mass 11/23/2016   6 o'clock   Diabetes mellitus without complication (HCC)    Hypertension     Past Surgical History:  Procedure Laterality Date   AMPUTATION Left 02/23/2019   Procedure: AMPUTATION LEFT 2nd Toe;  Surgeon: Ashley Soulier, DPM;  Location: [facility];  Service: Podiatry;  Laterality: Left;   AMPUTATION TOE Right 10/26/2020   Procedure: right great toe amputation;  Surgeon: Ashley Soulier, DPM;  Location: [facility];  Service: Podiatry;  Laterality: Right;   BONE EXCISION Left 05/02/2020    Procedure: BONE EXCISION;  Surgeon: Ashley Soulier, DPM;  Location: [facility];  Service: Podiatry;  Laterality: Left;   BREAST BIOPSY Right 2007   positive.  Lumpectomy with rad tx   BREAST LUMPECTOMY Right 2007   [facility] APPLICATION Left 05/02/2020   Procedure: ADJACENT SKIN TRANSFER;  Surgeon: Ashley Soulier, DPM;  Location: [facility];  Service: Podiatry;  Laterality: Left;   INCISION AND DRAINAGE Left 10/16/2016   Procedure: INCISION AND DRAINAGE;  Surgeon: Donnice Cory, DPM;  Location: [facility];  Service: Podiatry;  Laterality: Left;   IRRIGATION AND DEBRIDEMENT FOOT Left 03/02/2019   Procedure: IRRIGATION AND DEBRIDEMENT LEFT FOOT SECOND METATARSAL, DIABETIC;  Surgeon: Ashley Soulier, DPM;  Location: [facility];  Service: Podiatry;  Laterality: Left;   METATARSAL HEAD EXCISION Left 05/02/2020   Procedure: METATARSAL HEAD EXCISION 5th;  Surgeon: Ashley Soulier, DPM;  Location: [facility];  Service: Podiatry;  Laterality: Left;    Social History   Socioeconomic History   Marital status: Married    Spouse name: Not on file   Number of children: Not on file   Years of education: Not on file   Highest education level: Not on file  Occupational History   Not on file  Tobacco Use   Smoking status: Never   Smokeless tobacco: Never  Vaping Use   Vaping status: Never Used  Substance and Sexual Activity   Alcohol use: No   Drug use: No   Sexual activity: Not on file  Other Topics Concern   Not on file  Social History Narrative   Not on file   Social Drivers of Health   Financial Resource Strain: Low Risk  (03/07/2024)   Received from [facility]   Overall Financial Resource Strain (CARDIA)    Difficulty of Paying Living Expenses: Not hard at all  Food Insecurity: No Food Insecurity (04/08/2024)   Hunger Vital Sign    Worried About Running Out of Food in the Last Year: Never true    Ran Out of Food in the Last Year: Never true  Transportation Needs: No  Transportation Needs (04/08/2024)   PRAPARE - Administrator, Civil Service (Medical): No    Lack of Transportation (Non-Medical): No  Physical Activity: Not on file  Stress: Not on file  Social Connections: Moderately Integrated (04/08/2024)   Social Connection and Isolation Panel    Frequency of Communication with Friends and Family: More than three times a week    Frequency of Social Gatherings with Friends and Family: Never    Attends Religious Services: More than 4 times per year    Active Member of [employer] or Organizations: No    Attends Banker Meetings: Never    Marital Status: Married  Catering manager Violence: Not At Risk (04/08/2024)   Humiliation, Afraid, Rape, and Kick questionnaire    Fear of Current or Ex-Partner: No    Emotionally Abused: No    Physically Abused: No    Sexually Abused: No    Family History  Problem Relation Age of Onset   Dementia Mother    CAD Mother    CAD Father    Deep vein thrombosis Father    Breast cancer Neg Hx    Allergies  Allergen Reactions   Neosporin [Neomycin-Bacitracin Zn-Polymyx] Itching   Penicillins Itching    Has patient had a PCN reaction causing immediate rash, facial/tongue/throat swelling, SOB or lightheadedness with hypotension: no Has patient had a PCN reaction causing severe rash involving mucus membranes or skin necrosis: no Has patient had a PCN reaction that required hospitalization no Has patient had a PCN reaction occurring within the last 10 years:no If all of the above answers are NO, then may proceed with Cephalosporin use.    Statins     hallucinations   I? Current Facility-Administered Medications  Medication Dose Route Frequency Provider Last Rate Last Admin   0.9 %  sodium chloride  infusion   Intravenous Continuous Wouk, Devaughn Sayres, MD   New Bag at 04/09/24 1230   acetaminophen  (TYLENOL ) tablet 650 mg  650 mg Oral Q4H PRN Core, Prentice BROCKS, MD       Or   acetaminophen  (TYLENOL )  160 MG/5ML solution 650 mg  650 mg Per Tube Q4H PRN Core, Prentice BROCKS, MD       Or   acetaminophen  (TYLENOL ) suppository 650 mg  650 mg Rectal Q4H PRN Core, Prentice BROCKS, MD       [START ON 04/10/2024] ascorbic acid  (VITAMIN C ) tablet 500 mg  500 mg Oral BID Wouk, Devaughn Sayres, MD       aspirin  EC tablet 81 mg  81 mg Oral Daily Core, Prentice BROCKS, MD       ceFAZolin  (ANCEF ) IVPB 2g/100 mL premix  2 g Intravenous Q8H Justeen Hehr,  MD       clopidogrel  (PLAVIX ) tablet 75 mg  75 mg Oral Daily Core, Prentice BROCKS, MD       enoxaparin  (LOVENOX ) injection 40 mg  40 mg Subcutaneous Q24H Core, Prentice BROCKS, MD   40 mg at 04/09/24 0035   [START ON 04/10/2024] feeding supplement (ENSURE PLUS HIGH PROTEIN) liquid 237 mL  237 mL Oral TID BM Wouk, Devaughn Sayres, MD       insulin  aspart (novoLOG ) injection 0-5 Units  0-5 Units Subcutaneous QHS Core, Prentice BROCKS, MD   2 Units at 04/09/24 0035   insulin  aspart (novoLOG ) injection 0-9 Units  0-9 Units Subcutaneous TID WC Core, Prentice BROCKS, MD   2 Units at 04/09/24 1625   metroNIDAZOLE  (FLAGYL ) IVPB 500 mg  500 mg Intravenous Q12H Core, Prentice BROCKS, MD   Stopped at 04/09/24 1012   [START ON 04/10/2024] multivitamin with minerals tablet 1 tablet  1 tablet Oral Daily Wouk, Devaughn Sayres, MD       NOREEN ON 04/10/2024] zinc  sulfate (50mg  elemental zinc ) capsule 220 mg  220 mg Oral Daily Wouk, Devaughn Sayres, MD         Abtx:  Anti-infectives (From admission, onward)    Start     Dose/Rate Route Frequency Ordered Stop   04/09/24 1800  ceFAZolin  (ANCEF ) IVPB 2g/100 mL premix        2 g 200 mL/hr over 30 Minutes Intravenous Every 8 hours 04/09/24 1631     04/09/24 0930  vancomycin  (VANCOREADY) IVPB 1250 mg/250 mL        1,250 mg 166.7 mL/hr over 90 Minutes Intravenous  Once 04/09/24 0839 04/09/24 1200   04/09/24 0045  ceFEPIme  (MAXIPIME ) 2 g in sodium chloride  0.9 % 100 mL IVPB  Status:  Discontinued        2 g 200 mL/hr over 30 Minutes Intravenous Every 12 hours 04/08/24 2347 04/09/24  1631   04/08/24 2200  cefTRIAXone  (ROCEPHIN ) 2 g in sodium chloride  0.9 % 100 mL IVPB  Status:  Discontinued        2 g 200 mL/hr over 30 Minutes Intravenous Every 24 hours 04/08/24 1952 04/08/24 2337   04/08/24 2002  vancomycin  variable dose per unstable renal function (pharmacist dosing)  Status:  Discontinued         Does not apply See admin instructions 04/08/24 2002 04/09/24 1631   04/08/24 2000  metroNIDAZOLE  (FLAGYL ) IVPB 500 mg        500 mg 100 mL/hr over 60 Minutes Intravenous Every 12 hours 04/08/24 1952     04/08/24 1630  vancomycin  (VANCOREADY) IVPB 1500 mg/300 mL  Status:  Discontinued        1,500 mg 150 mL/hr over 120 Minutes Intravenous  Once 04/08/24 1610 04/09/24 0839   04/08/24 1615  piperacillin -tazobactam (ZOSYN ) IVPB 3.375 g        3.375 g 12.5 mL/hr over 240 Minutes Intravenous  Once 04/08/24 1610 04/08/24 2047       REVIEW OF SYSTEMS:  Const: negative fever, negative chills, ++ weight loss Eyes: negative diplopia or visual changes, negative eye pain ENT: negative coryza, negative sore throat Resp: negative cough, hemoptysis, dyspnea Cards: negative for chest pain, palpitations, lower extremity edema GU: negative for frequency, dysuria and hematuria GI: Negative for abdominal pain, diarrhea, bleeding, constipation Skin: negative for rash and pruritus Heme: negative for easy bruising and gum/nose bleeding MS: weakness Neurolo:negative for headaches, dizziness, vertigo, memory problems  Psych: negative for feelings of anxiety, depression  Endocrine: , diabetes Allergy/Immunology- as above Objective:  VITALS:  BP (!) 100/58 (BP Location: Left Arm)   Pulse 69   Temp 98.1 F (36.7 C) (Oral)   Resp 17   Ht 5' 3 (1.6 m)   Wt 71 kg   SpO2 93%   BMI 27.73 kg/m   PHYSICAL EXAM:  General: Alert, cooperative, no distress, appears stated age.  Head: Normocephalic, without obvious abnormality, atraumatic. Eyes: Conjunctivae clear, anicteric sclerae. Pupils  are equal ENT Nares normal. No drainage or sinus tenderness. edentulous Lips, mucosa, and tongue normal. No Thrush Neck: Supple, symmetrical, no adenopathy, thyroid: non tender no carotid bruit and no JVD. Back: No CVA tenderness. Lungs: Clear to auscultation bilaterally. No Wheezing or Rhonchi. No rales. Heart: Regular rate and rhythm, no murmur, rub or gallop. Abdomen: Soft, non-tender,not distended. Bowel sounds normal. No masses Extremities: left foot surgical dressing not removed Rt foot great amputated- site fine Skin: No rashes or lesions. Or bruising Lymph: Cervical, supraclavicular normal. Neurologic: Grossly non-focal Pertinent Labs Lab Results CBC    Component Value Date/Time   WBC 8.4 04/09/2024 0426   RBC 3.79 (L) 04/09/2024 0426   HGB 11.1 (L) 04/09/2024 0426   HGB 14.9 10/02/2013 1336   HCT 33.5 (L) 04/09/2024 0426   HCT 44.0 10/02/2013 1336   PLT 135 (L) 04/09/2024 0426   PLT 316 10/02/2013 1336   MCV 88.4 04/09/2024 0426   MCV 88 10/02/2013 1336   MCH 29.3 04/09/2024 0426   MCHC 33.1 04/09/2024 0426   RDW 14.5 04/09/2024 0426   RDW 13.7 10/02/2013 1336   LYMPHSABS 0.4 (L) 04/08/2024 1621   MONOABS 0.7 04/08/2024 1621   EOSABS 0.1 04/08/2024 1621   BASOSABS 0.1 04/08/2024 1621       Latest Ref Rng & Units 04/09/2024    4:26 AM 04/08/2024   10:54 PM 04/08/2024    2:38 PM  CMP  Glucose 70 - 99 mg/dL 871   827   BUN 8 - 23 mg/dL 26   21   Creatinine 9.55 - 1.00 mg/dL 8.67  8.88  8.68   Sodium 135 - 145 mmol/L 138   134   Potassium 3.5 - 5.1 mmol/L 3.8   3.2   Chloride 98 - 111 mmol/L 100   94   CO2 22 - 32 mmol/L 26   27   Calcium  8.9 - 10.3 mg/dL 8.3   9.5   Total Protein 6.5 - 8.1 g/dL 6.1   7.1   Total Bilirubin 0.0 - 1.2 mg/dL 1.6   2.7   Alkaline Phos 38 - 126 U/L 101   135   AST 15 - 41 U/L 51   76   ALT 0 - 44 U/L 37   44       Microbiology: Recent Results (from the past 240 hours)  Resp panel by RT-PCR (RSV, Flu A&B, Covid) Anterior  Nasal Swab     Status: None   Collection Time: 04/08/24  3:11 PM   Specimen: Anterior Nasal Swab  Result Value Ref Range Status   SARS Coronavirus 2 by RT PCR NEGATIVE NEGATIVE Final    Comment: (NOTE) SARS-CoV-2 target nucleic acids are NOT DETECTED.  The SARS-CoV-2 RNA is generally detectable in upper respiratory specimens during the acute phase of infection. The lowest concentration of SARS-CoV-2 viral copies this assay can detect is 138 copies/mL. A negative result does not preclude SARS-Cov-2 infection and should not be used as the sole basis for treatment  or other patient management decisions. A negative result may occur with  improper specimen collection/handling, submission of specimen other than nasopharyngeal swab, presence of viral mutation(s) within the areas targeted by this assay, and inadequate number of viral copies(<138 copies/mL). A negative result must be combined with clinical observations, patient history, and epidemiological information. The expected result is Negative.  Fact Sheet for Patients:  BloggerCourse.com  Fact Sheet for Healthcare Providers:  SeriousBroker.it  This test is no t yet approved or cleared by the [place]  FDA and  has been authorized for detection and/or diagnosis of SARS-CoV-2 by FDA under an Emergency Use Authorization (EUA). This EUA will remain  in effect (meaning this test can be used) for the duration of the COVID-19 declaration under Section 564(b)(1) of the Act, 21 U.S.C.section 360bbb-3(b)(1), unless the authorization is terminated  or revoked sooner.       Influenza A by PCR NEGATIVE NEGATIVE Final   Influenza B by PCR NEGATIVE NEGATIVE Final    Comment: (NOTE) The Xpert Xpress SARS-CoV-2/FLU/RSV plus assay is intended as an aid in the diagnosis of influenza from Nasopharyngeal swab specimens and should not be used as a sole basis for treatment. Nasal washings  and aspirates are unacceptable for Xpert Xpress SARS-CoV-2/FLU/RSV testing.  Fact Sheet for Patients: BloggerCourse.com  Fact Sheet for Healthcare Providers: SeriousBroker.it  This test is not yet approved or cleared by the [place]  FDA and has been authorized for detection and/or diagnosis of SARS-CoV-2 by FDA under an Emergency Use Authorization (EUA). This EUA will remain in effect (meaning this test can be used) for the duration of the COVID-19 declaration under Section 564(b)(1) of the Act, 21 U.S.C. section 360bbb-3(b)(1), unless the authorization is terminated or revoked.     Resp Syncytial Virus by PCR NEGATIVE NEGATIVE Final    Comment: (NOTE) Fact Sheet for Patients: BloggerCourse.com  Fact Sheet for Healthcare Providers: SeriousBroker.it  This test is not yet approved or cleared by the [place]  FDA and has been authorized for detection and/or diagnosis of SARS-CoV-2 by FDA under an Emergency Use Authorization (EUA). This EUA will remain in effect (meaning this test can be used) for the duration of the COVID-19 declaration under Section 564(b)(1) of the Act, 21 U.S.C. section 360bbb-3(b)(1), unless the authorization is terminated or revoked.  Performed at [facility], [address] [place], [place] [address]   Blood culture (routine x 2)     Status: None (Preliminary result)   Collection Time: 04/08/24  4:10 PM   Specimen: BLOOD  Result Value Ref Range Status   Specimen Description BLOOD LEFT ANTECUBITAL  Final   Special Requests   Final    BOTTLES DRAWN AEROBIC AND ANAEROBIC Blood Culture adequate volume   Culture  Setup Time   Final    GRAM POSITIVE COCCI IN BOTH AEROBIC AND ANAEROBIC BOTTLES Organism ID to follow CRITICAL RESULT CALLED TO, READ BACK BY AND VERIFIED WITH: ALEX CHAPPELL PHARMD [id number] 04/09/24 HNM    Culture   Final    NO  GROWTH < 24 HOURS Performed at [facility], [address], [place], [place] [address]    Report Status PENDING  Incomplete  Blood Culture ID Panel (Reflexed)     Status: Abnormal   Collection Time: 04/08/24  4:10 PM  Result Value Ref Range Status   Enterococcus faecalis NOT DETECTED NOT DETECTED Final   Enterococcus Faecium NOT DETECTED NOT DETECTED Final   Listeria monocytogenes NOT DETECTED NOT DETECTED Final  Staphylococcus species DETECTED (A) NOT DETECTED Final    Comment: CRITICAL RESULT CALLED TO, READ BACK BY AND VERIFIED WITH: ALEX CHAPPELL PHARMD [id number] 04/09/24 HNM    Staphylococcus aureus (BCID) DETECTED (A) NOT DETECTED Final    Comment: CRITICAL RESULT CALLED TO, READ BACK BY AND VERIFIED WITH: ALEX CHAPPELL PHARMD [id number] 04/09/24 HNM    Staphylococcus epidermidis NOT DETECTED NOT DETECTED Final   Staphylococcus lugdunensis NOT DETECTED NOT DETECTED Final   Streptococcus species NOT DETECTED NOT DETECTED Final   Streptococcus agalactiae NOT DETECTED NOT DETECTED Final   Streptococcus pneumoniae NOT DETECTED NOT DETECTED Final   Streptococcus pyogenes NOT DETECTED NOT DETECTED Final   A.calcoaceticus-baumannii NOT DETECTED NOT DETECTED Final   Bacteroides fragilis NOT DETECTED NOT DETECTED Final   Enterobacterales NOT DETECTED NOT DETECTED Final   Enterobacter cloacae complex NOT DETECTED NOT DETECTED Final   Escherichia coli NOT DETECTED NOT DETECTED Final   Klebsiella aerogenes NOT DETECTED NOT DETECTED Final   Klebsiella oxytoca NOT DETECTED NOT DETECTED Final   Klebsiella pneumoniae NOT DETECTED NOT DETECTED Final   Proteus species NOT DETECTED NOT DETECTED Final   Salmonella species NOT DETECTED NOT DETECTED Final   Serratia marcescens NOT DETECTED NOT DETECTED Final   Haemophilus influenzae NOT DETECTED NOT DETECTED Final   Neisseria meningitidis NOT DETECTED NOT DETECTED Final   Pseudomonas aeruginosa NOT DETECTED NOT DETECTED Final   Stenotrophomonas  maltophilia NOT DETECTED NOT DETECTED Final   Candida albicans NOT DETECTED NOT DETECTED Final   Candida auris NOT DETECTED NOT DETECTED Final   Candida glabrata NOT DETECTED NOT DETECTED Final   Candida krusei NOT DETECTED NOT DETECTED Final   Candida parapsilosis NOT DETECTED NOT DETECTED Final   Candida tropicalis NOT DETECTED NOT DETECTED Final   Cryptococcus neoformans/gattii NOT DETECTED NOT DETECTED Final   Meth resistant mecA/C and MREJ NOT DETECTED NOT DETECTED Final    Comment: Performed at [facility], [address] [place], [place] [address]  Blood culture (routine x 2)     Status: None (Preliminary result)   Collection Time: 04/08/24  4:21 PM   Specimen: BLOOD  Result Value Ref Range Status   Specimen Description BLOOD BLOOD LEFT FOREARM  Final   Special Requests   Final    BOTTLES DRAWN AEROBIC AND ANAEROBIC Blood Culture adequate volume   Culture  Setup Time   Final    GRAM POSITIVE COCCI IN BOTH AEROBIC AND ANAEROBIC BOTTLES CRITICAL RESULT CALLED TO, READ BACK BY AND VERIFIED WITHBETHA MAROLYN MARE PHARMD [id number] 04/09/24 HNM Performed at [facility], [address] [place], [place] [address]    Culture GRAM POSITIVE COCCI  Final   Report Status PENDING  Incomplete  Aerobic/Anaerobic Culture w Gram Stain (surgical/deep wound)     Status: None (Preliminary result)   Collection Time: 04/08/24 11:00 PM   Specimen: Wound  Result Value Ref Range Status   Specimen Description   Final    WOUND Performed at [facility], [address] [place], [place] [address]    Special Requests   Final    LEFT FOOT Performed at [facility], [address] [place], [place] [address]    Gram Stain   Final    RARE WBC PRESENT, PREDOMINANTLY PMN MODERATE GRAM POSITIVE COCCI RARE GRAM NEGATIVE RODS Performed at [facility], [address], [place], [place] [address]    Culture PENDING  Incomplete   Report Status PENDING  Incomplete  Lines and Device Date on insertion # of days DC  Central line     Foley     ETT       IMAGING RESULTS: MRI of left foot reviewed No osteo Small abscess at the lteral side I have personally reviewed the films ? 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 I have personally spent  75---minutes involved in face-to-face and non-face-to-face activities for this patient on the day of the visit. Professional time spent includes the following activities: Preparing to see the patient (review of tests), Obtaining and/or reviewing separately obtained history (admission/discharge record), Performing a medically appropriate examination and/or evaluation , Ordering medications/tests/procedures, referring and communicating with other health care professionals, Documenting clinical information in the EMR, Independently interpreting results (not separately reported), Communicating results to the patient/husband Counseling and educating the patient/husband and Care coordination (not separately reported).    ________________________________________________

## 2024-04-09 NOTE — Progress Notes (Signed)
 PROGRESS NOTE    Joann [name]  FMW:[id number] DOB: 1945-10-16 DOA: 04/08/2024 PCP: Joann Norleen BIRCH, MD     Brief Narrative:   From admission h and p  Joann [name] is an 78 year old female with a past medical history of type 2 diabetes mellitus hyperlipidemia hypertension who presents to the emergency department with generalized malaise and weakness, the patient reports since having her teeth extracted 7 weeks ago she has had decreased oral intake and increasing systemic symptoms.  With the patient's sister as well as husband at the bedside and assists with the history.  Interestingly while awaiting triage for the systemic symptoms the patient developed acute right sided transient weakness code stroke was called and the patient was evaluated for TIA, symptoms completely resolved.   When taking history of the patient only reports absence of fever or chills with increasing global weakness and decrease in p.o. intake had to directly ask about her left foot wound which reportedly has been present and worsening from 3 days ago which the husband stated started out as a callus the patient denies any abdominal pain.   Past medical records reviewed and summarized: Patient's Medicare annual wellness visit March 07, 2024: Comorbidities at that time type 2 diabetes mellitus hyperlipidemia hypertension history of breast cancer dermatitis, on hydrochlorothiazide  12.5 mg and metformin  as well as aspirin  potassium at that time   CT Head: 1. No acute intracranial abnormality.2. 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 Mild atrophy and white matter changes bilaterally. 4. Atherosclerotic calcifications in the cavernous carotid arteries bilaterally.   Assessment & Plan:   Principal Problem:   TIA (transient ischemic attack) Active Problems:   Sepsis (HCC)   Osteomyelitis (HCC)   Biliary infection   DM2 (diabetes mellitus, type 2) (HCC)    AKI (acute kidney injury) (HCC)   Protein calorie malnutrition (HCC)   Hypokalemia   Hyponatremia   Hypomagnesemia  # Left foot osteomyelitis Probed to bone with podiatry - I and D planned for today - continue vanc/cefepime /flagyl   # MSSA bacteremia In aerobic/anaerobic in one set - abx as above - ID consulted - TTE pending  # sepsis By fever, leukocytosis. Hemodynamically stable, normal mentation - fluids - abx  # TIA Transient right arm weakness yesterday. Neuro consulted. Neuroimaging negative - dapt 3 2ks followed by asa monotherapy - f/u lipids - tele - therapy consults after surgery - f/u tte  # Transaminitis Likely 2/2 sepsis. No abd pain. Ruq u/s shows normal small gallbladder, no ductal dilation - trend  # T2DM Euglycemic - SSI  # CKD 3a Initial cr 1.31, baseline is about 1.1 - fluids, trend  # HTN Bp moderately elevated - bp meds on hold, permissive htn 48 hours from 9/14 given TIA concern   DVT prophylaxis: lovenox  Code Status: full Family Communication: husband updated @ bedside  Level of care: Telemetry Medical Status is: Inpatient Remains inpatient appropriate because: severity of illness    Consultants:  ID, podiatry  Procedures: pending  Antimicrobials:  See above    Subjective: Mild left foot pain, otherwise feels well  Objective: Vitals:   04/08/24 2206 04/09/24 0001 04/09/24 0357 04/09/24 0859  BP: (!) 101/56 (!) 80/49 (!) 99/58 (!) 98/50  Pulse: 69 (!) 59 64 63  Resp: 20 18  17   Temp: (!) 100.6 F (38.1 C) 97.8 F (36.6 C) (!) 97.4 F (36.3 C) 98 F (36.7 C)  TempSrc:    Oral  SpO2: 93%  98% 93% 96%  Weight:      Height:        Intake/Output Summary (Last 24 hours) at 04/09/2024 0941 Last data filed at 04/08/2024 2047 Gross per 24 hour  Intake 100 ml  Output --  Net 100 ml   Filed Weights   04/08/24 1410 04/08/24 2204  Weight: 65.3 kg 71 kg    Examination:  General exam: Appears calm and comfortable   Respiratory system: Clear to auscultation. Respiratory effort normal. Cardiovascular system: S1 & S2 heard, RRR.   Gastrointestinal system: Abdomen is nondistended, soft and nontender. No organomegaly or masses felt. Normal bowel sounds heard. Central nervous system: Alert and oriented. No focal neurological deficits. Extremities: Symmetric 5 x 5 power. Skin: bandage left foot From 9/14:  Psychiatry: Judgement and insight appear normal. Mood & affect appropriate.     Data Reviewed: I have personally reviewed following labs and imaging studies  CBC: Recent Labs  Lab 04/08/24 1413 04/08/24 1621 04/08/24 2330 04/09/24 0426  WBC 14.1* 12.8* 8.9 8.4  NEUTROABS  --  11.5*  --   --   HGB 12.2 13.0 11.5* 11.1*  HCT 36.7 38.5 34.7* 33.5*  MCV 89.1 87.9 89.7 88.4  PLT 144* 163 123* 135*   Basic Metabolic Panel: Recent Labs  Lab 04/08/24 1438 04/08/24 2254  NA 134*  --   K 3.2*  --   CL 94*  --   CO2 27  --   GLUCOSE 172*  --   BUN 21  --   CREATININE 1.31* 1.11*  CALCIUM  9.5  --   MG 1.4*  --    GFR: Estimated Creatinine Clearance: 38.2 mL/min (A) (by C-G formula based on SCr of 1.11 mg/dL (H)). Liver Function Tests: Recent Labs  Lab 04/08/24 1438  AST 76*  ALT 44  ALKPHOS 135*  BILITOT 2.7*  PROT 7.1  ALBUMIN 3.0*   Recent Labs  Lab 04/08/24 1438  LIPASE 23   No results for input(s): AMMONIA in the last 168 hours. Coagulation Profile: Recent Labs  Lab 04/08/24 1438  INR 1.2   Cardiac Enzymes: No results for input(s): CKTOTAL, CKMB, CKMBINDEX, TROPONINI in the last 168 hours. BNP (last 3 results) No results for input(s): PROBNP in the last 8760 hours. HbA1C: Recent Labs    04/08/24 2330  HGBA1C 5.2   CBG: Recent Labs  Lab 04/08/24 1428 04/09/24 0001 04/09/24 0904  GLUCAP 179* 158* 105*   Lipid Profile: Recent Labs    04/09/24 0426  CHOL 203*  HDL 33*  LDLCALC 151*  TRIG 96  CHOLHDL 6.2   Thyroid Function Tests: No  results for input(s): TSH, T4TOTAL, FREET4, T3FREE, THYROIDAB in the last 72 hours. Anemia Panel: No results for input(s): VITAMINB12, FOLATE, FERRITIN, TIBC, IRON, RETICCTPCT in the last 72 hours. Urine analysis: No results found for: COLORURINE, APPEARANCEUR, LABSPEC, PHURINE, GLUCOSEU, HGBUR, BILIRUBINUR, KETONESUR, PROTEINUR, UROBILINOGEN, NITRITE, LEUKOCYTESUR Sepsis Labs: @LABRCNTIP (procalcitonin:4,lacticidven:4)  ) Recent Results (from the past 240 hours)  Resp panel by RT-PCR (RSV, Flu A&B, Covid) Anterior Nasal Swab     Status: None   Collection Time: 04/08/24  3:11 PM   Specimen: Anterior Nasal Swab  Result Value Ref Range Status   SARS Coronavirus 2 by RT PCR NEGATIVE NEGATIVE Final    Comment: (NOTE) SARS-CoV-2 target nucleic acids are NOT DETECTED.  The SARS-CoV-2 RNA is generally detectable in upper respiratory specimens during the acute phase of infection. The lowest concentration of SARS-CoV-2 viral copies this assay can  detect is 138 copies/mL. A negative result does not preclude SARS-Cov-2 infection and should not be used as the sole basis for treatment or other patient management decisions. A negative result may occur with  improper specimen collection/handling, submission of specimen other than nasopharyngeal swab, presence of viral mutation(s) within the areas targeted by this assay, and inadequate number of viral copies(<138 copies/mL). A negative result must be combined with clinical observations, patient history, and epidemiological information. The expected result is Negative.  Fact Sheet for Patients:  BloggerCourse.com  Fact Sheet for Healthcare Providers:  SeriousBroker.it  This test is no t yet approved or cleared by the [place]  FDA and  has been authorized for detection and/or diagnosis of SARS-CoV-2 by FDA under an Emergency Use Authorization  (EUA). This EUA will remain  in effect (meaning this test can be used) for the duration of the COVID-19 declaration under Section 564(b)(1) of the Act, 21 U.S.C.section 360bbb-3(b)(1), unless the authorization is terminated  or revoked sooner.       Influenza A by PCR NEGATIVE NEGATIVE Final   Influenza B by PCR NEGATIVE NEGATIVE Final    Comment: (NOTE) The Xpert Xpress SARS-CoV-2/FLU/RSV plus assay is intended as an aid in the diagnosis of influenza from Nasopharyngeal swab specimens and should not be used as a sole basis for treatment. Nasal washings and aspirates are unacceptable for Xpert Xpress SARS-CoV-2/FLU/RSV testing.  Fact Sheet for Patients: BloggerCourse.com  Fact Sheet for Healthcare Providers: SeriousBroker.it  This test is not yet approved or cleared by the [place]  FDA and has been authorized for detection and/or diagnosis of SARS-CoV-2 by FDA under an Emergency Use Authorization (EUA). This EUA will remain in effect (meaning this test can be used) for the duration of the COVID-19 declaration under Section 564(b)(1) of the Act, 21 U.S.C. section 360bbb-3(b)(1), unless the authorization is terminated or revoked.     Resp Syncytial Virus by PCR NEGATIVE NEGATIVE Final    Comment: (NOTE) Fact Sheet for Patients: BloggerCourse.com  Fact Sheet for Healthcare Providers: SeriousBroker.it  This test is not yet approved or cleared by the [place]  FDA and has been authorized for detection and/or diagnosis of SARS-CoV-2 by FDA under an Emergency Use Authorization (EUA). This EUA will remain in effect (meaning this test can be used) for the duration of the COVID-19 declaration under Section 564(b)(1) of the Act, 21 U.S.C. section 360bbb-3(b)(1), unless the authorization is terminated or revoked.  Performed at [facility], [address]  [place], [place] [address]   Blood culture (routine x 2)     Status: None (Preliminary result)   Collection Time: 04/08/24  4:10 PM   Specimen: BLOOD  Result Value Ref Range Status   Specimen Description BLOOD LEFT ANTECUBITAL  Final   Special Requests   Final    BOTTLES DRAWN AEROBIC AND ANAEROBIC Blood Culture adequate volume   Culture  Setup Time   Final    GRAM POSITIVE COCCI IN BOTH AEROBIC AND ANAEROBIC BOTTLES Organism ID to follow CRITICAL RESULT CALLED TO, READ BACK BY AND VERIFIED WITH: ALEX CHAPPELL PHARMD [id number] 04/09/24 HNM    Culture   Final    NO GROWTH < 24 HOURS Performed at [facility], [address], [place], [place] [address]    Report Status PENDING  Incomplete  Blood Culture ID Panel (Reflexed)     Status: Abnormal   Collection Time: 04/08/24  4:10 PM  Result Value Ref Range Status   Enterococcus faecalis NOT DETECTED  NOT DETECTED Final   Enterococcus Faecium NOT DETECTED NOT DETECTED Final   Listeria monocytogenes NOT DETECTED NOT DETECTED Final   Staphylococcus species DETECTED (A) NOT DETECTED Final    Comment: CRITICAL RESULT CALLED TO, READ BACK BY AND VERIFIED WITH: ALEX CHAPPELL PHARMD [id number] 04/09/24 HNM    Staphylococcus aureus (BCID) DETECTED (A) NOT DETECTED Final    Comment: CRITICAL RESULT CALLED TO, READ BACK BY AND VERIFIED WITH: ALEX CHAPPELL PHARMD [id number] 04/09/24 HNM    Staphylococcus epidermidis NOT DETECTED NOT DETECTED Final   Staphylococcus lugdunensis NOT DETECTED NOT DETECTED Final   Streptococcus species NOT DETECTED NOT DETECTED Final   Streptococcus agalactiae NOT DETECTED NOT DETECTED Final   Streptococcus pneumoniae NOT DETECTED NOT DETECTED Final   Streptococcus pyogenes NOT DETECTED NOT DETECTED Final   A.calcoaceticus-baumannii NOT DETECTED NOT DETECTED Final   Bacteroides fragilis NOT DETECTED NOT DETECTED Final   Enterobacterales NOT DETECTED NOT DETECTED Final   Enterobacter cloacae complex NOT DETECTED NOT DETECTED  Final   Escherichia coli NOT DETECTED NOT DETECTED Final   Klebsiella aerogenes NOT DETECTED NOT DETECTED Final   Klebsiella oxytoca NOT DETECTED NOT DETECTED Final   Klebsiella pneumoniae NOT DETECTED NOT DETECTED Final   Proteus species NOT DETECTED NOT DETECTED Final   Salmonella species NOT DETECTED NOT DETECTED Final   Serratia marcescens NOT DETECTED NOT DETECTED Final   Haemophilus influenzae NOT DETECTED NOT DETECTED Final   Neisseria meningitidis NOT DETECTED NOT DETECTED Final   Pseudomonas aeruginosa NOT DETECTED NOT DETECTED Final   Stenotrophomonas maltophilia NOT DETECTED NOT DETECTED Final   Candida albicans NOT DETECTED NOT DETECTED Final   Candida auris NOT DETECTED NOT DETECTED Final   Candida glabrata NOT DETECTED NOT DETECTED Final   Candida krusei NOT DETECTED NOT DETECTED Final   Candida parapsilosis NOT DETECTED NOT DETECTED Final   Candida tropicalis NOT DETECTED NOT DETECTED Final   Cryptococcus neoformans/gattii NOT DETECTED NOT DETECTED Final   Meth resistant mecA/C and MREJ NOT DETECTED NOT DETECTED Final    Comment: Performed at [facility], [address] [place], [place] [address]  Blood culture (routine x 2)     Status: None (Preliminary result)   Collection Time: 04/08/24  4:21 PM   Specimen: BLOOD  Result Value Ref Range Status   Specimen Description BLOOD BLOOD LEFT FOREARM  Final   Special Requests   Final    BOTTLES DRAWN AEROBIC AND ANAEROBIC Blood Culture adequate volume   Culture  Setup Time   Final    GRAM POSITIVE COCCI AEROBIC BOTTLE ONLY CRITICAL RESULT CALLED TO, READ BACK BY AND VERIFIED WITH: Performed at [facility], [address] [place], [place] [address]    Culture GRAM POSITIVE COCCI  Final   Report Status PENDING  Incomplete  Aerobic/Anaerobic Culture w Gram Stain (surgical/deep wound)     Status: None (Preliminary result)   Collection Time: 04/08/24 11:00 PM   Specimen: Wound  Result Value Ref Range  Status   Specimen Description   Final    WOUND Performed at [facility], [address], [place], [place] [address]    Special Requests   Final    LEFT FOOT Performed at [facility], [address] [place], [place] [address]    Gram Stain   Final    RARE WBC PRESENT, PREDOMINANTLY PMN MODERATE GRAM POSITIVE COCCI RARE GRAM NEGATIVE RODS Performed at [facility], [address], [place], [place] [address]  Culture PENDING  Incomplete   Report Status PENDING  Incomplete         Radiology Studies: MR BRAIN WO CONTRAST Addendum Date: 04/08/2024 ADDENDUM REPORT: 04/08/2024 23:00 ADDENDUM: Please note that this examination was inadvertently signed off prior to completing the initial dictation. The following addendum includes findings for the MRA portions of the head and neck as well as the overall impression for these studies. MRA HEAD FINDINGS: ANTERIOR CIRCULATION: Right ICA widely patent the siphon without stenosis. Irregular attenuated intermittent flow seen within the visualized distal cervical left ICA, which largely occludes at the skull base. Attenuated distal reconstitution at the left carotid siphon the of the left ophthalmic artery. Right A1 segment patent. Left A1 attenuated but patent. Normal anterior communicating complex. Both ACAs patent without visible stenosis. Right M1 segment widely patent. Atheromatous irregularity about proximal M2 branch with mild stenoses, inferior division (series 1026, image 6). Right MCA branches otherwise widely patent. Attenuated but patent flow within the left M1 segment and distal left MCA branches. POSTERIOR CIRCULATION: Visualized vertebral arteries patent without stenosis. Left vertebral artery dominant. Both PICA patent at their origins. Basilar patent without stenosis. Superior cerebellar arteries patent bilaterally. Both PCAs primarily supplied via the basilar. Mild atheromatous irregularity about the PCAs with  associated mild bilateral P2 stenoses. PCAs remain patent to their distal aspects. No intracranial aneurysm. MRA NECK FINDINGS: AORTIC ARCH: Examination mildly degraded by motion and lack of IV contrast. Visualized aortic arch within normal limits for caliber standard branch pattern. No visible stenosis about the origin of the great vessels. RIGHT CAROTID SYSTEM: Right common and internal carotid arteries are patent with antegrade flow. No visible dissection. Atheromatous change about the right carotid bulb/proximal right ICA with associated stenosis of up to 60 percent by NASCET criteria. LEFT CAROTID SYSTEM: Left CCA grossly patent at its origin, and remains grossly patent to the bifurcation. Left ICA appears occluded at its origin at the bifurcation. Persisted perfusion within the left ECA and its major branches. VERTEBRAL ARTERIES: Both vertebral arteries arise from subclavian arteries. Left vertebral artery dominant. Vertebral arteries are patent with antegrade flow. No visible to stenosis or dissection. Other: None. IMPRESSION: MRI HEAD IMPRESSION: 1. No acute intracranial abnormality. 2. 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 3. Few small meningiomas measuring up to 1.1 cm along the falx and overlying the left frontal convexity as above. No associated mass effect or edema. MRA HEAD IMPRESSION: 1. Loss of normal flow void within the left ICA to the siphon, consistent with occlusion as seen on corresponding MRA of the neck. Distal reconstitution at the left siphon with patent but attenuated flow within the left MCA distribution. 2. Otherwise wide patency of the intracranial arterial circulation. No other hemodynamically significant or correctable stenosis. MRA NECK IMPRESSION: 1. 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 Left ICA remains largely occluded to the skull base. 2. Atheromatous  change about the right carotid bulb/proximal right ICA with associated stenosis of up to 60 percent by NASCET criteria. 3. Wide patency of both vertebral arteries within the neck. Left vertebral artery dominant. Electronically Signed   By: Morene Hoard M.D.   On: 04/08/2024 23:00   Result Date: 04/08/2024 CLINICAL DATA:  Initial evaluation for acute neuro deficit, stroke suspected. EXAM: MRI HEAD WITHOUT CONTRAST MRA HEAD WITHOUT CONTRAST MRA NECK WITHOUT CONTRAST TECHNIQUE: Multiplanar, multiecho pulse sequences of the brain and surrounding structures were obtained without intravenous  contrast. Angiographic images of the Circle of Willis were obtained using MRA technique without intravenous contrast. Angiographic images of the neck were obtained using MRA technique without intravenous contrast. Carotid stenosis measurements (when applicable) are obtained utilizing NASCET criteria, using the distal internal carotid diameter as the denominator. COMPARISON:  None Available. FINDINGS: MRI HEAD FINDINGS Brain: Diffuse prominence of the CSF containing spaces compatible generalized cerebral atrophy. Patchy T2/FLAIR hyperintensity involving the periventricular deep white matter both cerebral hemispheres, consistent with chronic small vessel ischemic disease, mild-to-moderate in nature. Remote lacunar infarct present at the anterior right basal ganglia. Probable small remote right parietal infarct (series 12, image 32). No evidence for acute or subacute infarct. Gray-white matter adjacent maintained. No acute or chronic intracranial hemorrhage. 1.1 cm meningioma present along the falx without significant mass effect or edema (series 6, image 18). Additional small 1 cm meningioma overlies the left frontal convexity without mass effect or edema (series 6, image 17). No other mass lesion, mass effect, or midline shift. No hydrocephalus or extra-axial fluid collection. Vascular: Loss of normal flow void within the  left ICA to the siphon, consistent with slow flow and/or occlusion. Major intracranial vascular flow voids are otherwise maintained. Skull and upper cervical spine: Cranial junction within normal limits. Bone marrow signal intensity overall within normal limits. No scalp soft tissue abnormality. Sinuses/Orbits: Globes orbital soft tissues within normal limits. Paranasal sinuses are largely clear. No mastoid effusion. Other: None. MRA HEAD FINDINGS ANTERIOR CIRCULATION: POSTERIOR CIRCULATION: MRA NECK FINDINGS Electronically Signed: By: Morene Hoard M.D. On: 04/08/2024 22:32   MR ANGIO HEAD WO CONTRAST Addendum Date: 04/08/2024 ADDENDUM REPORT: 04/08/2024 23:00 ADDENDUM: Please note that this examination was inadvertently signed off prior to completing the initial dictation. The following addendum includes findings for the MRA portions of the head and neck as well as the overall impression for these studies. MRA HEAD FINDINGS: ANTERIOR CIRCULATION: Right ICA widely patent the siphon without stenosis. Irregular attenuated intermittent flow seen within the visualized distal cervical left ICA, which largely occludes at the skull base. Attenuated distal reconstitution at the left carotid siphon the of the left ophthalmic artery. Right A1 segment patent. Left A1 attenuated but patent. Normal anterior communicating complex. Both ACAs patent without visible stenosis. Right M1 segment widely patent. Atheromatous irregularity about proximal M2 branch with mild stenoses, inferior division (series 1026, image 6). Right MCA branches otherwise widely patent. Attenuated but patent flow within the left M1 segment and distal left MCA branches. POSTERIOR CIRCULATION: Visualized vertebral arteries patent without stenosis. Left vertebral artery dominant. Both PICA patent at their origins. Basilar patent without stenosis. Superior cerebellar arteries patent bilaterally. Both PCAs primarily supplied via the basilar. Mild  atheromatous irregularity about the PCAs with associated mild bilateral P2 stenoses. PCAs remain patent to their distal aspects. No intracranial aneurysm. MRA NECK FINDINGS: AORTIC ARCH: Examination mildly degraded by motion and lack of IV contrast. Visualized aortic arch within normal limits for caliber standard branch pattern. No visible stenosis about the origin of the great vessels. RIGHT CAROTID SYSTEM: Right common and internal carotid arteries are patent with antegrade flow. No visible dissection. Atheromatous change about the right carotid bulb/proximal right ICA with associated stenosis of up to 60 percent by NASCET criteria. LEFT CAROTID SYSTEM: Left CCA grossly patent at its origin, and remains grossly patent to the bifurcation. Left ICA appears occluded at its origin at the bifurcation. Persisted perfusion within the left ECA and its major branches. VERTEBRAL ARTERIES: Both vertebral arteries arise from subclavian  arteries. Left vertebral artery dominant. Vertebral arteries are patent with antegrade flow. No visible to stenosis or dissection. Other: None. IMPRESSION: MRI HEAD IMPRESSION: 1. No acute intracranial abnormality. 2. 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 3. Few small meningiomas measuring up to 1.1 cm along the falx and overlying the left frontal convexity as above. No associated mass effect or edema. MRA HEAD IMPRESSION: 1. Loss of normal flow void within the left ICA to the siphon, consistent with occlusion as seen on corresponding MRA of the neck. Distal reconstitution at the left siphon with patent but attenuated flow within the left MCA distribution. 2. Otherwise wide patency of the intracranial arterial circulation. No other hemodynamically significant or correctable stenosis. MRA NECK IMPRESSION: 1. 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 Left ICA remains largely  occluded to the skull base. 2. Atheromatous change about the right carotid bulb/proximal right ICA with associated stenosis of up to 60 percent by NASCET criteria. 3. Wide patency of both vertebral arteries within the neck. Left vertebral artery dominant. Electronically Signed   By: Morene Hoard M.D.   On: 04/08/2024 23:00   Result Date: 04/08/2024 CLINICAL DATA:  Initial evaluation for acute neuro deficit, stroke suspected. EXAM: MRI HEAD WITHOUT CONTRAST MRA HEAD WITHOUT CONTRAST MRA NECK WITHOUT CONTRAST TECHNIQUE: Multiplanar, multiecho pulse sequences of the brain and surrounding structures were obtained without intravenous contrast. Angiographic images of the Circle of Willis were obtained using MRA technique without intravenous contrast. Angiographic images of the neck were obtained using MRA technique without intravenous contrast. Carotid stenosis measurements (when applicable) are obtained utilizing NASCET criteria, using the distal internal carotid diameter as the denominator. COMPARISON:  None Available. FINDINGS: MRI HEAD FINDINGS Brain: Diffuse prominence of the CSF containing spaces compatible generalized cerebral atrophy. Patchy T2/FLAIR hyperintensity involving the periventricular deep white matter both cerebral hemispheres, consistent with chronic small vessel ischemic disease, mild-to-moderate in nature. Remote lacunar infarct present at the anterior right basal ganglia. Probable small remote right parietal infarct (series 12, image 32). No evidence for acute or subacute infarct. Gray-white matter adjacent maintained. No acute or chronic intracranial hemorrhage. 1.1 cm meningioma present along the falx without significant mass effect or edema (series 6, image 18). Additional small 1 cm meningioma overlies the left frontal convexity without mass effect or edema (series 6, image 17). No other mass lesion, mass effect, or midline shift. No hydrocephalus or extra-axial fluid collection.  Vascular: Loss of normal flow void within the left ICA to the siphon, consistent with slow flow and/or occlusion. Major intracranial vascular flow voids are otherwise maintained. Skull and upper cervical spine: Cranial junction within normal limits. Bone marrow signal intensity overall within normal limits. No scalp soft tissue abnormality. Sinuses/Orbits: Globes orbital soft tissues within normal limits. Paranasal sinuses are largely clear. No mastoid effusion. Other: None. MRA HEAD FINDINGS ANTERIOR CIRCULATION: POSTERIOR CIRCULATION: MRA NECK FINDINGS Electronically Signed: By: Morene Hoard M.D. On: 04/08/2024 22:32   MR ANGIO NECK WO CONTRAST Addendum Date: 04/08/2024 ADDENDUM REPORT: 04/08/2024 23:00 ADDENDUM: Please note that this examination was inadvertently signed off prior to completing the initial dictation. The following addendum includes findings for the MRA portions of the head and neck as well as the overall impression for these studies. MRA HEAD FINDINGS: ANTERIOR CIRCULATION: Right ICA widely patent the siphon without stenosis. Irregular attenuated intermittent flow seen within the visualized distal cervical left ICA, which largely occludes at the skull  base. Attenuated distal reconstitution at the left carotid siphon the of the left ophthalmic artery. Right A1 segment patent. Left A1 attenuated but patent. Normal anterior communicating complex. Both ACAs patent without visible stenosis. Right M1 segment widely patent. Atheromatous irregularity about proximal M2 branch with mild stenoses, inferior division (series 1026, image 6). Right MCA branches otherwise widely patent. Attenuated but patent flow within the left M1 segment and distal left MCA branches. POSTERIOR CIRCULATION: Visualized vertebral arteries patent without stenosis. Left vertebral artery dominant. Both PICA patent at their origins. Basilar patent without stenosis. Superior cerebellar arteries patent bilaterally. Both PCAs  primarily supplied via the basilar. Mild atheromatous irregularity about the PCAs with associated mild bilateral P2 stenoses. PCAs remain patent to their distal aspects. No intracranial aneurysm. MRA NECK FINDINGS: AORTIC ARCH: Examination mildly degraded by motion and lack of IV contrast. Visualized aortic arch within normal limits for caliber standard branch pattern. No visible stenosis about the origin of the great vessels. RIGHT CAROTID SYSTEM: Right common and internal carotid arteries are patent with antegrade flow. No visible dissection. Atheromatous change about the right carotid bulb/proximal right ICA with associated stenosis of up to 60 percent by NASCET criteria. LEFT CAROTID SYSTEM: Left CCA grossly patent at its origin, and remains grossly patent to the bifurcation. Left ICA appears occluded at its origin at the bifurcation. Persisted perfusion within the left ECA and its major branches. VERTEBRAL ARTERIES: Both vertebral arteries arise from subclavian arteries. Left vertebral artery dominant. Vertebral arteries are patent with antegrade flow. No visible to stenosis or dissection. Other: None. IMPRESSION: MRI HEAD IMPRESSION: 1. No acute intracranial abnormality. 2. 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 3. Few small meningiomas measuring up to 1.1 cm along the falx and overlying the left frontal convexity as above. No associated mass effect or edema. MRA HEAD IMPRESSION: 1. Loss of normal flow void within the left ICA to the siphon, consistent with occlusion as seen on corresponding MRA of the neck. Distal reconstitution at the left siphon with patent but attenuated flow within the left MCA distribution. 2. Otherwise wide patency of the intracranial arterial circulation. No other hemodynamically significant or correctable stenosis. MRA NECK IMPRESSION: 1. 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 Left ICA remains largely occluded to the skull base. 2. Atheromatous change about the right carotid bulb/proximal right ICA with associated stenosis of up to 60 percent by NASCET criteria. 3. Wide patency of both vertebral arteries within the neck. Left vertebral artery dominant. Electronically Signed   By: Morene Hoard M.D.   On: 04/08/2024 23:00   Result Date: 04/08/2024 CLINICAL DATA:  Initial evaluation for acute neuro deficit, stroke suspected. EXAM: MRI HEAD WITHOUT CONTRAST MRA HEAD WITHOUT CONTRAST MRA NECK WITHOUT CONTRAST TECHNIQUE: Multiplanar, multiecho pulse sequences of the brain and surrounding structures were obtained without intravenous contrast. Angiographic images of the Circle of Willis were obtained using MRA technique without intravenous contrast. Angiographic images of the neck were obtained using MRA technique without intravenous contrast. Carotid stenosis measurements (when applicable) are obtained utilizing NASCET criteria, using the distal internal carotid diameter as the denominator. COMPARISON:  None Available. FINDINGS: MRI HEAD FINDINGS Brain: Diffuse prominence of the CSF containing spaces compatible generalized cerebral atrophy. Patchy T2/FLAIR hyperintensity involving the periventricular deep white matter both cerebral hemispheres, consistent with chronic small vessel ischemic disease, mild-to-moderate in nature. Remote lacunar infarct present at the anterior right basal ganglia. Probable small  remote right parietal infarct (series 12, image 32). No evidence for acute or subacute infarct. Gray-white matter adjacent maintained. No acute or chronic intracranial hemorrhage. 1.1 cm meningioma present along the falx without significant mass effect or edema (series 6, image 18). Additional small 1 cm meningioma overlies the left frontal convexity without mass effect or edema (series 6, image 17). No other mass lesion, mass effect, or midline shift. No  hydrocephalus or extra-axial fluid collection. Vascular: Loss of normal flow void within the left ICA to the siphon, consistent with slow flow and/or occlusion. Major intracranial vascular flow voids are otherwise maintained. Skull and upper cervical spine: Cranial junction within normal limits. Bone marrow signal intensity overall within normal limits. No scalp soft tissue abnormality. Sinuses/Orbits: Globes orbital soft tissues within normal limits. Paranasal sinuses are largely clear. No mastoid effusion. Other: None. MRA HEAD FINDINGS ANTERIOR CIRCULATION: POSTERIOR CIRCULATION: MRA NECK FINDINGS Electronically Signed: By: Morene Hoard M.D. On: 04/08/2024 22:32   US  RENAL Result Date: 04/08/2024 CLINICAL DATA:  Acute renal injury EXAM: RENAL / URINARY TRACT ULTRASOUND COMPLETE COMPARISON:  None Available. FINDINGS: Right Kidney: Renal measurements: 8.6 x 5.6 x 5.1 cm. = volume: 128 mL. Echogenicity within normal limits. No mass or hydronephrosis visualized. Left Kidney: Renal measurements: 10.2 x 5.1 x 5.4 cm. = volume: 149 mL. Echogenicity within normal limits. No mass or hydronephrosis visualized. Bladder: Appears normal for degree of bladder distention. Other: Mild splenomegaly is noted IMPRESSION: Unremarkable kidneys. Mild splenomegaly. Electronically Signed   By: Oneil Devonshire M.D.   On: 04/08/2024 20:37   US  Abdomen Limited RUQ (LIVER/GB) Result Date: 04/08/2024 CLINICAL DATA:  Elevated liver enzymes. EXAM: ULTRASOUND ABDOMEN LIMITED RIGHT UPPER QUADRANT COMPARISON:  None Available. FINDINGS: Gallbladder: No gallstones or wall thickening visualized. No sonographic Murphy sign noted by sonographer. Apparent tiny nonshadowing and nonmobile nodules measure up to 5 mm, likely polyps. Common bile duct: Diameter: 5 mm Liver: There is diffuse increased liver echogenicity most commonly seen in the setting of fatty infiltration. Superimposed inflammation or fibrosis is not excluded. Clinical  correlation is recommended. Portal vein is patent on color Doppler imaging with normal direction of blood flow towards the liver. Other: None. IMPRESSION: 1. No gallstone. 2. Tiny gallbladder polyps measure up to 5 mm. 3. Fatty liver. Electronically Signed   By: Vanetta Chou M.D.   On: 04/08/2024 20:35   DG Foot Complete Left Result Date: 04/08/2024 CLINICAL DATA:  Plantar wound. EXAM: LEFT FOOT - COMPLETE 3+ VIEW COMPARISON:  Left foot radiograph dated 10/15/2016. 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 No acute fracture or dislocation. The bones are osteopenic. There is ulceration of the plantar skin at the fifth metatarsal. IMPRESSION: 1. No acute fracture or dislocation. 2. Plantar skin ulceration. Electronically Signed   By: Vanetta Chou M.D.   On: 04/08/2024 17:15   CT HEAD CODE STROKE WO CONTRAST Result Date: 04/08/2024 EXAM: CT HEAD WITHOUT CONTRAST 04/08/2024 02:51:00 PM TECHNIQUE: CT of the head was performed without the administration of intravenous contrast. Automated exposure control, iterative reconstruction, and/or weight based adjustment of the mA/kV was utilized to reduce the radiation dose to as low as reasonably achievable. COMPARISON: None available. CLINICAL HISTORY: Neuro deficit, acute, stroke suspected. Mental status change. FINDINGS: BRAIN AND VENTRICLES: A remote infarct is present in the anterior right frontal lobe with Wallerian degeneration extending inferiorly. Mild atrophy and white matter changes are present bilaterally.  No acute hemorrhage. No evidence of acute infarct. No hydrocephalus. No extra-axial collection. No mass effect or midline shift. ORBITS: No acute abnormality. SINUSES: No acute abnormality. SOFT TISSUES AND SKULL: No acute soft tissue abnormality. No skull fracture. VASCULATURE: Atherosclerotic calcifications are present in the cavernous carotid arteries  bilaterally. No hyperdense vessel is present. IMPRESSION: 1. No acute intracranial abnormality. 2. 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 3. Mild atrophy and white matter changes bilaterally. 4. Atherosclerotic calcifications in the cavernous carotid arteries bilaterally. Findings were communicated to Dr Matthews via the Amion system at 2:58 pm Electronically signed by: Lonni Necessary MD 04/08/2024 03:00 PM EDT RP Workstation: [id number]        Scheduled Meds:  aspirin  EC  81 mg Oral Daily   clopidogrel   75 mg Oral Daily   enoxaparin  (LOVENOX ) injection  40 mg Subcutaneous Q24H   insulin  aspart  0-5 Units Subcutaneous QHS   insulin  aspart  0-9 Units Subcutaneous TID WC   vancomycin  variable dose per unstable renal function (pharmacist dosing)   Does not apply See admin instructions   Continuous Infusions:  sodium chloride  150 mL/hr at 04/08/24 1916   ceFEPime  (MAXIPIME ) IV 2 g (04/09/24 0320)   lactated ringers  75 mL/hr at 04/09/24 0042   metronidazole  500 mg (04/09/24 0912)   vancomycin        LOS: 1 day     Devaughn KATHEE Ban, MD [facility]   If 7PM-7AM, please contact night-coverage [email or web address] Password [email or web address] 04/09/2024, 9:41 AM

## 2024-04-09 NOTE — Brief Op Note (Signed)
 04/08/2024 - 04/09/2024  1:14 PM  PATIENT:  Heron Elveria Sharps  78 y.o. female  PRE-OPERATIVE DIAGNOSIS:  acute abscess, left foot   POST-OPERATIVE DIAGNOSIS:  acute abscess, left foot   PROCEDURE:  Procedure(s) with comments: IRRIGATION AND DEBRIDEMENT WOUND (Left) - 04/08/24 Can do from anywhere from 12:20 to 1pm or anytime 4:30 onward.  SURGEON:  Surgeons and Role:    DEWAINE Blush, Greig MATSU, DPM - Primary  PHYSICIAN ASSISTANT:   ASSISTANTS: none   ANESTHESIA:   MAC  EBL:  5 mL   BLOOD ADMINISTERED:none  DRAINS: none   LOCAL MEDICATIONS USED:  1:1 LIDOCAINE : MARCAINE   SPECIMEN:  Source of Specimen:  TISSUE LEFT FOOT  DISPOSITION OF SPECIMEN:  PATHOLOGY / MICROBIOLOGY   COUNTS:  YES  TOURNIQUET:  * Missing tourniquet times found for documented tourniquets in log: [id number] *  DICTATION: .Note written in EPIC  PLAN OF CARE: Admit to inpatient   PATIENT DISPOSITION:  PACU - hemodynamically stable.   Delay start of Pharmacological VTE agent (>24hrs) due to surgical blood loss or risk of bleeding: no

## 2024-04-09 NOTE — Progress Notes (Signed)
 OT Cancellation Note  Patient Details Name: Joann [name] MRN: [id number] DOB: 1945/08/04   Cancelled Treatment:    Reason Eval/Treat Not Completed: Other (comment). Orders received, chart reviewed. Pt pending I&D of LLE today; OT will follow and perform eval as appropriate with new/continuation of current OT orders.   Desaray Marschner L. Everlyn Farabaugh, OTR/L  04/09/24, 8:38 AM

## 2024-04-10 ENCOUNTER — Inpatient Hospital Stay (HOSPITAL_COMMUNITY)
Admit: 2024-04-10 | Discharge: 2024-04-10 | Disposition: A | Discharge status: Home / Self Care | Attending: Internal Medicine | Admitting: Internal Medicine

## 2024-04-10 DIAGNOSIS — G459 Transient cerebral ischemic attack, unspecified: Secondary | ICD-10-CM | POA: Diagnosis not present

## 2024-04-10 DIAGNOSIS — B9561 Methicillin susceptible Staphylococcus aureus infection as the cause of diseases classified elsewhere: Secondary | ICD-10-CM

## 2024-04-10 LAB — CBC
HCT: 35.3 % — ABNORMAL LOW (ref 36.0–46.0)
Hemoglobin: 11.7 g/dL — ABNORMAL LOW (ref 12.0–15.0)
MCH: 29.3 pg (ref 26.0–34.0)
MCHC: 33.1 g/dL (ref 30.0–36.0)
MCV: 88.5 fL (ref 80.0–100.0)
Platelets: 153 K/uL (ref 150–400)
RBC: 3.99 MIL/uL (ref 3.87–5.11)
RDW: 14.6 % (ref 11.5–15.5)
WBC: 6.7 K/uL (ref 4.0–10.5)
nRBC: 0 % (ref 0.0–0.2)

## 2024-04-10 LAB — GLUCOSE, CAPILLARY
Glucose-Capillary: 165 mg/dL — ABNORMAL HIGH (ref 70–99)
Glucose-Capillary: 160 mg/dL — ABNORMAL HIGH (ref 70–99)
Glucose-Capillary: 186 mg/dL — ABNORMAL HIGH (ref 70–99)
Glucose-Capillary: 139 mg/dL — ABNORMAL HIGH (ref 70–99)

## 2024-04-10 LAB — COMPREHENSIVE METABOLIC PANEL WITH GFR
ALT: 32 U/L (ref 0–44)
AST: 40 U/L (ref 15–41)
Albumin: 2.2 g/dL — ABNORMAL LOW (ref 3.5–5.0)
Alkaline Phosphatase: 91 U/L (ref 38–126)
Anion gap: 11 (ref 5–15)
BUN: 30 mg/dL — ABNORMAL HIGH (ref 8–23)
CO2: 23 mmol/L (ref 22–32)
Calcium: 8.3 mg/dL — ABNORMAL LOW (ref 8.9–10.3)
Chloride: 102 mmol/L (ref 98–111)
Creatinine, Ser: 1.13 mg/dL — ABNORMAL HIGH (ref 0.44–1.00)
GFR, Estimated: 49 mL/min — ABNORMAL LOW (ref >60)
Glucose, Bld: 160 mg/dL — ABNORMAL HIGH (ref 70–99)
Potassium: 3.8 mmol/L (ref 3.5–5.1)
Sodium: 136 mmol/L (ref 135–145)
Total Bilirubin: 1 mg/dL (ref 0.0–1.2)
Total Protein: 6 g/dL — ABNORMAL LOW (ref 6.5–8.1)

## 2024-04-10 LAB — ECHOCARDIOGRAM COMPLETE
AR max vel: 2.33 cm2
AV Area VTI: 2.14 cm2
AV Area mean vel: 2 cm2
AV Mean grad: 3 mmHg
AV Peak grad: 6.5 mmHg
Ao pk vel: 1.27 m/s
Area-P 1/2: 4.86 cm2
Height: 63 in
MV VTI: 1.85 cm2
S' Lateral: 3.1 cm
Weight: 2504.43 [oz_av]

## 2024-04-10 MED ORDER — ATORVASTATIN CALCIUM 20 MG PO TABS
40.0000 mg | ORAL_TABLET | Freq: Every day | ORAL | Status: DC
  Administered 2024-04-10: 40 mg via ORAL
  Filled 2024-04-10 (×2): qty 2

## 2024-04-10 NOTE — Evaluation (Signed)
 Physical Therapy Re-Evaluation Patient Details Name: Joann [name] MRN: [id number] DOB: 1945-09-26 Today's Date: 04/10/2024  History of Present Illness  Pt is an 78 y.o. female presenting to hospital 04/08/24 with c/o nausea and progressive generalized weakness; episode of acute weakness affecting R side while pt in ED (code stroke activated; presentation most consistent with TIA per chart; symptoms resolved).  Pt with all of her teeth extracted 7 weeks ago.  Pt admitted with TIA, sepsis (secondary to cellulitis vs acute osteomyelitis L foot), osteomyelitis, biliary infection, hypomagnesemia, hyponatremia, hypokalemia, AKI, DM.  S/p I&D L foot wound 04/09/24 d/t acute abscess and diabetic foot infection.   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  Clinical Impression  Pt seen for PT re-evaluation s/p procedure.  Prior to hospitalization pt was modified independent ambulating with SPC.  Pt reporting no pain throughout session.  Currently pt is SBA with bed mobility; min assist progressing to CGA with transfers from bed; and CGA to ambulate 10 feet with RW use (vc's required for WB'ing status/gait technique; pt and pt's husband educated on WB'ing restrictions, precautions, and assist levels).  Pt's husband reports he is able to assist pt as needed at home and has no concerns with pt discharging home.  PT POC reviewed and updated.  Will continue to focus on strengthening and limited functional mobility during hospitalization.      If plan is discharge home, recommend the following: A little help with walking and/or transfers;A little help with bathing/dressing/bathroom;Assistance with cooking/housework;Assist for transportation;Help with stairs or ramp for entrance   Can travel by private vehicle        Equipment Recommendations Wheelchair (measurements PT);Wheelchair cushion  (measurements PT)  Recommendations for Other Services       Functional Status Assessment Patient has had a recent decline in their functional status and demonstrates the ability to make significant improvements in function in a reasonable and predictable amount of time.     Precautions / Restrictions Precautions Precautions: Fall Recall of Precautions/Restrictions: Intact Restrictions Weight Bearing Restrictions Per Provider Order: Yes LLE Weight Bearing Per Provider Order: Touchdown weight bearing Other Position/Activity Restrictions: Per op note POST-OPERATIVE PLAN:  Patient was instructed to be heel touch down for transfer on the operative foot in a postoperative shoe.   Activity: Avoid excessive ambulation or dependent positioning of the operative extremity. Elevate as much as possible until your post-operative appointment.      Mobility  Bed Mobility Overal bed mobility: Needs Assistance Bed Mobility: Supine to Sit, Sit to Supine     Supine to sit: Supervision, HOB elevated Sit to supine: Supervision, HOB elevated   General bed mobility comments: mild increased effort to perform on own    Transfers Overall transfer level: Needs assistance Equipment used: Rolling walker (2 wheels) Transfers: Sit to/from Stand Sit to Stand: Min assist, Contact guard assist           General transfer comment: min assist 1st trial standing progressing to CGA 2nd trial standing from bed; vc's for UE positioning and overall technique    Ambulation/Gait Ambulation/Gait assistance: Contact guard assist Gait Distance (Feet): 10 Feet Assistive device: Rolling walker (2 wheels) Gait Pattern/deviations: Step-to pattern Gait velocity: decreased     General Gait Details: vc's for WB'ing status and overall technique; pt/pt's husband requesting pt wear L heel wedge post op shoe that husband brought in (d/t being easier  for pt to keep her precautions); regular shoe placed on R LE  Stairs             Wheelchair Mobility     Tilt Bed    Modified Rankin (Stroke Patients Only)       Balance Overall balance assessment: Needs assistance Sitting-balance support: No upper extremity supported, Feet supported Sitting balance-Leahy Scale: Good Sitting balance - Comments: steady reaching within BOS   Standing balance support: During functional activity, Bilateral upper extremity supported, Reliant on assistive device for balance Standing balance-Leahy Scale: Fair Standing balance comment: no loss of balance ambulating with RW                             Pertinent Vitals/Pain Pain Assessment Pain Assessment: No/denies pain Pain Location: L foot Pain Intervention(s): Limited activity within patient's tolerance, Monitored during session, Repositioned    Home Living Family/patient expects to be discharged to:: Private residence Living Arrangements: Spouse/significant other Available Help at Discharge: Family;Available 24 hours/day Type of Home: House Home Access: Stairs to enter Entrance Stairs-Rails: Left Entrance Stairs-Number of Steps: 3   Home Layout: One level;Laundry or work area in [employer] Equipment: Agricultural consultant (2 wheels);Cane - single point;BSC/3in1;Grab bars - toilet;Grab bars - tub/shower      Prior Function Prior Level of Function : Independent/Modified Independent             Mobility Comments: Modified independent ambulating with SPC. ADLs Comments: Spouse occasionally assisted with lower body ADL's     Extremity/Trunk Assessment   Upper Extremity Assessment Upper Extremity Assessment: Defer to OT evaluation    Lower Extremity Assessment Lower Extremity Assessment: Generalized weakness    Cervical / Trunk Assessment Cervical / Trunk Assessment: Normal  Communication   Communication Communication: No apparent difficulties    Cognition Arousal: Alert Behavior During Therapy: WFL for tasks assessed/performed    PT - Cognitive impairments: No apparent impairments                         Following commands: Impaired Following commands impaired: Follows one step commands inconsistently     Cueing Cueing Techniques: Verbal cues, Tactile cues, Visual cues     General Comments General comments (skin integrity, edema, etc.): HR stable during session.  Nursing cleared pt for participation in physical therapy.  Pt agreeable to PT session.  Pt's husband present entire session.    Exercises     Assessment/Plan    PT Assessment Patient needs continued PT services  PT Problem List Decreased strength;Decreased activity tolerance;Decreased balance;Decreased mobility;Decreased knowledge of use of DME;Decreased knowledge of precautions;Decreased skin integrity;Pain       PT Treatment Interventions DME instruction;Gait training;Stair training;Functional mobility training;Therapeutic activities;Therapeutic exercise;Balance training;Patient/family education    PT Goals (Current goals can be found in the Care Plan section)  Acute Rehab PT Goals Patient Stated Goal: to improve functional mobility PT Goal Formulation: With patient Time For Goal Achievement: 04/24/24 Potential to Achieve Goals: Good    Frequency Min 3X/week     Co-evaluation               AM-PAC PT 6 Clicks Mobility  Outcome Measure Help needed turning from your back to your side while in a flat bed without using bedrails?: None Help needed moving from lying on your back to sitting on the side of a flat bed without using bedrails?: A Little Help needed moving  to and from a bed to a chair (including a wheelchair)?: A Little Help needed standing up from a chair using your arms (e.g., wheelchair or bedside chair)?: A Little Help needed to walk in hospital room?: A Little Help needed climbing 3-5 steps with a railing? : A Lot 6 Click Score: 18    End of Session Equipment Utilized During Treatment: Gait belt Activity  Tolerance: Patient tolerated treatment well Patient left: in bed;with call bell/phone within reach;with bed alarm set;with family/visitor present Nurse Communication: Mobility status;Precautions PT Visit Diagnosis: Other abnormalities of gait and mobility (R26.89);Muscle weakness (generalized) (M62.81)    Time: 8381-8368 PT Time Calculation (min) (ACUTE ONLY): 13 min   Charges:   PT Evaluation $PT Re-evaluation: 1 Re-eval   PT General Charges $$ ACUTE PT VISIT: 1 Visit        Damien Caulk, PT 04/10/24, 6:05 PM

## 2024-04-10 NOTE — Progress Notes (Signed)
 Date of Admission:  04/08/2024      ID: Joann [name] is a 78 y.o. female Principal Problem:   TIA (transient ischemic attack) Active Problems:   Osteomyelitis (HCC)   Sepsis (HCC)   DM2 (diabetes mellitus, type 2) (HCC)   Biliary infection   AKI (acute kidney injury) (HCC)   Protein calorie malnutrition (HCC)   Hypokalemia   Hyponatremia   Hypomagnesemia   Malnutrition of moderate degree    Subjective: Patient is doing better No specific complaints Unable to eat solid food because of lack of teeth   Medications:   vitamin C   500 mg Oral BID   aspirin  EC  81 mg Oral Daily   atorvastatin   40 mg Oral Daily   clopidogrel   75 mg Oral Daily   enoxaparin  (LOVENOX ) injection  40 mg Subcutaneous Q24H   feeding supplement  237 mL Oral TID BM   insulin  aspart  0-5 Units Subcutaneous QHS   insulin  aspart  0-9 Units Subcutaneous TID WC   multivitamin with minerals  1 tablet Oral Daily   zinc  sulfate (50mg  elemental zinc )  220 mg Oral Daily    Objective: Vital signs in last 24 hours: Patient Vitals for the past 24 hrs:  BP Temp Temp src Pulse Resp SpO2  04/10/24 1150 118/61 97.8 F (36.6 C) Oral 77 17 96 %  04/10/24 0750 (!) 106/55 97.6 F (36.4 C) Oral (!) 48 17 95 %  04/10/24 0350 98/60 -- -- 70 18 98 %  04/09/24 2350 (!) 93/51 98.1 F (36.7 C) -- 66 18 94 %  04/09/24 1958 91/65 98.3 F (36.8 C) -- 61 18 93 %  04/09/24 1600 (!) 100/58 98.1 F (36.7 C) Oral 69 17 93 %     Lines and Device Date on insertion # of days DC  Chiropractor     Foley     ETT       PHYSICAL EXAM:  General: Alert, cooperative, no distress, appears stated age.  Lungs: Clear to auscultation bilaterally. No Wheezing or Rhonchi. No rales. Heart: Regular rate and rhythm, no murmur, rub or gallop. Abdomen: Soft, non-tender,not distended. Bowel sounds normal. No masses Extremities: Left dressing not removed Skin: No rashes or lesions. Or bruising Lymph: Cervical,  supraclavicular normal. Neurologic: Grossly non-focal  Lab Results    Latest Ref Rng & Units 04/10/2024    5:17 AM 04/09/2024    4:26 AM 04/08/2024   11:30 PM  CBC  WBC 4.0 - 10.5 K/uL 6.7  8.4  8.9   Hemoglobin 12.0 - 15.0 g/dL 88.2  88.8  88.4   Hematocrit 36.0 - 46.0 % 35.3  33.5  34.7   Platelets 150 - 400 K/uL 153  135  123        Latest Ref Rng & Units 04/10/2024    5:17 AM 04/09/2024    4:26 AM 04/08/2024   10:54 PM  CMP  Glucose 70 - 99 mg/dL 839  871    BUN 8 - 23 mg/dL 30  26    Creatinine 9.55 - 1.00 mg/dL 8.86  8.67  8.88   Sodium 135 - 145 mmol/L 136  138    Potassium 3.5 - 5.1 mmol/L 3.8  3.8    Chloride 98 - 111 mmol/L 102  100    CO2 22 - 32 mmol/L 23  26    Calcium  8.9 - 10.3 mg/dL 8.3  8.3    Total Protein 6.5 - 8.1  g/dL 6.0  6.1    Total Bilirubin 0.0 - 1.2 mg/dL 1.0  1.6    Alkaline Phos 38 - 126 U/L 91  101    AST 15 - 41 U/L 40  51    ALT 0 - 44 U/L 32  37        Microbiology: Blood culture 04/08/2024 4 out of 4 Staph aureus 04/10/2024 blood culture has been sent 04/08/2024 left foot wound culture Proteus mirabilis  915 tissue culture from surgery pending Studies/Results: ECHOCARDIOGRAM COMPLETE Result Date: 04/10/2024    ECHOCARDIOGRAM REPORT   Patient Name:   SHALAYNA ORNSTEIN Date of Exam: 04/10/2024 Medical Rec #:  [id number]             Height:       63.0 in Accession #:    [id number]            Weight:       156.5 lb Date of Birth:  February 17, 1946             BSA:          1.742 m Patient Age:    78 years              BP:           98/60 mmHg Patient Gender: F                     HR:           70 bpm. Exam Location:  [facility] Procedure: 2D Echo, Color Doppler, Cardiac Doppler, Strain Analysis and Saline            Contrast Bubble Study (Both Spectral and Color Flow Doppler were            utilized during procedure). Indications:     Stroke I63.9  History:         Patient has no prior history of Echocardiogram examinations.                  Risk  Factors:Diabetes and Hypertension.  Sonographer:     Christopher Furnace Referring Phys:  [id number] ANDREW C CORE Diagnosing Phys: Annalee Custovic  Sonographer Comments: Global longitudinal strain was attempted. IMPRESSIONS  1. Left ventricular ejection fraction, by estimation, is 60 to 65%. The left ventricle has normal function. The left ventricle has no regional wall motion abnormalities. Left ventricular diastolic parameters are consistent with Grade I diastolic dysfunction (impaired relaxation).  2. Right ventricular systolic function is normal. The right ventricular size is normal.  3. The mitral valve is normal in structure. Mild to moderate mitral valve regurgitation. No evidence of mitral stenosis.  4. The aortic valve is normal in structure. Aortic valve regurgitation is not visualized. No aortic stenosis is present.  5. The inferior vena cava is normal in size with greater than 50% respiratory variability, suggesting right atrial pressure of 3 mmHg.  6. Agitated saline contrast bubble study was negative, with no evidence of any interatrial shunt. FINDINGS  Left Ventricle: Left ventricular ejection fraction, by estimation, is 60 to 65%. The left ventricle has normal function. The left ventricle has no regional wall motion abnormalities. The left ventricular internal cavity size was normal in size. There is  no left ventricular hypertrophy. Left ventricular diastolic parameters are consistent with Grade I diastolic dysfunction (impaired relaxation). Right Ventricle: The right ventricular size is normal. No increase in right ventricular wall thickness. Right ventricular systolic function is normal. Left  Atrium: Left atrial size was normal in size. Right Atrium: Right atrial size was normal in size. Pericardium: There is no evidence of pericardial effusion. Mitral Valve: The mitral valve is normal in structure. Mild to moderate mitral valve regurgitation. No evidence of mitral valve stenosis. MV peak gradient, 8.5 mmHg.  The mean mitral valve gradient is 2.0 mmHg. Tricuspid Valve: The tricuspid valve is normal in structure. Tricuspid valve regurgitation is mild. Aortic Valve: The aortic valve is normal in structure. Aortic valve regurgitation is not visualized. No aortic stenosis is present. Aortic valve mean gradient measures 3.0 mmHg. Aortic valve peak gradient measures 6.5 mmHg. Aortic valve area, by VTI measures 2.14 cm. Pulmonic Valve: The pulmonic valve was normal in structure. Pulmonic valve regurgitation is not visualized. Aorta: The aortic root is normal in size and structure. Venous: The inferior vena cava is normal in size with greater than 50% respiratory variability, suggesting right atrial pressure of 3 mmHg. IAS/Shunts: No atrial level shunt detected by color flow Doppler. Agitated saline contrast was given intravenously to evaluate for intracardiac shunting. Agitated saline contrast bubble study was negative, with no evidence of any interatrial shunt.  LEFT VENTRICLE PLAX 2D LVIDd:         4.50 cm   Diastology LVIDs:         3.10 cm   LV e' medial:    9.14 cm/s LV PW:         1.20 cm   LV E/e' medial:  11.4 LV IVS:        0.70 cm   LV e' lateral:   10.30 cm/s LVOT diam:     2.00 cm   LV E/e' lateral: 10.1 LV SV:         69 LV SV Index:   39 LVOT Area:     3.14 cm  RIGHT VENTRICLE RV Basal diam:  4.00 cm RV Mid diam:    3.60 cm LEFT ATRIUM             Index        RIGHT ATRIUM           Index LA diam:        3.30 cm 1.89 cm/m   RA Area:     14.90 cm LA Vol (A2C):   44.0 ml 25.25 ml/m  RA Volume:   41.80 ml  23.99 ml/m LA Vol (A4C):   60.9 ml 34.95 ml/m LA Biplane Vol: 54.8 ml 31.45 ml/m  AORTIC VALVE AV Area (Vmax):    2.33 cm AV Area (Vmean):   2.00 cm AV Area (VTI):     2.14 cm AV Vmax:           127.00 cm/s AV Vmean:          85.000 cm/s AV VTI:            0.321 m AV Peak Grad:      6.5 mmHg AV Mean Grad:      3.0 mmHg LVOT Vmax:         94.30 cm/s LVOT Vmean:        54.000 cm/s LVOT VTI:          0.219  m LVOT/AV VTI ratio: 0.68  AORTA Ao Root diam: 2.20 cm MITRAL VALVE                TRICUSPID VALVE MV Area (PHT): 4.86 cm     TR Peak grad:   19.5 mmHg MV Area  VTI:   1.85 cm     TR Vmax:        221.00 cm/s MV Peak grad:  8.5 mmHg MV Mean grad:  2.0 mmHg     SHUNTS MV Vmax:       1.46 m/s     Systemic VTI:  0.22 m MV Vmean:      61.9 cm/s    Systemic Diam: 2.00 cm MV Decel Time: 156 msec MV E velocity: 104.00 cm/s MV A velocity: 66.00 cm/s MV E/A ratio:  1.58 Designer, multimedia signed by Annalee Casa Signature Date/Time: 04/10/2024/12:34:42 PM    Final    MR FOOT LEFT WO CONTRAST Result Date: 04/09/2024 CLINICAL DATA:  Osteomyelitis, foot Plantar foot wound.  History of partial metatarsal amputations. EXAM: MRI OF THE LEFT FOOT WITHOUT CONTRAST TECHNIQUE: Multiplanar, multisequence MR imaging of the left forefoot was performed. No intravenous contrast was administered. COMPARISON:  Radiographs 04/08/2024 and 10/15/2016. MRI of the left forefoot 02/21/2019 and 12/11/2019. FINDINGS: Bones/Joint/Cartilage Postsurgical changes from amputation of the 2nd metatarsal at the metatarsal neck are unchanged from the previous MRI. Interval resection of the distal 5th metatarsal and base of the 5th proximal phalanx. No highly suspicious marrow signal abnormalities within the remaining 5th ray to suggest recurrent osteomyelitis. There is T2 hyperintensity within the proximal 2nd metatarsal without cortical destruction, possibly stress related. Nearby subcortical cyst formation in the 2nd and 3rd metatarsal bases and adjacent cuneiform bones. No evidence of acute fracture or dislocation. No significant joint effusions are identified. There are moderate midfoot degenerative changes and stable mild chronic flattening of the 3rd metatarsal head. Ligaments Intact Lisfranc ligament. The collateral ligaments of the remaining metatarsophalangeal joints appear intact. Muscles and Tendons Chronic fatty forefoot muscular  atrophy, similar to previous study. No focal intramuscular fluid collection or acute tendon rupture identified. The abductor digiti minimi tendon is discontinuous at the level of the distal 5th metatarsal, likely related to previous amputation. Soft tissues Soft tissue wound along the plantar lateral aspect of the forefoot at the level of the 5th metatarsal amputation. There is an adjacent focal fluid collection along the discontinuous abductor digiti minimi tendon which measures approximately 1.7 x 0.9 x 1.5 cm. There are surrounding soft tissue inflammatory changes without other organized fluid collections. IMPRESSION: 1. Soft tissue wound along the plantar lateral aspect of the forefoot at the level of the 5th metatarsal amputation with adjacent focal fluid collection suspicious for a small abscess. 2. No specific evidence of recurrent osteomyelitis at this time. Postsurgical changes in the 5th ray as described. 3. T2 hyperintensity within the proximal 2nd metatarsal without cortical destruction, possibly stress related. Stable postsurgical changes from amputation of the 2nd metatarsal at the metatarsal neck. 4. Chronic fatty forefoot muscular atrophy. Electronically Signed   By: Elsie Perone M.D.   On: 04/09/2024 09:57   MR BRAIN WO CONTRAST Addendum Date: 04/08/2024 ADDENDUM REPORT: 04/08/2024 23:00 ADDENDUM: Please note that this examination was inadvertently signed off prior to completing the initial dictation. The following addendum includes findings for the MRA portions of the head and neck as well as the overall impression for these studies. MRA HEAD FINDINGS: ANTERIOR CIRCULATION: Right ICA widely patent the siphon without stenosis. Irregular attenuated intermittent flow seen within the visualized distal cervical left ICA, which largely occludes at the skull base. Attenuated distal reconstitution at the left carotid siphon the of the left ophthalmic artery. Right A1 segment patent. Left A1  attenuated but patent. Normal anterior communicating complex. Both  ACAs patent without visible stenosis. Right M1 segment widely patent. Atheromatous irregularity about proximal M2 branch with mild stenoses, inferior division (series 1026, image 6). Right MCA branches otherwise widely patent. Attenuated but patent flow within the left M1 segment and distal left MCA branches. POSTERIOR CIRCULATION: Visualized vertebral arteries patent without stenosis. Left vertebral artery dominant. Both PICA patent at their origins. Basilar patent without stenosis. Superior cerebellar arteries patent bilaterally. Both PCAs primarily supplied via the basilar. Mild atheromatous irregularity about the PCAs with associated mild bilateral P2 stenoses. PCAs remain patent to their distal aspects. No intracranial aneurysm. MRA NECK FINDINGS: AORTIC ARCH: Examination mildly degraded by motion and lack of IV contrast. Visualized aortic arch within normal limits for caliber standard branch pattern. No visible stenosis about the origin of the great vessels. RIGHT CAROTID SYSTEM: Right common and internal carotid arteries are patent with antegrade flow. No visible dissection. Atheromatous change about the right carotid bulb/proximal right ICA with associated stenosis of up to 60 percent by NASCET criteria. LEFT CAROTID SYSTEM: Left CCA grossly patent at its origin, and remains grossly patent to the bifurcation. Left ICA appears occluded at its origin at the bifurcation. Persisted perfusion within the left ECA and its major branches. VERTEBRAL ARTERIES: Both vertebral arteries arise from subclavian arteries. Left vertebral artery dominant. Vertebral arteries are patent with antegrade flow. No visible to stenosis or dissection. Other: None. IMPRESSION: MRI HEAD IMPRESSION: 1. No acute intracranial abnormality. 2. Underlying age-related cerebral atrophy with mild-to-moderate chronic microvascular ischemic disease, with remote right basal ganglia  lacunar infarct. 3. Few small meningiomas measuring up to 1.1 cm along the falx and overlying the left frontal convexity as above. No associated mass effect or edema. MRA HEAD IMPRESSION: 1. Loss of normal flow void within the left ICA to the siphon, consistent with occlusion as seen on corresponding MRA of the neck. Distal reconstitution at the left siphon with patent but attenuated flow within the left MCA distribution. 2. Otherwise wide patency of the intracranial arterial circulation. No other hemodynamically significant or correctable stenosis. MRA NECK IMPRESSION: 1. 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 Left ICA remains largely occluded to the skull base. 2. Atheromatous change about the right carotid bulb/proximal right ICA with associated stenosis of up to 60 percent by NASCET criteria. 3. Wide patency of both vertebral arteries within the neck. Left vertebral artery dominant. Electronically Signed   By: Morene Hoard M.D.   On: 04/08/2024 23:00   Result Date: 04/08/2024 CLINICAL DATA:  Initial evaluation for acute neuro deficit, stroke suspected. EXAM: MRI HEAD WITHOUT CONTRAST MRA HEAD WITHOUT CONTRAST MRA NECK WITHOUT CONTRAST TECHNIQUE: Multiplanar, multiecho pulse sequences of the brain and surrounding structures were obtained without intravenous contrast. Angiographic images of the Circle of Willis were obtained using MRA technique without intravenous contrast. Angiographic images of the neck were obtained using MRA technique without intravenous contrast. Carotid stenosis measurements (when applicable) are obtained utilizing NASCET criteria, using the distal internal carotid diameter as the denominator. COMPARISON:  None Available. FINDINGS: MRI HEAD FINDINGS Brain: Diffuse prominence of the CSF containing spaces compatible generalized cerebral atrophy. Patchy T2/FLAIR hyperintensity involving the periventricular deep white matter both cerebral hemispheres, consistent with  chronic small vessel ischemic disease, mild-to-moderate in nature. Remote lacunar infarct present at the anterior right basal ganglia. Probable small remote right parietal infarct (series 12, image 32). No evidence for acute or subacute infarct. Gray-white matter adjacent maintained. No acute or chronic intracranial hemorrhage. 1.1 cm meningioma present  along the falx without significant mass effect or edema (series 6, image 18). Additional small 1 cm meningioma overlies the left frontal convexity without mass effect or edema (series 6, image 17). No other mass lesion, mass effect, or midline shift. No hydrocephalus or extra-axial fluid collection. Vascular: Loss of normal flow void within the left ICA to the siphon, consistent with slow flow and/or occlusion. Major intracranial vascular flow voids are otherwise maintained. Skull and upper cervical spine: Cranial junction within normal limits. Bone marrow signal intensity overall within normal limits. No scalp soft tissue abnormality. Sinuses/Orbits: Globes orbital soft tissues within normal limits. Paranasal sinuses are largely clear. No mastoid effusion. Other: None. MRA HEAD FINDINGS ANTERIOR CIRCULATION: POSTERIOR CIRCULATION: MRA NECK FINDINGS Electronically Signed: By: Morene Hoard M.D. On: 04/08/2024 22:32   MR ANGIO HEAD WO CONTRAST Addendum Date: 04/08/2024 ADDENDUM REPORT: 04/08/2024 23:00 ADDENDUM: Please note that this examination was inadvertently signed off prior to completing the initial dictation. The following addendum includes findings for the MRA portions of the head and neck as well as the overall impression for these studies. MRA HEAD FINDINGS: ANTERIOR CIRCULATION: Right ICA widely patent the siphon without stenosis. Irregular attenuated intermittent flow seen within the visualized distal cervical left ICA, which largely occludes at the skull base. Attenuated distal reconstitution at the left carotid siphon the of the left ophthalmic  artery. Right A1 segment patent. Left A1 attenuated but patent. Normal anterior communicating complex. Both ACAs patent without visible stenosis. Right M1 segment widely patent. Atheromatous irregularity about proximal M2 branch with mild stenoses, inferior division (series 1026, image 6). Right MCA branches otherwise widely patent. Attenuated but patent flow within the left M1 segment and distal left MCA branches. POSTERIOR CIRCULATION: Visualized vertebral arteries patent without stenosis. Left vertebral artery dominant. Both PICA patent at their origins. Basilar patent without stenosis. Superior cerebellar arteries patent bilaterally. Both PCAs primarily supplied via the basilar. Mild atheromatous irregularity about the PCAs with associated mild bilateral P2 stenoses. PCAs remain patent to their distal aspects. No intracranial aneurysm. MRA NECK FINDINGS: AORTIC ARCH: Examination mildly degraded by motion and lack of IV contrast. Visualized aortic arch within normal limits for caliber standard branch pattern. No visible stenosis about the origin of the great vessels. RIGHT CAROTID SYSTEM: Right common and internal carotid arteries are patent with antegrade flow. No visible dissection. Atheromatous change about the right carotid bulb/proximal right ICA with associated stenosis of up to 60 percent by NASCET criteria. LEFT CAROTID SYSTEM: Left CCA grossly patent at its origin, and remains grossly patent to the bifurcation. Left ICA appears occluded at its origin at the bifurcation. Persisted perfusion within the left ECA and its major branches. VERTEBRAL ARTERIES: Both vertebral arteries arise from subclavian arteries. Left vertebral artery dominant. Vertebral arteries are patent with antegrade flow. No visible to stenosis or dissection. Other: None. IMPRESSION: MRI HEAD IMPRESSION: 1. No acute intracranial abnormality. 2. Underlying age-related cerebral atrophy with mild-to-moderate chronic microvascular ischemic  disease, with remote right basal ganglia lacunar infarct. 3. Few small meningiomas measuring up to 1.1 cm along the falx and overlying the left frontal convexity as above. No associated mass effect or edema. MRA HEAD IMPRESSION: 1. Loss of normal flow void within the left ICA to the siphon, consistent with occlusion as seen on corresponding MRA of the neck. Distal reconstitution at the left siphon with patent but attenuated flow within the left MCA distribution. 2. Otherwise wide patency of the intracranial arterial circulation. No other hemodynamically significant or correctable  stenosis. MRA NECK IMPRESSION: 1. 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 Left ICA remains largely occluded to the skull base. 2. Atheromatous change about the right carotid bulb/proximal right ICA with associated stenosis of up to 60 percent by NASCET criteria. 3. Wide patency of both vertebral arteries within the neck. Left vertebral artery dominant. Electronically Signed   By: Morene Hoard M.D.   On: 04/08/2024 23:00   Result Date: 04/08/2024 CLINICAL DATA:  Initial evaluation for acute neuro deficit, stroke suspected. EXAM: MRI HEAD WITHOUT CONTRAST MRA HEAD WITHOUT CONTRAST MRA NECK WITHOUT CONTRAST TECHNIQUE: Multiplanar, multiecho pulse sequences of the brain and surrounding structures were obtained without intravenous contrast. Angiographic images of the Circle of Willis were obtained using MRA technique without intravenous contrast. Angiographic images of the neck were obtained using MRA technique without intravenous contrast. Carotid stenosis measurements (when applicable) are obtained utilizing NASCET criteria, using the distal internal carotid diameter as the denominator. COMPARISON:  None Available. FINDINGS: MRI HEAD FINDINGS Brain: Diffuse prominence of the CSF containing spaces compatible generalized cerebral atrophy. Patchy T2/FLAIR hyperintensity involving the periventricular deep white matter both  cerebral hemispheres, consistent with chronic small vessel ischemic disease, mild-to-moderate in nature. Remote lacunar infarct present at the anterior right basal ganglia. Probable small remote right parietal infarct (series 12, image 32). No evidence for acute or subacute infarct. Gray-white matter adjacent maintained. No acute or chronic intracranial hemorrhage. 1.1 cm meningioma present along the falx without significant mass effect or edema (series 6, image 18). Additional small 1 cm meningioma overlies the left frontal convexity without mass effect or edema (series 6, image 17). No other mass lesion, mass effect, or midline shift. No hydrocephalus or extra-axial fluid collection. Vascular: Loss of normal flow void within the left ICA to the siphon, consistent with slow flow and/or occlusion. Major intracranial vascular flow voids are otherwise maintained. Skull and upper cervical spine: Cranial junction within normal limits. Bone marrow signal intensity overall within normal limits. No scalp soft tissue abnormality. Sinuses/Orbits: Globes orbital soft tissues within normal limits. Paranasal sinuses are largely clear. No mastoid effusion. Other: None. MRA HEAD FINDINGS ANTERIOR CIRCULATION: POSTERIOR CIRCULATION: MRA NECK FINDINGS Electronically Signed: By: Morene Hoard M.D. On: 04/08/2024 22:32   MR ANGIO NECK WO CONTRAST Addendum Date: 04/08/2024 ADDENDUM REPORT: 04/08/2024 23:00 ADDENDUM: Please note that this examination was inadvertently signed off prior to completing the initial dictation. The following addendum includes findings for the MRA portions of the head and neck as well as the overall impression for these studies. MRA HEAD FINDINGS: ANTERIOR CIRCULATION: Right ICA widely patent the siphon without stenosis. Irregular attenuated intermittent flow seen within the visualized distal cervical left ICA, which largely occludes at the skull base. Attenuated distal reconstitution at the left  carotid siphon the of the left ophthalmic artery. Right A1 segment patent. Left A1 attenuated but patent. Normal anterior communicating complex. Both ACAs patent without visible stenosis. Right M1 segment widely patent. Atheromatous irregularity about proximal M2 branch with mild stenoses, inferior division (series 1026, image 6). Right MCA branches otherwise widely patent. Attenuated but patent flow within the left M1 segment and distal left MCA branches. POSTERIOR CIRCULATION: Visualized vertebral arteries patent without stenosis. Left vertebral artery dominant. Both PICA patent at their origins. Basilar patent without stenosis. Superior cerebellar arteries patent bilaterally. Both PCAs primarily supplied via the basilar. Mild atheromatous irregularity about the PCAs with associated mild bilateral P2 stenoses. PCAs remain patent to their distal aspects. No intracranial aneurysm. MRA  NECK FINDINGS: AORTIC ARCH: Examination mildly degraded by motion and lack of IV contrast. Visualized aortic arch within normal limits for caliber standard branch pattern. No visible stenosis about the origin of the great vessels. RIGHT CAROTID SYSTEM: Right common and internal carotid arteries are patent with antegrade flow. No visible dissection. Atheromatous change about the right carotid bulb/proximal right ICA with associated stenosis of up to 60 percent by NASCET criteria. LEFT CAROTID SYSTEM: Left CCA grossly patent at its origin, and remains grossly patent to the bifurcation. Left ICA appears occluded at its origin at the bifurcation. Persisted perfusion within the left ECA and its major branches. VERTEBRAL ARTERIES: Both vertebral arteries arise from subclavian arteries. Left vertebral artery dominant. Vertebral arteries are patent with antegrade flow. No visible to stenosis or dissection. Other: None. IMPRESSION: MRI HEAD IMPRESSION: 1. No acute intracranial abnormality. 2. 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 3. Few small meningiomas measuring up to 1.1 cm along the falx and overlying the left frontal convexity as above. No associated mass effect or edema. MRA HEAD IMPRESSION: 1. Loss of normal flow void within the left ICA to the siphon, consistent with occlusion as seen on corresponding MRA of the neck. Distal reconstitution at the left siphon with patent but attenuated flow within the left MCA distribution. 2. Otherwise wide patency of the intracranial arterial circulation. No other hemodynamically significant or correctable stenosis. MRA NECK IMPRESSION: 1. 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 Left ICA remains largely occluded to the skull base. 2. Atheromatous change about the right carotid bulb/proximal right ICA with associated stenosis of up to 60 percent by NASCET criteria. 3. Wide patency of both vertebral arteries within the neck. Left vertebral artery dominant. Electronically Signed   By: Morene Hoard M.D.   On: 04/08/2024 23:00   Result Date: 04/08/2024 CLINICAL DATA:  Initial evaluation for acute neuro deficit, stroke suspected. EXAM: MRI HEAD WITHOUT CONTRAST MRA HEAD WITHOUT CONTRAST MRA NECK WITHOUT CONTRAST TECHNIQUE: Multiplanar, multiecho pulse sequences of the brain and surrounding structures were obtained without intravenous contrast. Angiographic images of the Circle of Willis were obtained using MRA technique without intravenous contrast. Angiographic images of the neck were obtained using MRA technique without intravenous contrast. Carotid stenosis measurements (when applicable) are obtained utilizing NASCET criteria, using the distal internal carotid diameter as the denominator. COMPARISON:  None Available. FINDINGS: MRI HEAD FINDINGS Brain: Diffuse prominence of the CSF containing spaces compatible generalized cerebral atrophy. Patchy T2/FLAIR hyperintensity  involving the periventricular deep white matter both cerebral hemispheres, consistent with chronic small vessel ischemic disease, mild-to-moderate in nature. Remote lacunar infarct present at the anterior right basal ganglia. Probable small remote right parietal infarct (series 12, image 32). No evidence for acute or subacute infarct. Gray-white matter adjacent maintained. No acute or chronic intracranial hemorrhage. 1.1 cm meningioma present along the falx without significant mass effect or edema (series 6, image 18). Additional small 1 cm meningioma overlies the left frontal convexity without mass effect or edema (series 6, image 17). No other mass lesion, mass effect, or midline shift. No hydrocephalus or extra-axial fluid collection. Vascular: Loss of normal flow void within the left ICA to the siphon, consistent with slow flow and/or occlusion. Major intracranial vascular flow voids are otherwise maintained. Skull and upper cervical spine: Cranial junction within normal limits. Bone marrow signal intensity overall within normal limits. No scalp soft tissue abnormality. Sinuses/Orbits: Globes orbital soft tissues  within normal limits. Paranasal sinuses are largely clear. No mastoid effusion. Other: None. MRA HEAD FINDINGS ANTERIOR CIRCULATION: POSTERIOR CIRCULATION: MRA NECK FINDINGS Electronically Signed: By: Morene Hoard M.D. On: 04/08/2024 22:32   US  RENAL Result Date: 04/08/2024 CLINICAL DATA:  Acute renal injury EXAM: RENAL / URINARY TRACT ULTRASOUND COMPLETE COMPARISON:  None Available. FINDINGS: Right Kidney: Renal measurements: 8.6 x 5.6 x 5.1 cm. = volume: 128 mL. Echogenicity within normal limits. No mass or hydronephrosis visualized. Left Kidney: Renal measurements: 10.2 x 5.1 x 5.4 cm. = volume: 149 mL. Echogenicity within normal limits. No mass or hydronephrosis visualized. Bladder: Appears normal for degree of bladder distention. Other: Mild splenomegaly is noted IMPRESSION: Unremarkable  kidneys. Mild splenomegaly. Electronically Signed   By: Oneil Devonshire M.D.   On: 04/08/2024 20:37   US  Abdomen Limited RUQ (LIVER/GB) Result Date: 04/08/2024 CLINICAL DATA:  Elevated liver enzymes. EXAM: ULTRASOUND ABDOMEN LIMITED RIGHT UPPER QUADRANT COMPARISON:  None Available. FINDINGS: Gallbladder: No gallstones or wall thickening visualized. No sonographic Murphy sign noted by sonographer. Apparent tiny nonshadowing and nonmobile nodules measure up to 5 mm, likely polyps. Common bile duct: Diameter: 5 mm Liver: There is diffuse increased liver echogenicity most commonly seen in the setting of fatty infiltration. Superimposed inflammation or fibrosis is not excluded. Clinical correlation is recommended. Portal vein is patent on color Doppler imaging with normal direction of blood flow towards the liver. Other: None. IMPRESSION: 1. No gallstone. 2. Tiny gallbladder polyps measure up to 5 mm. 3. Fatty liver. Electronically Signed   By: Vanetta Chou M.D.   On: 04/08/2024 20:35   DG Foot Complete Left Result Date: 04/08/2024 CLINICAL DATA:  Plantar wound. EXAM: LEFT FOOT - COMPLETE 3+ VIEW COMPARISON:  Left foot radiograph dated 10/15/2016. 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 No acute fracture or dislocation. The bones are osteopenic. There is ulceration of the plantar skin at the fifth metatarsal. IMPRESSION: 1. No acute fracture or dislocation. 2. Plantar skin ulceration. Electronically Signed   By: Vanetta Chou M.D.   On: 04/08/2024 17:15     Assessment/Plan: MSSA bacteremia secondary to left foot infection Left foot abscess with necrosis at the site of callus on the left foot S/p I/D, wound culture has been sent from the OR Repeat blood culture sent Pt is currently on cefazolin  and Flagyl .    2D echo has been done no obvious vegetation Will need TEE     TIA transient rt arm weakness-  resolved MRA - lt ICA occlusion at the bulb   Transminitis due to sepsis   T2DM A1c is 5.2   CKD   Discussed the management with the patient.

## 2024-04-10 NOTE — Progress Notes (Signed)
 Per Dr Kandis, dc [facility]/stroke orders

## 2024-04-10 NOTE — Plan of Care (Signed)
  Problem: Education: Goal: Ability to describe self-care measures that may prevent or decrease complications (Diabetes Survival Skills Education) will improve Outcome: Progressing Goal: Individualized Educational Video(s) Outcome: Progressing   Problem: Coping: Goal: Ability to adjust to condition or change in health will improve Outcome: Progressing   Problem: Health Behavior/Discharge Planning: Goal: Ability to manage health-related needs will improve Outcome: Progressing

## 2024-04-10 NOTE — Evaluation (Signed)
 Occupational Therapy Evaluation Patient Details Name: Joann [name] MRN: [id number] DOB: [birth year] Today's Date: 04/10/2024   History of Present Illness   Pt is an 78 y.o. female presenting to hospital 04/08/24 with c/o nausea and progressive generalized weakness; episode of acute weakness affecting R side while pt in ED (code stroke activated; presentation most consistent with TIA per chart; symptoms resolved).  Pt with all of her teeth extracted 7 weeks ago.  Pt admitted with TIA, sepsis (secondary to cellulitis vs acute osteomyelitis L foot), osteomyelitis, biliary infection, hypomagnesemia, hyponatremia, hypokalemia, AKI, DM.  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     Clinical Impressions Pt admitted with above diagnosis. Prior to admission, pt was ambulatory at household level with [facility], mod independent with ADL performance with spouse providing intermittent assist for LB dressing. Pt performs bed mobility with supervision, requires MAX A to don offloading shoe on L foot and MIN A to perform STS with max multimodal cuing for weight shift, hand placement and AD mgmt to step pivot to recliner. Pt often externally distracted by busy environment and has delayed processing, requiring simple, 1-step commands to be repeated for safety and sequencing. Pt would benefit from skilled OT services to address noted impairments and functional limitations (see below for any additional details) in order to maximize safety and independence while minimizing falls risk and caregiver burden. Anticipate the need for follow up [facility] OT services upon acute hospital DC.      If plan is discharge home, recommend the following:   A little help with walking and/or transfers;A little help with bathing/dressing/bathroom;Assist for transportation;Help with stairs or ramp for entrance;Supervision  due to cognitive status     Functional Status Assessment   Patient has had a recent decline in their functional status and demonstrates the ability to make significant improvements in function in a reasonable and predictable amount of time.     Equipment Recommendations   None recommended by OT      Precautions/Restrictions   Precautions Precautions: Fall Recall of Precautions/Restrictions: Intact Restrictions Weight Bearing Restrictions Per Provider Order: Yes LLE Weight Bearing Per Provider Order: Touchdown weight bearing (in post-op shoe) Other Position/Activity Restrictions: Per orders L foot to be heel WB / touch down for transfer     Mobility Bed Mobility Overal bed mobility: Needs Assistance Bed Mobility: Supine to Sit, Sit to Supine     Supine to sit: Supervision, HOB elevated Sit to supine: Supervision, HOB elevated        Transfers Overall transfer level: Needs assistance Equipment used: Rolling walker (2 wheels) Transfers: Sit to/from Stand, Bed to chair/wheelchair/BSC Sit to Stand: Min assist, From elevated surface     Step pivot transfers: Min assist, From elevated surface     General transfer comment: offloading L foot shoe donned prior to transfer, pt requires cues for hand placement and assist to power up. Poor safety awareness with pt leaving RW out in front and attempting to spontaneously sit, MIN A to pivot hips      Balance Overall balance assessment: Needs assistance Sitting-balance support: No upper extremity supported, Feet supported Sitting balance-Leahy Scale: Good Sitting balance - Comments: steady reaching within BOS   Standing balance support: During functional activity, Bilateral upper extremity supported, Reliant on assistive device for balance Standing balance-Leahy Scale: Fair  ADL either performed or assessed with clinical judgement   ADL Overall ADL's : Needs  assistance/impaired Eating/Feeding: Independent;Sitting   Grooming: Sitting;Independent   Upper Body Bathing: Sitting;Set up   Lower Body Bathing: Sit to/from stand;Set up   Upper Body Dressing : Sitting;Set up   Lower Body Dressing: Sit to/from stand;Set up;Sitting/lateral leans Lower Body Dressing Details (indicate cue type and reason): offloading L shoe donned while pt sitting EOB Toilet Transfer: Minimal assistance;BSC/3in1;Rolling walker (2 wheels) Toilet Transfer Details (indicate cue type and reason): simulated to recliner, pt requires max multimodal cuing for hand placement, is able to rise upright with minA, maintains WB through heel to step/hop, pt requires cues for step-by-step sequencing and abandons AD end of transfer, requiring MIN A to pivot hips to sit in recliner Toileting- Clothing Manipulation and Hygiene: Minimal assistance;Sit to/from stand       Functional mobility during ADLs: Minimal assistance;Cueing for sequencing;Cueing for safety;Rolling walker (2 wheels)       Vision Baseline Vision/History: 1 Wears glasses Ability to See in Adequate Light: 0 Adequate Patient Visual Report: No change from baseline Vision Assessment?: Wears glasses for reading            Pertinent Vitals/Pain Pain Assessment Pain Assessment: No/denies pain Pain Score: 0-No pain Pain Intervention(s): Limited activity within patient's tolerance     Extremity/Trunk Assessment Upper Extremity Assessment Upper Extremity Assessment: Generalized weakness;Right hand dominant (grossly WFL BUE ROM, fair bilateral grip strength, no focal deficits appreciated, 3+/5 MMT with [facility] coordination.)   Lower Extremity Assessment Lower Extremity Assessment: Defer to PT evaluation;LLE deficits/detail LLE:  (L foot wrapped in dressing and ace bandage)   Cervical / Trunk Assessment Cervical / Trunk Assessment: Normal   Communication Communication Communication: No apparent difficulties    Cognition Arousal: Alert Behavior During Therapy: WFL for tasks assessed/performed Cognition: No family/caregiver present to determine baseline, Cognition impaired           Executive functioning impairment (select all impairments): Problem solving OT - Cognition Comments: pt externally distracted (busy environment)                 Following commands: Impaired Following commands impaired: Follows one step commands inconsistently     Cueing  General Comments   Cueing Techniques: Verbal cues;Tactile cues;Visual cues  VSS on RA, NT in room to assess vitals, pt denies pain           Home Living Family/patient expects to be discharged to:: Private residence Living Arrangements: Spouse/significant other Available Help at Discharge: Family;Available 24 hours/day Type of Home: House Home Access: Stairs to enter [employer] of Steps: 3 Entrance Stairs-Rails: Left Home Layout: One level;Laundry or work area in basement     [employer]: Producer, television/film/video: Handicapped height Bathroom Accessibility: Yes How Accessible: Accessible via wheelchair;Accessible via walker Home Equipment: Agricultural consultant (2 wheels);Cane - single point;BSC/3in1;Grab bars - toilet;Grab bars - tub/shower          Prior Functioning/Environment Prior Level of Function : Independent/Modified Independent             Mobility Comments: Modified independent ambulating with SPC. ADLs Comments: mod independent, used SPC. Spouse occassionaly assisted with LB ADLs    OT Problem List: Decreased strength;Decreased range of motion;Decreased activity tolerance;Impaired balance (sitting and/or standing)   OT Treatment/Interventions: Self-care/ADL training;Therapeutic exercise;Neuromuscular education;Balance training;Patient/family education;DME and/or AE instruction;Therapeutic activities      OT Goals(Current goals can be found in the care plan section)  Acute  Rehab OT Goals OT Goal Formulation: With patient Time For Goal Achievement: 04/24/24 Potential to Achieve Goals: Good   OT Frequency:  Min 2X/week       AM-PAC OT 6 Clicks Daily Activity     Outcome Measure Help from another person eating meals?: None Help from another person taking care of personal grooming?: None Help from another person toileting, which includes using toliet, bedpan, or urinal?: A Lot Help from another person bathing (including washing, rinsing, drying)?: A Lot Help from another person to put on and taking off regular upper body clothing?: A Little Help from another person to put on and taking off regular lower body clothing?: A Lot 6 Click Score: 17   End of Session Equipment Utilized During Treatment: Rolling walker (2 wheels);Gait belt;Other (comment) (L post op/offloading shoe) Nurse Communication: Mobility status;Precautions;Weight bearing status  Activity Tolerance: Patient tolerated treatment well;No increased pain Patient left: in chair;with call bell/phone within reach;with chair alarm set;with nursing/sitter in room  OT Visit Diagnosis: Unsteadiness on feet (R26.81);Other abnormalities of gait and mobility (R26.89);Muscle weakness (generalized) (M62.81)                Time: 8883-8853 OT Time Calculation (min): 30 min Charges:  OT General Charges $OT Visit: 1 Visit OT Evaluation $OT Eval Low Complexity: 1 Low Tyniah Kastens L. Ronney [employer], OTR/L  04/10/24, 2:08 PM

## 2024-04-10 NOTE — Progress Notes (Addendum)
 PROGRESS NOTE    Clela Hagadorn  FMW:[id number] DOB: May 28, 1946 DOA: 04/08/2024 PCP: Rudolpho Norleen BIRCH, MD     Brief Narrative:   From admission h and p  Ms. Mickler is an 78 year old female with a past medical history of type 2 diabetes mellitus hyperlipidemia hypertension who presents to the emergency department with generalized malaise and weakness, the patient reports since having her teeth extracted 7 weeks ago she has had decreased oral intake and increasing systemic symptoms.  With the patient's sister as well as husband at the bedside and assists with the history.  Interestingly while awaiting triage for the systemic symptoms the patient developed acute right sided transient weakness code stroke was called and the patient was evaluated for TIA, symptoms completely resolved.   When taking history of the patient only reports absence of fever or chills with increasing global weakness and decrease in p.o. intake had to directly ask about her left foot wound which reportedly has been present and worsening from 3 days ago which the husband stated started out as a callus the patient denies any abdominal pain.   Past medical records reviewed and summarized: Patient's Medicare annual wellness visit March 07, 2024: Comorbidities at that time type 2 diabetes mellitus hyperlipidemia hypertension history of breast cancer dermatitis, on hydrochlorothiazide  12.5 mg and metformin  as well as aspirin  potassium at that time   CT Head: 1. No acute intracranial abnormality.2. 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 Mild atrophy and white matter changes bilaterally. 4. Atherosclerotic calcifications in the cavernous carotid arteries bilaterally.   Assessment & Plan:   Principal Problem:   TIA (transient ischemic attack) Active Problems:   Sepsis (HCC)   Osteomyelitis (HCC)   Biliary infection   DM2 (diabetes mellitus, type 2) (HCC)    AKI (acute kidney injury) (HCC)   Protein calorie malnutrition (HCC)   Hypokalemia   Hyponatremia   Hypomagnesemia   Malnutrition of moderate degree  # Left foot osteomyelitis Probed to bone with podiatry, s/p I and D on 9/15 - cefazolin /flagyl  - podiatry following, care per them  # MSSA bacteremia - on cefazolin  currently - ID following - repeat blood cultures ordered - TTE read pending, cardiology (Dr. Dewane) made aware through secure chat will need TEE  # Sepsis By fever, leukocytosis. Hemodynamically stable, normal mentation - abx as above  # TIA Transient right arm weakness day of presentation. Neuro consulted. Neuroimaging negative. LDL but intolerant to statatins (hallucinations?) - dapt 3 2ks followed by plavix  monotherapy - trial atorvastatin , hallucinations is an odd side effect, likely unrelated - tele - PT tentatively advising home health - f/u tte  # Transaminitis Likely 2/2 sepsis. No abd pain. Ruq u/s shows normal small gallbladder, no ductal dilation. Resolved  # T2DM Euglycemic - SSI  # CKD 3a Stable - monitir  # HTN Bp low normal - bp meds on hold    DVT prophylaxis: lovenox  Code Status: full Family Communication: husband updated @ bedside 9/16  Level of care: Med-Surg Status is: Inpatient Remains inpatient appropriate because: severity of illness    Consultants:  ID, podiatry  Procedures: pending  Antimicrobials:  See above    Subjective: Mild left foot pain, otherwise feels well  Objective: Vitals:   04/09/24 1958 04/09/24 2350 04/10/24 0350 04/10/24 0750  BP: 91/65 (!) 93/51 98/60 (!) 106/55  Pulse: 61 66 70 (!) 48  Resp: 18 18 18 17   Temp: 98.3 F (36.8 C) 98.1 F (  36.7 C)  97.6 F (36.4 C)  TempSrc:    Oral  SpO2: 93% 94% 98% 95%  Weight:      Height:        Intake/Output Summary (Last 24 hours) at 04/10/2024 1110 Last data filed at 04/10/2024 0507 Gross per 24 hour  Intake 2496.49 ml  Output 5 ml  Net  2491.49 ml   Filed Weights   04/08/24 1410 04/08/24 2204 04/09/24 1219  Weight: 65.3 kg 71 kg 71 kg    Examination:  General exam: Appears calm and comfortable  Respiratory system: Clear to auscultation. Respiratory effort normal. Cardiovascular system: S1 & S2 heard, RRR.   Gastrointestinal system: Abdomen is nondistended, soft and nontender. No organomegaly or masses felt. Normal bowel sounds heard. Central nervous system: Alert and oriented. No focal neurological deficits. Extremities: Symmetric 5 x 5 power. Skin: bandage left foot From 9/14:  Psychiatry: Judgement and insight appear normal. Mood & affect appropriate.     Data Reviewed: I have personally reviewed following labs and imaging studies  CBC: Recent Labs  Lab 04/08/24 1413 04/08/24 1621 04/08/24 2330 04/09/24 0426 04/10/24 0517  WBC 14.1* 12.8* 8.9 8.4 6.7  NEUTROABS  --  11.5*  --   --   --   HGB 12.2 13.0 11.5* 11.1* 11.7*  HCT 36.7 38.5 34.7* 33.5* 35.3*  MCV 89.1 87.9 89.7 88.4 88.5  PLT 144* 163 123* 135* 153   Basic Metabolic Panel: Recent Labs  Lab 04/08/24 1438 04/08/24 2254 04/09/24 0426 04/10/24 0517  NA 134*  --  138 136  K 3.2*  --  3.8 3.8  CL 94*  --  100 102  CO2 27  --  26 23  GLUCOSE 172*  --  128* 160*  BUN 21  --  26* 30*  CREATININE 1.31* 1.11* 1.32* 1.13*  CALCIUM  9.5  --  8.3* 8.3*  MG 1.4*  --  2.5*  --   PHOS  --   --  3.3  --    GFR: Estimated Creatinine Clearance: 37.5 mL/min (A) (by C-G formula based on SCr of 1.13 mg/dL (H)). Liver Function Tests: Recent Labs  Lab 04/08/24 1438 04/09/24 0426 04/10/24 0517  AST 76* 51* 40  ALT 44 37 32  ALKPHOS 135* 101 91  BILITOT 2.7* 1.6* 1.0  PROT 7.1 6.1* 6.0*  ALBUMIN 3.0* 2.5* 2.2*   Recent Labs  Lab 04/08/24 1438  LIPASE 23   No results for input(s): AMMONIA in the last 168 hours. Coagulation Profile: Recent Labs  Lab 04/08/24 1438  INR 1.2   Cardiac Enzymes: No results for input(s): CKTOTAL,  CKMB, CKMBINDEX, TROPONINI in the last 168 hours. BNP (last 3 results) No results for input(s): PROBNP in the last 8760 hours. HbA1C: Recent Labs    04/08/24 2330  HGBA1C 5.2   CBG: Recent Labs  Lab 04/09/24 1057 04/09/24 1338 04/09/24 1605 04/09/24 2013 04/10/24 0723  GLUCAP 85 111* 163* 218* 165*   Lipid Profile: Recent Labs    04/09/24 0426  CHOL 203*  HDL 33*  LDLCALC 151*  TRIG 96  CHOLHDL 6.2   Thyroid Function Tests: No results for input(s): TSH, T4TOTAL, FREET4, T3FREE, THYROIDAB in the last 72 hours. Anemia Panel: No results for input(s): VITAMINB12, FOLATE, FERRITIN, TIBC, IRON, RETICCTPCT in the last 72 hours. Urine analysis:    Component Value Date/Time   COLORURINE YELLOW (A) 04/09/2024 2229   APPEARANCEUR HAZY (A) 04/09/2024 2229   LABSPEC 1.019 04/09/2024 2229  PHURINE 5.0 04/09/2024 2229   GLUCOSEU NEGATIVE 04/09/2024 2229   HGBUR NEGATIVE 04/09/2024 2229   BILIRUBINUR NEGATIVE 04/09/2024 2229   KETONESUR NEGATIVE 04/09/2024 2229   PROTEINUR NEGATIVE 04/09/2024 2229   NITRITE NEGATIVE 04/09/2024 2229   LEUKOCYTESUR NEGATIVE 04/09/2024 2229   Sepsis Labs: @LABRCNTIP (procalcitonin:4,lacticidven:4)  ) Recent Results (from the past 240 hours)  Resp panel by RT-PCR (RSV, Flu A&B, Covid) Anterior Nasal Swab     Status: None   Collection Time: 04/08/24  3:11 PM   Specimen: Anterior Nasal Swab  Result Value Ref Range Status   SARS Coronavirus 2 by RT PCR NEGATIVE NEGATIVE Final    Comment: (NOTE) SARS-CoV-2 target nucleic acids are NOT DETECTED.  The SARS-CoV-2 RNA is generally detectable in upper respiratory specimens during the acute phase of infection. The lowest concentration of SARS-CoV-2 viral copies this assay can detect is 138 copies/mL. A negative result does not preclude SARS-Cov-2 infection and should not be used as the sole basis for treatment or other patient management decisions. A negative result  may occur with  improper specimen collection/handling, submission of specimen other than nasopharyngeal swab, presence of viral mutation(s) within the areas targeted by this assay, and inadequate number of viral copies(<138 copies/mL). A negative result must be combined with clinical observations, patient history, and epidemiological information. The expected result is Negative.  Fact Sheet for Patients:  BloggerCourse.com  Fact Sheet for Healthcare Providers:  SeriousBroker.it  This test is no t yet approved or cleared by the [place]  FDA and  has been authorized for detection and/or diagnosis of SARS-CoV-2 by FDA under an Emergency Use Authorization (EUA). This EUA will remain  in effect (meaning this test can be used) for the duration of the COVID-19 declaration under Section 564(b)(1) of the Act, 21 U.S.C.section 360bbb-3(b)(1), unless the authorization is terminated  or revoked sooner.       Influenza A by PCR NEGATIVE NEGATIVE Final   Influenza B by PCR NEGATIVE NEGATIVE Final    Comment: (NOTE) The Xpert Xpress SARS-CoV-2/FLU/RSV plus assay is intended as an aid in the diagnosis of influenza from Nasopharyngeal swab specimens and should not be used as a sole basis for treatment. Nasal washings and aspirates are unacceptable for Xpert Xpress SARS-CoV-2/FLU/RSV testing.  Fact Sheet for Patients: BloggerCourse.com  Fact Sheet for Healthcare Providers: SeriousBroker.it  This test is not yet approved or cleared by the [place]  FDA and has been authorized for detection and/or diagnosis of SARS-CoV-2 by FDA under an Emergency Use Authorization (EUA). This EUA will remain in effect (meaning this test can be used) for the duration of the COVID-19 declaration under Section 564(b)(1) of the Act, 21 U.S.C. section 360bbb-3(b)(1), unless the authorization is terminated  or revoked.     Resp Syncytial Virus by PCR NEGATIVE NEGATIVE Final    Comment: (NOTE) Fact Sheet for Patients: BloggerCourse.com  Fact Sheet for Healthcare Providers: SeriousBroker.it  This test is not yet approved or cleared by the [place]  FDA and has been authorized for detection and/or diagnosis of SARS-CoV-2 by FDA under an Emergency Use Authorization (EUA). This EUA will remain in effect (meaning this test can be used) for the duration of the COVID-19 declaration under Section 564(b)(1) of the Act, 21 U.S.C. section 360bbb-3(b)(1), unless the authorization is terminated or revoked.  Performed at [facility], [address] [place], [place] [address]   Blood culture (routine x 2)     Status: Abnormal (Preliminary result)   Collection Time: 04/08/24  4:10 PM   Specimen: BLOOD  Result Value Ref Range Status   Specimen Description   Final    BLOOD LEFT ANTECUBITAL Performed at [facility], [address] [place], [place] [address]    Special Requests   Final    BOTTLES DRAWN AEROBIC AND ANAEROBIC Blood Culture adequate volume Performed at [facility], [address] [place], [place] [address]    Culture  Setup Time   Final    GRAM POSITIVE COCCI IN BOTH AEROBIC AND ANAEROBIC BOTTLES Organism ID to follow CRITICAL RESULT CALLED TO, READ BACK BY AND VERIFIED WITH: ALEX CHAPPELL PHARMD [id number] 04/09/24 HNM GRAM STAIN REVIEWED-AGREE WITH RESULT drt    Culture (A)  Final    STAPHYLOCOCCUS AUREUS SUSCEPTIBILITIES TO FOLLOW Performed at [facility], [address] [place], [place] [address]    Report Status PENDING  Incomplete  Blood Culture ID Panel (Reflexed)     Status: Abnormal   Collection Time: 04/08/24  4:10 PM  Result Value Ref Range Status   Enterococcus faecalis NOT DETECTED NOT DETECTED Final   Enterococcus Faecium NOT DETECTED NOT DETECTED Final   Listeria  monocytogenes NOT DETECTED NOT DETECTED Final   Staphylococcus species DETECTED (A) NOT DETECTED Final    Comment: CRITICAL RESULT CALLED TO, READ BACK BY AND VERIFIED WITH: ALEX CHAPPELL PHARMD [id number] 04/09/24 HNM    Staphylococcus aureus (BCID) DETECTED (A) NOT DETECTED Final    Comment: CRITICAL RESULT CALLED TO, READ BACK BY AND VERIFIED WITH: ALEX CHAPPELL PHARMD [id number] 04/09/24 HNM    Staphylococcus epidermidis NOT DETECTED NOT DETECTED Final   Staphylococcus lugdunensis NOT DETECTED NOT DETECTED Final   Streptococcus species NOT DETECTED NOT DETECTED Final   Streptococcus agalactiae NOT DETECTED NOT DETECTED Final   Streptococcus pneumoniae NOT DETECTED NOT DETECTED Final   Streptococcus pyogenes NOT DETECTED NOT DETECTED Final   A.calcoaceticus-baumannii NOT DETECTED NOT DETECTED Final   Bacteroides fragilis NOT DETECTED NOT DETECTED Final   Enterobacterales NOT DETECTED NOT DETECTED Final   Enterobacter cloacae complex NOT DETECTED NOT DETECTED Final   Escherichia coli NOT DETECTED NOT DETECTED Final   Klebsiella aerogenes NOT DETECTED NOT DETECTED Final   Klebsiella oxytoca NOT DETECTED NOT DETECTED Final   Klebsiella pneumoniae NOT DETECTED NOT DETECTED Final   Proteus species NOT DETECTED NOT DETECTED Final   Salmonella species NOT DETECTED NOT DETECTED Final   Serratia marcescens NOT DETECTED NOT DETECTED Final   Haemophilus influenzae NOT DETECTED NOT DETECTED Final   Neisseria meningitidis NOT DETECTED NOT DETECTED Final   Pseudomonas aeruginosa NOT DETECTED NOT DETECTED Final   Stenotrophomonas maltophilia NOT DETECTED NOT DETECTED Final   Candida albicans NOT DETECTED NOT DETECTED Final   Candida auris NOT DETECTED NOT DETECTED Final   Candida glabrata NOT DETECTED NOT DETECTED Final   Candida krusei NOT DETECTED NOT DETECTED Final   Candida parapsilosis NOT DETECTED NOT DETECTED Final   Candida tropicalis NOT DETECTED NOT DETECTED Final   Cryptococcus neoformans/gattii  NOT DETECTED NOT DETECTED Final   Meth resistant mecA/C and MREJ NOT DETECTED NOT DETECTED Final    Comment: Performed at [facility], [address] [place], [place] [address]  Blood culture (routine x 2)     Status: Abnormal (Preliminary result)   Collection Time: 04/08/24  4:21 PM   Specimen: BLOOD LEFT FOREARM  Result Value Ref Range Status   Specimen Description   Final    BLOOD LEFT FOREARM Performed at [facility], [address], [place], [place] [address]    Special Requests   Final    BOTTLES DRAWN AEROBIC AND ANAEROBIC Blood Culture adequate volume Performed at [facility], [address] [place], [place] [address]    Culture  Setup Time   Final    GRAM POSITIVE COCCI IN BOTH AEROBIC AND ANAEROBIC BOTTLES CRITICAL RESULT CALLED TO, READ BACK BY AND VERIFIED WITH: ALEX CHAPPELL PHARMD [id number] 04/09/24 HNM GRAM STAIN REVIEWED-AGREE WITH RESULT DRT Performed at [facility], [address], [place], [place] [address]    Culture STAPHYLOCOCCUS AUREUS (A)  Final   Report Status PENDING  Incomplete  Aerobic/Anaerobic Culture w Gram Stain (surgical/deep wound)     Status: None (Preliminary result)   Collection Time: 04/08/24 11:00 PM   Specimen: Wound  Result Value Ref Range Status   Specimen Description   Final    WOUND Performed at [facility], [address] [place], [place] [address]    Special Requests   Final    LEFT FOOT Performed at [facility], [address] [place], [place] [address]    Gram Stain   Final    RARE WBC PRESENT, PREDOMINANTLY PMN MODERATE GRAM POSITIVE COCCI RARE GRAM NEGATIVE RODS    Culture   Final    MODERATE GRAM NEGATIVE RODS CULTURE REINCUBATED FOR BETTER GROWTH SUSCEPTIBILITIES TO FOLLOW Performed at [facility], [address] [place], [place] [address]    Report Status PENDING  Incomplete  Aerobic/Anaerobic Culture w Gram Stain (surgical/deep wound)     Status: None  (Preliminary result)   Collection Time: 04/09/24 12:48 PM   Specimen: Wound; Tissue  Result Value Ref Range Status   Specimen Description   Final    TISSUE Performed at [facility], [address], [place], [place] [address]    Special Requests   Final    LEFT FOOT DEEP WOUND Performed at [facility], [address] [place], [place] [address]    Gram Stain NO WBC SEEN NO ORGANISMS SEEN   Final   Culture   Final    NO GROWTH < 12 HOURS Performed at [facility], [address], [place], [place] [address]    Report Status PENDING  Incomplete         Radiology Studies: MR FOOT LEFT WO CONTRAST Result Date: 04/09/2024 CLINICAL DATA:  Osteomyelitis, foot Plantar foot wound.  History of partial metatarsal amputations. EXAM: MRI OF THE LEFT FOOT WITHOUT CONTRAST TECHNIQUE: Multiplanar, multisequence MR imaging of the left forefoot was performed. No intravenous contrast was administered. COMPARISON:  Radiographs 04/08/2024 and 10/15/2016. MRI of the left forefoot 02/21/2019 and 12/11/2019. FINDINGS: Bones/Joint/Cartilage Postsurgical changes from amputation of the 2nd metatarsal at the metatarsal neck are unchanged from the previous MRI. Interval resection of the distal 5th metatarsal and base of the 5th proximal phalanx. No highly suspicious marrow signal abnormalities within the remaining 5th ray to suggest recurrent osteomyelitis. There is T2 hyperintensity within the proximal 2nd metatarsal without cortical destruction, possibly stress related. Nearby subcortical cyst formation in the 2nd and 3rd metatarsal bases and adjacent cuneiform bones. No evidence of acute fracture or dislocation. No significant joint effusions are identified. There are moderate midfoot degenerative changes and stable mild chronic flattening of the 3rd metatarsal head. Ligaments Intact Lisfranc ligament. The collateral ligaments of the remaining metatarsophalangeal joints appear intact. Muscles  and Tendons Chronic fatty forefoot muscular atrophy, similar to previous  study. No focal intramuscular fluid collection or acute tendon rupture identified. The abductor digiti minimi tendon is discontinuous at the level of the distal 5th metatarsal, likely related to previous amputation. Soft tissues Soft tissue wound along the plantar lateral aspect of the forefoot at the level of the 5th metatarsal amputation. There is an adjacent focal fluid collection along the discontinuous abductor digiti minimi tendon which measures approximately 1.7 x 0.9 x 1.5 cm. There are surrounding soft tissue inflammatory changes without other organized fluid collections. IMPRESSION: 1. Soft tissue wound along the plantar lateral aspect of the forefoot at the level of the 5th metatarsal amputation with adjacent focal fluid collection suspicious for a small abscess. 2. No specific evidence of recurrent osteomyelitis at this time. Postsurgical changes in the 5th ray as described. 3. T2 hyperintensity within the proximal 2nd metatarsal without cortical destruction, possibly stress related. Stable postsurgical changes from amputation of the 2nd metatarsal at the metatarsal neck. 4. Chronic fatty forefoot muscular atrophy. Electronically Signed   By: Elsie Perone M.D.   On: 04/09/2024 09:57   MR BRAIN WO CONTRAST Addendum Date: 04/08/2024 ADDENDUM REPORT: 04/08/2024 23:00 ADDENDUM: Please note that this examination was inadvertently signed off prior to completing the initial dictation. The following addendum includes findings for the MRA portions of the head and neck as well as the overall impression for these studies. MRA HEAD FINDINGS: ANTERIOR CIRCULATION: Right ICA widely patent the siphon without stenosis. Irregular attenuated intermittent flow seen within the visualized distal cervical left ICA, which largely occludes at the skull base. Attenuated distal reconstitution at the left carotid siphon the of the left ophthalmic  artery. Right A1 segment patent. Left A1 attenuated but patent. Normal anterior communicating complex. Both ACAs patent without visible stenosis. Right M1 segment widely patent. Atheromatous irregularity about proximal M2 branch with mild stenoses, inferior division (series 1026, image 6). Right MCA branches otherwise widely patent. Attenuated but patent flow within the left M1 segment and distal left MCA branches. POSTERIOR CIRCULATION: Visualized vertebral arteries patent without stenosis. Left vertebral artery dominant. Both PICA patent at their origins. Basilar patent without stenosis. Superior cerebellar arteries patent bilaterally. Both PCAs primarily supplied via the basilar. Mild atheromatous irregularity about the PCAs with associated mild bilateral P2 stenoses. PCAs remain patent to their distal aspects. No intracranial aneurysm. MRA NECK FINDINGS: AORTIC ARCH: Examination mildly degraded by motion and lack of IV contrast. Visualized aortic arch within normal limits for caliber standard branch pattern. No visible stenosis about the origin of the great vessels. RIGHT CAROTID SYSTEM: Right common and internal carotid arteries are patent with antegrade flow. No visible dissection. Atheromatous change about the right carotid bulb/proximal right ICA with associated stenosis of up to 60 percent by NASCET criteria. LEFT CAROTID SYSTEM: Left CCA grossly patent at its origin, and remains grossly patent to the bifurcation. Left ICA appears occluded at its origin at the bifurcation. Persisted perfusion within the left ECA and its major branches. VERTEBRAL ARTERIES: Both vertebral arteries arise from subclavian arteries. Left vertebral artery dominant. Vertebral arteries are patent with antegrade flow. No visible to stenosis or dissection. Other: None. IMPRESSION: MRI HEAD IMPRESSION: 1. No acute intracranial abnormality. 2. Underlying age-related cerebral atrophy with mild-to-moderate chronic microvascular ischemic  disease, with remote right basal ganglia lacunar infarct. 3. Few small meningiomas measuring up to 1.1 cm along the falx and overlying the left frontal convexity as above. No associated mass effect or edema. MRA HEAD IMPRESSION: 1. Loss of normal flow void within the  left ICA to the siphon, consistent with occlusion as seen on corresponding MRA of the neck. Distal reconstitution at the left siphon with patent but attenuated flow within the left MCA distribution. 2. Otherwise wide patency of the intracranial arterial circulation. No other hemodynamically significant or correctable stenosis. MRA NECK IMPRESSION: 1. 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 Left ICA remains largely occluded to the skull base. 2. Atheromatous change about the right carotid bulb/proximal right ICA with associated stenosis of up to 60 percent by NASCET criteria. 3. Wide patency of both vertebral arteries within the neck. Left vertebral artery dominant. Electronically Signed   By: Morene Hoard M.D.   On: 04/08/2024 23:00   Result Date: 04/08/2024 CLINICAL DATA:  Initial evaluation for acute neuro deficit, stroke suspected. EXAM: MRI HEAD WITHOUT CONTRAST MRA HEAD WITHOUT CONTRAST MRA NECK WITHOUT CONTRAST TECHNIQUE: Multiplanar, multiecho pulse sequences of the brain and surrounding structures were obtained without intravenous contrast. Angiographic images of the Circle of Willis were obtained using MRA technique without intravenous contrast. Angiographic images of the neck were obtained using MRA technique without intravenous contrast. Carotid stenosis measurements (when applicable) are obtained utilizing NASCET criteria, using the distal internal carotid diameter as the denominator. COMPARISON:  None Available. FINDINGS: MRI HEAD FINDINGS Brain: Diffuse prominence of the CSF containing spaces compatible generalized cerebral atrophy. Patchy T2/FLAIR hyperintensity involving the periventricular deep white matter both  cerebral hemispheres, consistent with chronic small vessel ischemic disease, mild-to-moderate in nature. Remote lacunar infarct present at the anterior right basal ganglia. Probable small remote right parietal infarct (series 12, image 32). No evidence for acute or subacute infarct. Gray-white matter adjacent maintained. No acute or chronic intracranial hemorrhage. 1.1 cm meningioma present along the falx without significant mass effect or edema (series 6, image 18). Additional small 1 cm meningioma overlies the left frontal convexity without mass effect or edema (series 6, image 17). No other mass lesion, mass effect, or midline shift. No hydrocephalus or extra-axial fluid collection. Vascular: Loss of normal flow void within the left ICA to the siphon, consistent with slow flow and/or occlusion. Major intracranial vascular flow voids are otherwise maintained. Skull and upper cervical spine: Cranial junction within normal limits. Bone marrow signal intensity overall within normal limits. No scalp soft tissue abnormality. Sinuses/Orbits: Globes orbital soft tissues within normal limits. Paranasal sinuses are largely clear. No mastoid effusion. Other: None. MRA HEAD FINDINGS ANTERIOR CIRCULATION: POSTERIOR CIRCULATION: MRA NECK FINDINGS Electronically Signed: By: Morene Hoard M.D. On: 04/08/2024 22:32   MR ANGIO HEAD WO CONTRAST Addendum Date: 04/08/2024 ADDENDUM REPORT: 04/08/2024 23:00 ADDENDUM: Please note that this examination was inadvertently signed off prior to completing the initial dictation. The following addendum includes findings for the MRA portions of the head and neck as well as the overall impression for these studies. MRA HEAD FINDINGS: ANTERIOR CIRCULATION: Right ICA widely patent the siphon without stenosis. Irregular attenuated intermittent flow seen within the visualized distal cervical left ICA, which largely occludes at the skull base. Attenuated distal reconstitution at the left  carotid siphon the of the left ophthalmic artery. Right A1 segment patent. Left A1 attenuated but patent. Normal anterior communicating complex. Both ACAs patent without visible stenosis. Right M1 segment widely patent. Atheromatous irregularity about proximal M2 branch with mild stenoses, inferior division (series 1026, image 6). Right MCA branches otherwise widely patent. Attenuated but patent flow within the left M1 segment and distal left MCA branches. POSTERIOR CIRCULATION: Visualized vertebral arteries patent without stenosis. Left vertebral  artery dominant. Both PICA patent at their origins. Basilar patent without stenosis. Superior cerebellar arteries patent bilaterally. Both PCAs primarily supplied via the basilar. Mild atheromatous irregularity about the PCAs with associated mild bilateral P2 stenoses. PCAs remain patent to their distal aspects. No intracranial aneurysm. MRA NECK FINDINGS: AORTIC ARCH: Examination mildly degraded by motion and lack of IV contrast. Visualized aortic arch within normal limits for caliber standard branch pattern. No visible stenosis about the origin of the great vessels. RIGHT CAROTID SYSTEM: Right common and internal carotid arteries are patent with antegrade flow. No visible dissection. Atheromatous change about the right carotid bulb/proximal right ICA with associated stenosis of up to 60 percent by NASCET criteria. LEFT CAROTID SYSTEM: Left CCA grossly patent at its origin, and remains grossly patent to the bifurcation. Left ICA appears occluded at its origin at the bifurcation. Persisted perfusion within the left ECA and its major branches. VERTEBRAL ARTERIES: Both vertebral arteries arise from subclavian arteries. Left vertebral artery dominant. Vertebral arteries are patent with antegrade flow. No visible to stenosis or dissection. Other: None. IMPRESSION: MRI HEAD IMPRESSION: 1. No acute intracranial abnormality. 2. 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 3. Few small meningiomas measuring up to 1.1 cm along the falx and overlying the left frontal convexity as above. No associated mass effect or edema. MRA HEAD IMPRESSION: 1. Loss of normal flow void within the left ICA to the siphon, consistent with occlusion as seen on corresponding MRA of the neck. Distal reconstitution at the left siphon with patent but attenuated flow within the left MCA distribution. 2. Otherwise wide patency of the intracranial arterial circulation. No other hemodynamically significant or correctable stenosis. MRA NECK IMPRESSION: 1. 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 Left ICA remains largely occluded to the skull base. 2. Atheromatous change about the right carotid bulb/proximal right ICA with associated stenosis of up to 60 percent by NASCET criteria. 3. Wide patency of both vertebral arteries within the neck. Left vertebral artery dominant. Electronically Signed   By: Morene Hoard M.D.   On: 04/08/2024 23:00   Result Date: 04/08/2024 CLINICAL DATA:  Initial evaluation for acute neuro deficit, stroke suspected. EXAM: MRI HEAD WITHOUT CONTRAST MRA HEAD WITHOUT CONTRAST MRA NECK WITHOUT CONTRAST TECHNIQUE: Multiplanar, multiecho pulse sequences of the brain and surrounding structures were obtained without intravenous contrast. Angiographic images of the Circle of Willis were obtained using MRA technique without intravenous contrast. Angiographic images of the neck were obtained using MRA technique without intravenous contrast. Carotid stenosis measurements (when applicable) are obtained utilizing NASCET criteria, using the distal internal carotid diameter as the denominator. COMPARISON:  None Available. FINDINGS: MRI HEAD FINDINGS Brain: Diffuse prominence of the CSF containing spaces compatible generalized cerebral atrophy. Patchy T2/FLAIR hyperintensity  involving the periventricular deep white matter both cerebral hemispheres, consistent with chronic small vessel ischemic disease, mild-to-moderate in nature. Remote lacunar infarct present at the anterior right basal ganglia. Probable small remote right parietal infarct (series 12, image 32). No evidence for acute or subacute infarct. Gray-white matter adjacent maintained. No acute or chronic intracranial hemorrhage. 1.1 cm meningioma present along the falx without significant mass effect or edema (series 6, image 18). Additional small 1 cm meningioma overlies the left frontal convexity without mass effect or edema (series 6, image 17). No other mass lesion, mass effect, or midline shift. No hydrocephalus or extra-axial fluid collection. Vascular: Loss of normal flow void within the  left ICA to the siphon, consistent with slow flow and/or occlusion. Major intracranial vascular flow voids are otherwise maintained. Skull and upper cervical spine: Cranial junction within normal limits. Bone marrow signal intensity overall within normal limits. No scalp soft tissue abnormality. Sinuses/Orbits: Globes orbital soft tissues within normal limits. Paranasal sinuses are largely clear. No mastoid effusion. Other: None. MRA HEAD FINDINGS ANTERIOR CIRCULATION: POSTERIOR CIRCULATION: MRA NECK FINDINGS Electronically Signed: By: Morene Hoard M.D. On: 04/08/2024 22:32   MR ANGIO NECK WO CONTRAST Addendum Date: 04/08/2024 ADDENDUM REPORT: 04/08/2024 23:00 ADDENDUM: Please note that this examination was inadvertently signed off prior to completing the initial dictation. The following addendum includes findings for the MRA portions of the head and neck as well as the overall impression for these studies. MRA HEAD FINDINGS: ANTERIOR CIRCULATION: Right ICA widely patent the siphon without stenosis. Irregular attenuated intermittent flow seen within the visualized distal cervical left ICA, which largely occludes at the skull  base. Attenuated distal reconstitution at the left carotid siphon the of the left ophthalmic artery. Right A1 segment patent. Left A1 attenuated but patent. Normal anterior communicating complex. Both ACAs patent without visible stenosis. Right M1 segment widely patent. Atheromatous irregularity about proximal M2 branch with mild stenoses, inferior division (series 1026, image 6). Right MCA branches otherwise widely patent. Attenuated but patent flow within the left M1 segment and distal left MCA branches. POSTERIOR CIRCULATION: Visualized vertebral arteries patent without stenosis. Left vertebral artery dominant. Both PICA patent at their origins. Basilar patent without stenosis. Superior cerebellar arteries patent bilaterally. Both PCAs primarily supplied via the basilar. Mild atheromatous irregularity about the PCAs with associated mild bilateral P2 stenoses. PCAs remain patent to their distal aspects. No intracranial aneurysm. MRA NECK FINDINGS: AORTIC ARCH: Examination mildly degraded by motion and lack of IV contrast. Visualized aortic arch within normal limits for caliber standard branch pattern. No visible stenosis about the origin of the great vessels. RIGHT CAROTID SYSTEM: Right common and internal carotid arteries are patent with antegrade flow. No visible dissection. Atheromatous change about the right carotid bulb/proximal right ICA with associated stenosis of up to 60 percent by NASCET criteria. LEFT CAROTID SYSTEM: Left CCA grossly patent at its origin, and remains grossly patent to the bifurcation. Left ICA appears occluded at its origin at the bifurcation. Persisted perfusion within the left ECA and its major branches. VERTEBRAL ARTERIES: Both vertebral arteries arise from subclavian arteries. Left vertebral artery dominant. Vertebral arteries are patent with antegrade flow. No visible to stenosis or dissection. Other: None. IMPRESSION: MRI HEAD IMPRESSION: 1. No acute intracranial abnormality. 2.  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 3. Few small meningiomas measuring up to 1.1 cm along the falx and overlying the left frontal convexity as above. No associated mass effect or edema. MRA HEAD IMPRESSION: 1. Loss of normal flow void within the left ICA to the siphon, consistent with occlusion as seen on corresponding MRA of the neck. Distal reconstitution at the left siphon with patent but attenuated flow within the left MCA distribution. 2. Otherwise wide patency of the intracranial arterial circulation. No other hemodynamically significant or correctable stenosis. MRA NECK IMPRESSION: 1. 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 Left ICA remains largely occluded to the skull base. 2. Atheromatous change about the right carotid bulb/proximal right ICA with associated stenosis of up to 60 percent by NASCET criteria. 3. Wide patency of both vertebral arteries within the neck.  Left vertebral artery dominant. Electronically Signed   By: Morene Hoard M.D.   On: 04/08/2024 23:00   Result Date: 04/08/2024 CLINICAL DATA:  Initial evaluation for acute neuro deficit, stroke suspected. EXAM: MRI HEAD WITHOUT CONTRAST MRA HEAD WITHOUT CONTRAST MRA NECK WITHOUT CONTRAST TECHNIQUE: Multiplanar, multiecho pulse sequences of the brain and surrounding structures were obtained without intravenous contrast. Angiographic images of the Circle of Willis were obtained using MRA technique without intravenous contrast. Angiographic images of the neck were obtained using MRA technique without intravenous contrast. Carotid stenosis measurements (when applicable) are obtained utilizing NASCET criteria, using the distal internal carotid diameter as the denominator. COMPARISON:  None Available. FINDINGS: MRI HEAD FINDINGS Brain: Diffuse prominence of the CSF containing spaces compatible generalized cerebral  atrophy. Patchy T2/FLAIR hyperintensity involving the periventricular deep white matter both cerebral hemispheres, consistent with chronic small vessel ischemic disease, mild-to-moderate in nature. Remote lacunar infarct present at the anterior right basal ganglia. Probable small remote right parietal infarct (series 12, image 32). No evidence for acute or subacute infarct. Gray-white matter adjacent maintained. No acute or chronic intracranial hemorrhage. 1.1 cm meningioma present along the falx without significant mass effect or edema (series 6, image 18). Additional small 1 cm meningioma overlies the left frontal convexity without mass effect or edema (series 6, image 17). No other mass lesion, mass effect, or midline shift. No hydrocephalus or extra-axial fluid collection. Vascular: Loss of normal flow void within the left ICA to the siphon, consistent with slow flow and/or occlusion. Major intracranial vascular flow voids are otherwise maintained. Skull and upper cervical spine: Cranial junction within normal limits. Bone marrow signal intensity overall within normal limits. No scalp soft tissue abnormality. Sinuses/Orbits: Globes orbital soft tissues within normal limits. Paranasal sinuses are largely clear. No mastoid effusion. Other: None. MRA HEAD FINDINGS ANTERIOR CIRCULATION: POSTERIOR CIRCULATION: MRA NECK FINDINGS Electronically Signed: By: Morene Hoard M.D. On: 04/08/2024 22:32   US  RENAL Result Date: 04/08/2024 CLINICAL DATA:  Acute renal injury EXAM: RENAL / URINARY TRACT ULTRASOUND COMPLETE COMPARISON:  None Available. FINDINGS: Right Kidney: Renal measurements: 8.6 x 5.6 x 5.1 cm. = volume: 128 mL. Echogenicity within normal limits. No mass or hydronephrosis visualized. Left Kidney: Renal measurements: 10.2 x 5.1 x 5.4 cm. = volume: 149 mL. Echogenicity within normal limits. No mass or hydronephrosis visualized. Bladder: Appears normal for degree of bladder distention. Other: Mild  splenomegaly is noted IMPRESSION: Unremarkable kidneys. Mild splenomegaly. Electronically Signed   By: Oneil Devonshire M.D.   On: 04/08/2024 20:37   US  Abdomen Limited RUQ (LIVER/GB) Result Date: 04/08/2024 CLINICAL DATA:  Elevated liver enzymes. EXAM: ULTRASOUND ABDOMEN LIMITED RIGHT UPPER QUADRANT COMPARISON:  None Available. FINDINGS: Gallbladder: No gallstones or wall thickening visualized. No sonographic Murphy sign noted by sonographer. Apparent tiny nonshadowing and nonmobile nodules measure up to 5 mm, likely polyps. Common bile duct: Diameter: 5 mm Liver: There is diffuse increased liver echogenicity most commonly seen in the setting of fatty infiltration. Superimposed inflammation or fibrosis is not excluded. Clinical correlation is recommended. Portal vein is patent on color Doppler imaging with normal direction of blood flow towards the liver. Other: None. IMPRESSION: 1. No gallstone. 2. Tiny gallbladder polyps measure up to 5 mm. 3. Fatty liver. Electronically Signed   By: Vanetta Chou M.D.   On: 04/08/2024 20:35   DG Foot Complete Left Result Date: 04/08/2024 CLINICAL DATA:  Plantar wound. EXAM: LEFT FOOT - COMPLETE 3+ VIEW COMPARISON:  Left foot radiograph dated 10/15/2016. 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 No acute fracture or dislocation. The bones are osteopenic. There is ulceration of the plantar skin at the fifth metatarsal. IMPRESSION: 1. No acute fracture or dislocation. 2. Plantar skin ulceration. Electronically Signed   By: Vanetta Chou M.D.   On: 04/08/2024 17:15   CT HEAD CODE STROKE WO CONTRAST Result Date: 04/08/2024 EXAM: CT HEAD WITHOUT CONTRAST 04/08/2024 02:51:00 PM TECHNIQUE: CT of the head was performed without the administration of intravenous contrast. Automated exposure control, iterative reconstruction, and/or weight based adjustment of the mA/kV was utilized to  reduce the radiation dose to as low as reasonably achievable. COMPARISON: None available. CLINICAL HISTORY: Neuro deficit, acute, stroke suspected. Mental status change. FINDINGS: BRAIN AND VENTRICLES: A remote infarct is present in the anterior right frontal lobe with Wallerian degeneration extending inferiorly. Mild atrophy and white matter changes are present bilaterally. No acute hemorrhage. No evidence of acute infarct. No hydrocephalus. No extra-axial collection. No mass effect or midline shift. ORBITS: No acute abnormality. SINUSES: No acute abnormality. SOFT TISSUES AND SKULL: No acute soft tissue abnormality. No skull fracture. VASCULATURE: Atherosclerotic calcifications are present in the cavernous carotid arteries bilaterally. No hyperdense vessel is present. IMPRESSION: 1. No acute intracranial abnormality. 2. 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 3. Mild atrophy and white matter changes bilaterally. 4. Atherosclerotic calcifications in the cavernous carotid arteries bilaterally. Findings were communicated to Dr Matthews via the Am[employer] system at 2:58 pm Electronically signed by: Lonni Necessary MD 04/08/2024 03:00 PM EDT RP Workstation: [id number]        Scheduled Meds:  vitamin C   500 mg Oral BID   aspirin  EC  81 mg Oral Daily   clopidogrel   75 mg Oral Daily   enoxaparin  (LOVENOX ) injection  40 mg Subcutaneous Q24H   feeding supplement  237 mL Oral TID BM   insulin  aspart  0-5 Units Subcutaneous QHS   insulin  aspart  0-9 Units Subcutaneous TID WC   multivitamin with minerals  1 tablet Oral Daily   zinc  sulfate (50mg  elemental zinc )  220 mg Oral Daily   Continuous Infusions:   ceFAZolin  (ANCEF ) IV 2 g (04/10/24 0541)   metronidazole  500 mg (04/10/24 0828)     LOS: 2 days     Devaughn KATHEE Ban, MD [facility]   If 7PM-7AM, please contact night-coverage [email or web address] Password [email or web address] 04/10/2024, 11:10 AM

## 2024-04-10 NOTE — Progress Notes (Addendum)
 PODIATRY: PROGRESS NOTE    Surgery:  Procedure(s) (LRB): IRRIGATION AND DEBRIDEMENT WOUND (Left) POD:  1 Day Post-Op  O/N: NAEON  Subjective:  Patient resting comfortably at bedside with husband.  Denies F/C/N/V/SOB/CP. Denies acute calf pain.    PHYSICAL EXAMINATION: BP 103/60   Pulse (!) 56   Temp 98.4 F (36.9 C) (Oral)   Resp 18   Ht 5' 3 (1.6 m)   Wt 71 kg   SpO2 98%   BMI 27.73 kg/m ? GEN: NAD. AOX3. ? RESP: Non-labored breathing on RA.? ABD: NT/ND of all four quadrants.? NEURO: Moving all four extremities spontaneously. ? ? Physical Exam: Blood pressure (!) 101/50, pulse 79, temperature 99.2 F (37.3 C), temperature source Oral, resp. rate (!) 31, height 5' 5 (1.651 m), weight 65.3 kg, SpO2 98%.   Constitutional: Patient appears of normal development and good nutritional status for stated age, well-groomed, and of normal body habitus. Phonating appropriately.    Psychiatric: Alert and oriented to time, place, person and situation. The patient is noted to have good judgment and insight into the hospital visit   FOCUSED LOWER EXTREMITY EXAMINATION:    Neurological:  - Protective sensation diminished / absent - Gross protective sensation diminished bilaterally.  - No focal motor or sensory deficits identified bilaterally.    Vascular:  - Dorsalis Pedis artery on palpation: diminished - 1 - Posterior Tibial artery on palpation:  diminished - 1 - Capillary Filling Times: sluggish  - Peripheral edema: 1+ - Dependency Rubor: yes  - Varicosities: mod     Musculoskeletal:  Muscle strength: 5/5 in all 4 quadrants  Ankle Joint ROM: decreased, equinus noted Global foot - TTP: none   Dermatological:  - Skin quality: atrophic  - Interdigital spaces: c/d/i    #WOUND 1    Type: Full thickness neuropathic ulceration Location: plantar lateral wound - at aspect of where 5th met head would reside.  --- Wound base: 10% Granular ; 40% Fibrosis ; 50% Necrotic   Adjacent tissue/ Wound borders: hyperkeratotic  Undermining noted (Pre-debridement): yes; Direction: circumferentially with biggest area of tracking dorsally / laterally.  PTB: + yes  Malodor: no Active Drainage: no    Measurements: 2cm (l) x 2cm (w) x bone cm (2cm) (d)           Results for orders placed or performed during the hospital encounter of 04/08/24  Resp panel by RT-PCR (RSV, Flu A&B, Covid) Anterior Nasal Swab     Status: None   Collection Time: 04/08/24  3:11 PM   Specimen: Anterior Nasal Swab  Result Value Ref Range Status   SARS Coronavirus 2 by RT PCR NEGATIVE NEGATIVE Final    Comment: (NOTE) SARS-CoV-2 target nucleic acids are NOT DETECTED.  The SARS-CoV-2 RNA is generally detectable in upper respiratory specimens during the acute phase of infection. The lowest concentration of SARS-CoV-2 viral copies this assay can detect is 138 copies/mL. A negative result does not preclude SARS-Cov-2 infection and should not be used as the sole basis for treatment or other patient management decisions. A negative result may occur with  improper specimen collection/handling, submission of specimen other than nasopharyngeal swab, presence of viral mutation(s) within the areas targeted by this assay, and inadequate number of viral copies(<138 copies/mL). A negative result must be combined with clinical observations, patient history, and epidemiological information. The expected result is Negative.  Fact Sheet for Patients:  BloggerCourse.com  Fact Sheet for Healthcare Providers:  SeriousBroker.it  This test is no t  yet approved or cleared by the [place]  FDA and  has been authorized for detection and/or diagnosis of SARS-CoV-2 by FDA under an Emergency Use Authorization (EUA). This EUA will remain  in effect (meaning this test can be used) for the duration of the COVID-19 declaration under Section 564(b)(1) of  the Act, 21 U.S.C.section 360bbb-3(b)(1), unless the authorization is terminated  or revoked sooner.       Influenza A by PCR NEGATIVE NEGATIVE Final   Influenza B by PCR NEGATIVE NEGATIVE Final    Comment: (NOTE) The Xpert Xpress SARS-CoV-2/FLU/RSV plus assay is intended as an aid in the diagnosis of influenza from Nasopharyngeal swab specimens and should not be used as a sole basis for treatment. Nasal washings and aspirates are unacceptable for Xpert Xpress SARS-CoV-2/FLU/RSV testing.  Fact Sheet for Patients: BloggerCourse.com  Fact Sheet for Healthcare Providers: SeriousBroker.it  This test is not yet approved or cleared by the [place]  FDA and has been authorized for detection and/or diagnosis of SARS-CoV-2 by FDA under an Emergency Use Authorization (EUA). This EUA will remain in effect (meaning this test can be used) for the duration of the COVID-19 declaration under Section 564(b)(1) of the Act, 21 U.S.C. section 360bbb-3(b)(1), unless the authorization is terminated or revoked.     Resp Syncytial Virus by PCR NEGATIVE NEGATIVE Final    Comment: (NOTE) Fact Sheet for Patients: BloggerCourse.com  Fact Sheet for Healthcare Providers: SeriousBroker.it  This test is not yet approved or cleared by the [place]  FDA and has been authorized for detection and/or diagnosis of SARS-CoV-2 by FDA under an Emergency Use Authorization (EUA). This EUA will remain in effect (meaning this test can be used) for the duration of the COVID-19 declaration under Section 564(b)(1) of the Act, 21 U.S.C. section 360bbb-3(b)(1), unless the authorization is terminated or revoked.  Performed at [facility], [address] [place], [place] [address]   Blood culture (routine x 2)     Status: Abnormal (Preliminary result)   Collection Time: 04/08/24  4:10 PM   Specimen:  BLOOD  Result Value Ref Range Status   Specimen Description   Final    BLOOD LEFT ANTECUBITAL Performed at [facility], [address], [place], [place] [address]    Special Requests   Final    BOTTLES DRAWN AEROBIC AND ANAEROBIC Blood Culture adequate volume Performed at [facility], [address] [place], [place] [address]    Culture  Setup Time   Final    GRAM POSITIVE COCCI IN BOTH AEROBIC AND ANAEROBIC BOTTLES Organism ID to follow CRITICAL RESULT CALLED TO, READ BACK BY AND VERIFIED WITH: ALEX CHAPPELL PHARMD [id number] 04/09/24 HNM GRAM STAIN REVIEWED-AGREE WITH RESULT drt    Culture (A)  Final    STAPHYLOCOCCUS AUREUS SUSCEPTIBILITIES TO FOLLOW Performed at [facility], [address] [place], [place] [address]    Report Status PENDING  Incomplete  Blood Culture ID Panel (Reflexed)     Status: Abnormal   Collection Time: 04/08/24  4:10 PM  Result Value Ref Range Status   Enterococcus faecalis NOT DETECTED NOT DETECTED Final   Enterococcus Faecium NOT DETECTED NOT DETECTED Final   Listeria monocytogenes NOT DETECTED NOT DETECTED Final   Staphylococcus species DETECTED (A) NOT DETECTED Final    Comment: CRITICAL RESULT CALLED TO, READ BACK BY AND VERIFIED WITH: ALEX CHAPPELL PHARMD [id number] 04/09/24 HNM    Staphylococcus aureus (BCID) DETECTED (A) NOT DETECTED Final    Comment: CRITICAL  RESULT CALLED TO, READ BACK BY AND VERIFIED WITH: ALEX CHAPPELL PHARMD [id number] 04/09/24 HNM    Staphylococcus epidermidis NOT DETECTED NOT DETECTED Final   Staphylococcus lugdunensis NOT DETECTED NOT DETECTED Final   Streptococcus species NOT DETECTED NOT DETECTED Final   Streptococcus agalactiae NOT DETECTED NOT DETECTED Final   Streptococcus pneumoniae NOT DETECTED NOT DETECTED Final   Streptococcus pyogenes NOT DETECTED NOT DETECTED Final   A.calcoaceticus-baumannii NOT DETECTED NOT DETECTED Final   Bacteroides fragilis NOT DETECTED NOT DETECTED Final    Enterobacterales NOT DETECTED NOT DETECTED Final   Enterobacter cloacae complex NOT DETECTED NOT DETECTED Final   Escherichia coli NOT DETECTED NOT DETECTED Final   Klebsiella aerogenes NOT DETECTED NOT DETECTED Final   Klebsiella oxytoca NOT DETECTED NOT DETECTED Final   Klebsiella pneumoniae NOT DETECTED NOT DETECTED Final   Proteus species NOT DETECTED NOT DETECTED Final   Salmonella species NOT DETECTED NOT DETECTED Final   Serratia marcescens NOT DETECTED NOT DETECTED Final   Haemophilus influenzae NOT DETECTED NOT DETECTED Final   Neisseria meningitidis NOT DETECTED NOT DETECTED Final   Pseudomonas aeruginosa NOT DETECTED NOT DETECTED Final   Stenotrophomonas maltophilia NOT DETECTED NOT DETECTED Final   Candida albicans NOT DETECTED NOT DETECTED Final   Candida auris NOT DETECTED NOT DETECTED Final   Candida glabrata NOT DETECTED NOT DETECTED Final   Candida krusei NOT DETECTED NOT DETECTED Final   Candida parapsilosis NOT DETECTED NOT DETECTED Final   Candida tropicalis NOT DETECTED NOT DETECTED Final   Cryptococcus neoformans/gattii NOT DETECTED NOT DETECTED Final   Meth resistant mecA/C and MREJ NOT DETECTED NOT DETECTED Final    Comment: Performed at [facility], [address] [place], [place] [address]  Blood culture (routine x 2)     Status: Abnormal (Preliminary result)   Collection Time: 04/08/24  4:21 PM   Specimen: BLOOD LEFT FOREARM  Result Value Ref Range Status   Specimen Description   Final    BLOOD LEFT FOREARM Performed at [facility], [address], [place], [place] [address]    Special Requests   Final    BOTTLES DRAWN AEROBIC AND ANAEROBIC Blood Culture adequate volume Performed at [facility], [address] [place], [place] [address]    Culture  Setup Time   Final    GRAM POSITIVE COCCI IN BOTH AEROBIC AND ANAEROBIC BOTTLES CRITICAL RESULT CALLED TO, READ BACK BY AND VERIFIED WITH: ALEX CHAPPELL PHARMD [id number] 04/09/24  HNM GRAM STAIN REVIEWED-AGREE WITH RESULT DRT Performed at [facility], [address] [place], [place] [address]    Culture STAPHYLOCOCCUS AUREUS (A)  Final   Report Status PENDING  Incomplete  Aerobic/Anaerobic Culture w Gram Stain (surgical/deep wound)     Status: None (Preliminary result)   Collection Time: 04/08/24 11:00 PM   Specimen: Wound  Result Value Ref Range Status   Specimen Description   Final    WOUND Performed at [facility], [address] [place], [place] [address]    Special Requests   Final    LEFT FOOT Performed at [facility], [address] [place], [place] [address]    Gram Stain   Final    RARE WBC PRESENT, PREDOMINANTLY PMN MODERATE GRAM POSITIVE COCCI RARE GRAM NEGATIVE RODS    Culture   Final    MODERATE GRAM NEGATIVE RODS FEW PROTEUS MIRABILIS SUSCEPTIBILITIES TO FOLLOW Performed at [facility], [address], [place], [place] [address]  Report Status PENDING  Incomplete  Aerobic/Anaerobic Culture w Gram Stain (surgical/deep wound)     Status: None (Preliminary result)   Collection Time: 04/09/24 12:48 PM   Specimen: Wound; Tissue  Result Value Ref Range Status   Specimen Description   Final    TISSUE Performed at [facility], [address], [place], [place] [address]    Special Requests   Final    LEFT FOOT DEEP WOUND Performed at [facility], [address] [place], [place] [address]    Gram Stain NO WBC SEEN NO ORGANISMS SEEN   Final   Culture   Final    NO GROWTH < 12 HOURS Performed at [facility], [address], [place], [place] [address]    Report Status PENDING  Incomplete    ID Type Source Tests Collected by Time Destination  A : left foot deep wound Tissue Wound AEROBIC/ANAEROBIC CULTURE W GRAM STAIN (SURGICAL/DEEP WOUND) Tanda Greig MATSU, DPM 04/09/2024 1248      ASSESSMENT:?  Joann [name] is a 78 y.o. female ?  PLAN:? - Activity: Heel touch  down for transfer on the LLE in post op shoe  - Wound Care:  Materials: Packing strips / BETADINE / Gauze (4x4) / Kerlix / 4 inch ACE bandage Instructions: Packing to the LEFT wound with packing strips. Paint surrounding wound with betadine , cover with 4x4 gauze, ABD, and anchor all dressings with loosely placed kerlix/ ACE.  Supplement Reccs: Okay to reinforce with dry gauze / loose ACE overlap prn strikethrough.   - ABX: Appreciate assistance with antibiotic stewardship from medicine / pharmacy / ID services.  Anti-infectives (From admission, onward)    Start     Dose/Rate Route Frequency Ordered Stop   04/09/24 1800  ceFAZolin  (ANCEF ) IVPB 2g/100 mL premix        2 g 200 mL/hr over 30 Minutes Intravenous Every 8 hours 04/09/24 1631     04/09/24 0930  vancomycin  (VANCOREADY) IVPB 1250 mg/250 mL        1,250 mg 166.7 mL/hr over 90 Minutes Intravenous  Once 04/09/24 0839 04/09/24 1200   04/09/24 0045  ceFEPIme  (MAXIPIME ) 2 g in sodium chloride  0.9 % 100 mL IVPB  Status:  Discontinued        2 g 200 mL/hr over 30 Minutes Intravenous Every 12 hours 04/08/24 2347 04/09/24 1631   04/08/24 2200  cefTRIAXone  (ROCEPHIN ) 2 g in sodium chloride  0.9 % 100 mL IVPB  Status:  Discontinued        2 g 200 mL/hr over 30 Minutes Intravenous Every 24 hours 04/08/24 1952 04/08/24 2337   04/08/24 2002  vancomycin  variable dose per unstable renal function (pharmacist dosing)  Status:  Discontinued         Does not apply See admin instructions 04/08/24 2002 04/09/24 1631   04/08/24 2000  metroNIDAZOLE  (FLAGYL ) IVPB 500 mg        500 mg 100 mL/hr over 60 Minutes Intravenous Every 12 hours 04/08/24 1952     04/08/24 1630  vancomycin  (VANCOREADY) IVPB 1500 mg/300 mL  Status:  Discontinued        1,500 mg 150 mL/hr over 120 Minutes Intravenous  Once 04/08/24 1610 04/09/24 0839   04/08/24 1615  piperacillin -tazobactam (ZOSYN ) IVPB 3.375 g        3.375 g 12.5 mL/hr over 240 Minutes Intravenous  Once 04/08/24  1610 04/08/24 2047       - Dispo: pending bacteremia work up /  ABX plan.

## 2024-04-10 NOTE — Progress Notes (Signed)
*  PRELIMINARY RESULTS* Echocardiogram 2D Echocardiogram has been performed.  Joann [name] 04/10/2024, 8:02 AM

## 2024-04-10 NOTE — TOC Initial Note (Signed)
 Transition of Care [facility]) - Initial/Assessment Note    Patient Details  Name: Joann [name] MRN: [id number] Date of Birth: July 10, 1946  [facility]) CM/SW Contact:    Dalia GORMAN Fuse, RN Phone Number: 04/10/2024, 4:28 PM  Clinical Narrative:                  TOC completed review of the patient's medical record and brief assessment. No TOC needs, please outreach if needs identified.       Patient Goals and CMS Choice            Expected Discharge Plan and Services                                              Prior Living Arrangements/Services                       Activities of Daily Living   ADL Screening (condition at time of admission) Independently performs ADLs?: Yes (appropriate for developmental age) Is the patient deaf or have difficulty hearing?: No Does the patient have difficulty seeing, even when wearing glasses/contacts?: No Does the patient have difficulty concentrating, remembering, or making decisions?: No  Permission Sought/Granted                  Emotional Assessment              Admission diagnosis:  TIA (transient ischemic attack) [G45.9] Generalized weakness [R53.1] Sepsis due to cellulitis (HCC) [L03.90, A41.9] Patient Active Problem List   Diagnosis Date Noted   Malnutrition of moderate degree 04/09/2024   TIA (transient ischemic attack) 04/08/2024   Biliary infection 04/08/2024   AKI (acute kidney injury) (HCC) 04/08/2024   Protein calorie malnutrition (HCC) 04/08/2024   Hypokalemia 04/08/2024   Hyponatremia 04/08/2024   Hypomagnesemia 04/08/2024   DM2 (diabetes mellitus, type 2) (HCC) 10/27/2020   Hypertension 10/27/2020   Sepsis (HCC) 10/25/2020   Osteomyelitis (HCC) 10/24/2020   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 [place] [address] Phone: [phone] Fax: [phone]     Social Drivers of Health (SDOH) Social History: SDOH Screenings   Food Insecurity: No Food Insecurity (04/08/2024)  Housing: Low Risk  (04/08/2024)  Transportation Needs: No Transportation Needs (04/08/2024)  Utilities: Not At Risk (04/08/2024)  Financial Resource Strain: Low Risk  (03/07/2024)   Received from [facility]  Social Connections: Moderately Integrated (04/08/2024)  Tobacco Use: Low Risk  (04/09/2024)   SDOH Interventions:     Readmission Risk Interventions     No data to display

## 2024-04-11 ENCOUNTER — Encounter
Admission: EM | Disposition: A | Discharge status: Home Health | Payer: Self-pay | Source: Home / Self Care | Attending: Obstetrics and Gynecology

## 2024-04-11 DIAGNOSIS — G459 Transient cerebral ischemic attack, unspecified: Secondary | ICD-10-CM | POA: Diagnosis not present

## 2024-04-11 DIAGNOSIS — L02612 Cutaneous abscess of left foot: Secondary | ICD-10-CM

## 2024-04-11 LAB — CULTURE, BLOOD (ROUTINE X 2)
Special Requests: ADEQUATE
Special Requests: ADEQUATE

## 2024-04-11 LAB — MAGNESIUM: Magnesium: 1.9 mg/dL (ref 1.7–2.4)

## 2024-04-11 LAB — CBC
HCT: 38.2 % (ref 36.0–46.0)
Hemoglobin: 12.9 g/dL (ref 12.0–15.0)
MCH: 29.3 pg (ref 26.0–34.0)
MCHC: 33.8 g/dL (ref 30.0–36.0)
MCV: 86.6 fL (ref 80.0–100.0)
Platelets: 170 K/uL (ref 150–400)
RBC: 4.41 MIL/uL (ref 3.87–5.11)
RDW: 14.5 % (ref 11.5–15.5)
WBC: 8 K/uL (ref 4.0–10.5)
nRBC: 0 % (ref 0.0–0.2)

## 2024-04-11 LAB — GLUCOSE, CAPILLARY
Glucose-Capillary: 113 mg/dL — ABNORMAL HIGH (ref 70–99)
Glucose-Capillary: 127 mg/dL — ABNORMAL HIGH (ref 70–99)
Glucose-Capillary: 174 mg/dL — ABNORMAL HIGH (ref 70–99)
Glucose-Capillary: 172 mg/dL — ABNORMAL HIGH (ref 70–99)
Glucose-Capillary: 116 mg/dL — ABNORMAL HIGH (ref 70–99)

## 2024-04-11 LAB — BASIC METABOLIC PANEL WITH GFR
Anion gap: 8 (ref 5–15)
BUN: 28 mg/dL — ABNORMAL HIGH (ref 8–23)
CO2: 24 mmol/L (ref 22–32)
Calcium: 8.5 mg/dL — ABNORMAL LOW (ref 8.9–10.3)
Chloride: 106 mmol/L (ref 98–111)
Creatinine, Ser: 1.06 mg/dL — ABNORMAL HIGH (ref 0.44–1.00)
GFR, Estimated: 53 mL/min — ABNORMAL LOW (ref >60)
Glucose, Bld: 93 mg/dL (ref 70–99)
Potassium: 3 mmol/L — ABNORMAL LOW (ref 3.5–5.1)
Sodium: 138 mmol/L (ref 135–145)

## 2024-04-11 SURGERY — ECHOCARDIOGRAM, TRANSESOPHAGEAL
Anesthesia: General

## 2024-04-11 MED ORDER — OMEGA-3-ACID ETHYL ESTERS 1 G PO CAPS
1.0000 g | ORAL_CAPSULE | Freq: Every day | ORAL | Status: DC
  Administered 2024-04-11 – 2024-04-12 (×2): 1 g via ORAL
  Filled 2024-04-11 (×2): qty 1

## 2024-04-11 MED ORDER — SODIUM CHLORIDE 0.9 % IV SOLN: INTRAVENOUS | Status: DC

## 2024-04-11 MED ORDER — POTASSIUM CHLORIDE 10 MEQ/100ML IV SOLN
10.0000 meq | INTRAVENOUS | Status: DC
Start: 2024-04-11 — End: 2024-04-11
  Filled 2024-04-11: qty 100

## 2024-04-11 MED ORDER — POTASSIUM CHLORIDE 20 MEQ PO PACK
40.0000 meq | PACK | Freq: Once | ORAL | Status: AC
  Administered 2024-04-11: 40 meq via ORAL
  Filled 2024-04-11: qty 2

## 2024-04-11 NOTE — Care Management Important Message (Signed)
 Important Message  Patient Details  Name: Joann [name] MRN: [id number] Date of Birth: 1945/10/10   Important Message Given:  Yes - Medicare IM     Rosabel Sermeno W, CMA 04/11/2024, 11:46 AM

## 2024-04-11 NOTE — Progress Notes (Signed)
 Date of Admission:  04/08/2024      ID: Joann [name] is a 78 y.o. female Principal Problem:   TIA (transient ischemic attack) Active Problems:   Osteomyelitis (HCC)   Sepsis (HCC)   DM2 (diabetes mellitus, type 2) (HCC)   Biliary infection   AKI (acute kidney injury) (HCC)   Protein calorie malnutrition (HCC)   Hypokalemia   Hyponatremia   Hypomagnesemia   Malnutrition of moderate degree   Bacteremia due to methicillin susceptible Staphylococcus aureus (MSSA)    Subjective: Patient is doing okay No specific complaints Unable to eat solid food because of lack of teeth   Medications:   vitamin C   500 mg Oral BID   aspirin  EC  81 mg Oral Daily   clopidogrel   75 mg Oral Daily   enoxaparin  (LOVENOX ) injection  40 mg Subcutaneous Q24H   feeding supplement  237 mL Oral TID BM   insulin  aspart  0-5 Units Subcutaneous QHS   insulin  aspart  0-9 Units Subcutaneous TID WC   multivitamin with minerals  1 tablet Oral Daily   omega-3 acid ethyl esters  1 g Oral Daily   potassium chloride   40 mEq Oral Once   zinc  sulfate (50mg  elemental zinc )  220 mg Oral Daily    Objective: Vital signs in last 24 hours: Patient Vitals for the past 24 hrs:  BP Temp Temp src Pulse Resp SpO2  04/11/24 0851 137/62 (!) 97.5 F (36.4 C) -- (!) 56 17 96 %  04/11/24 0451 136/72 (!) 97.4 F (36.3 C) -- 67 -- 93 %  04/11/24 0039 (!) 125/56 97.8 F (36.6 C) -- (!) 55 -- 93 %  04/10/24 1937 (!) 105/53 97.9 F (36.6 C) Oral (!) 55 16 95 %  04/10/24 1558 103/60 98.4 F (36.9 C) Oral (!) 56 18 98 %  04/10/24 1150 118/61 97.8 F (36.6 C) Oral 77 17 96 %      PHYSICAL EXAM:  General: Alert, cooperative, no distress, appears stated age.  edentulous Lungs: Clear to auscultation bilaterally. No Wheezing or Rhonchi. No rales. Heart: Regular rate and rhythm, no murmur, rub or gallop. Abdomen: Soft, non-tender,not distended. Bowel sounds normal. No masses Extremities: Left dressing not  removed     Skin: No rashes or lesions. Or bruising Lymph: Cervical, supraclavicular normal. Neurologic: Grossly non-focal  Lab Results    Latest Ref Rng & Units 04/11/2024    5:44 AM 04/10/2024    5:17 AM 04/09/2024    4:26 AM  CBC  WBC 4.0 - 10.5 K/uL 8.0  6.7  8.4   Hemoglobin 12.0 - 15.0 g/dL 87.0  88.2  88.8   Hematocrit 36.0 - 46.0 % 38.2  35.3  33.5   Platelets 150 - 400 K/uL 170  153  135        Latest Ref Rng & Units 04/11/2024    5:44 AM 04/10/2024    5:17 AM 04/09/2024    4:26 AM  CMP  Glucose 70 - 99 mg/dL 93  839  871   BUN 8 - 23 mg/dL 28  30  26    Creatinine 0.44 - 1.00 mg/dL 8.93  8.86  8.67   Sodium 135 - 145 mmol/L 138  136  138   Potassium 3.5 - 5.1 mmol/L 3.0  3.8  3.8   Chloride 98 - 111 mmol/L 106  102  100   CO2 22 - 32 mmol/L 24  23  26    Calcium  8.9 -  10.3 mg/dL 8.5  8.3  8.3   Total Protein 6.5 - 8.1 g/dL  6.0  6.1   Total Bilirubin 0.0 - 1.2 mg/dL  1.0  1.6   Alkaline Phos 38 - 126 U/L  91  101   AST 15 - 41 U/L  40  51   ALT 0 - 44 U/L  32  37       Microbiology: Blood culture 04/08/2024 4 out of 4 Staph aureus 04/10/2024 blood culture has been sent 04/08/2024 left foot wound culture Proteus mirabilis  915 tissue culture from surgery pending Studies/Results: ECHOCARDIOGRAM COMPLETE Result Date: 04/10/2024    ECHOCARDIOGRAM REPORT   Patient Name:   MINEOLA DUAN Date of Exam: 04/10/2024 Medical Rec #:  [id number]             Height:       63.0 in Accession #:    [id number]            Weight:       156.5 lb Date of Birth:  Aug 15, 1945             BSA:          1.742 m Patient Age:    78 years              BP:           98/60 mmHg Patient Gender: F                     HR:           70 bpm. Exam Location:  [facility] Procedure: 2D Echo, Color Doppler, Cardiac Doppler, Strain Analysis and Saline            Contrast Bubble Study (Both Spectral and Color Flow Doppler were            utilized during procedure). Indications:     Stroke I63.9  History:          Patient has no prior history of Echocardiogram examinations.                  Risk Factors:Diabetes and Hypertension.  Sonographer:     Christopher Furnace Referring Phys:  [id number] ANDREW C CORE Diagnosing Phys: Annalee Custovic  Sonographer Comments: Global longitudinal strain was attempted. IMPRESSIONS  1. Left ventricular ejection fraction, by estimation, is 60 to 65%. The left ventricle has normal function. The left ventricle has no regional wall motion abnormalities. Left ventricular diastolic parameters are consistent with Grade I diastolic dysfunction (impaired relaxation).  2. Right ventricular systolic function is normal. The right ventricular size is normal.  3. The mitral valve is normal in structure. Mild to moderate mitral valve regurgitation. No evidence of mitral stenosis.  4. The aortic valve is normal in structure. Aortic valve regurgitation is not visualized. No aortic stenosis is present.  5. The inferior vena cava is normal in size with greater than 50% respiratory variability, suggesting right atrial pressure of 3 mmHg.  6. Agitated saline contrast bubble study was negative, with no evidence of any interatrial shunt. FINDINGS  Left Ventricle: Left ventricular ejection fraction, by estimation, is 60 to 65%. The left ventricle has normal function. The left ventricle has no regional wall motion abnormalities. The left ventricular internal cavity size was normal in size. There is  no left ventricular hypertrophy. Left ventricular diastolic parameters are consistent with Grade I diastolic dysfunction (impaired relaxation). Right Ventricle: The right ventricular size is normal.  No increase in right ventricular wall thickness. Right ventricular systolic function is normal. Left Atrium: Left atrial size was normal in size. Right Atrium: Right atrial size was normal in size. Pericardium: There is no evidence of pericardial effusion. Mitral Valve: The mitral valve is normal in structure. Mild to moderate mitral  valve regurgitation. No evidence of mitral valve stenosis. MV peak gradient, 8.5 mmHg. The mean mitral valve gradient is 2.0 mmHg. Tricuspid Valve: The tricuspid valve is normal in structure. Tricuspid valve regurgitation is mild. Aortic Valve: The aortic valve is normal in structure. Aortic valve regurgitation is not visualized. No aortic stenosis is present. Aortic valve mean gradient measures 3.0 mmHg. Aortic valve peak gradient measures 6.5 mmHg. Aortic valve area, by VTI measures 2.14 cm. Pulmonic Valve: The pulmonic valve was normal in structure. Pulmonic valve regurgitation is not visualized. Aorta: The aortic root is normal in size and structure. Venous: The inferior vena cava is normal in size with greater than 50% respiratory variability, suggesting right atrial pressure of 3 mmHg. IAS/Shunts: No atrial level shunt detected by color flow Doppler. Agitated saline contrast was given intravenously to evaluate for intracardiac shunting. Agitated saline contrast bubble study was negative, with no evidence of any interatrial shunt.  LEFT VENTRICLE PLAX 2D LVIDd:         4.50 cm   Diastology LVIDs:         3.10 cm   LV e' medial:    9.14 cm/s LV PW:         1.20 cm   LV E/e' medial:  11.4 LV IVS:        0.70 cm   LV e' lateral:   10.30 cm/s LVOT diam:     2.00 cm   LV E/e' lateral: 10.1 LV SV:         69 LV SV Index:   39 LVOT Area:     3.14 cm  RIGHT VENTRICLE RV Basal diam:  4.00 cm RV Mid diam:    3.60 cm LEFT ATRIUM             Index        RIGHT ATRIUM           Index LA diam:        3.30 cm 1.89 cm/m   RA Area:     14.90 cm LA Vol (A2C):   44.0 ml 25.25 ml/m  RA Volume:   41.80 ml  23.99 ml/m LA Vol (A4C):   60.9 ml 34.95 ml/m LA Biplane Vol: 54.8 ml 31.45 ml/m  AORTIC VALVE AV Area (Vmax):    2.33 cm AV Area (Vmean):   2.00 cm AV Area (VTI):     2.14 cm AV Vmax:           127.00 cm/s AV Vmean:          85.000 cm/s AV VTI:            0.321 m AV Peak Grad:      6.5 mmHg AV Mean Grad:      3.0 mmHg  LVOT Vmax:         94.30 cm/s LVOT Vmean:        54.000 cm/s LVOT VTI:          0.219 m LVOT/AV VTI ratio: 0.68  AORTA Ao Root diam: 2.20 cm MITRAL VALVE                TRICUSPID VALVE MV Area (PHT): 4.86  cm     TR Peak grad:   19.5 mmHg MV Area VTI:   1.85 cm     TR Vmax:        221.00 cm/s MV Peak grad:  8.5 mmHg MV Mean grad:  2.0 mmHg     SHUNTS MV Vmax:       1.46 m/s     Systemic VTI:  0.22 m MV Vmean:      61.9 cm/s    Systemic Diam: 2.00 cm MV Decel Time: 156 msec MV E velocity: 104.00 cm/s MV A velocity: 66.00 cm/s MV E/A ratio:  1.58 Designer, multimedia signed by Annalee Casa Signature Date/Time: 04/10/2024/12:34:42 PM    Final      Assessment/Plan: MSSA bacteremia secondary to left foot infection Left foot abscess with necrosis at the site of callus on the left foot S/p I/D, wound culture has been sent from the OR Repeat blood culture sent on 9/16 Pt is currently on cefazolin  and Flagyl .    2D echo has been done no obvious vegetation TEE was scheduled for today Dr.Custovic  cardiologist reviewed the 2 D echo and said it wa sa very good study and it did not show any vegetation and TEE not required-   So patient will get 4 weeks of Iv antibiotic to treat the bacteremia and the left foot infection Can get PICC tomorrow if [facility] remains negative     TIA transient rt arm weakness- resolved MRA - lt ICA occlusion at the bulb   Transminitis resolved   T2DM A1c is 5.2   CKD   Discussed the management with the patient and her husband and hospitalist and cardiologist .

## 2024-04-11 NOTE — Progress Notes (Signed)
 Heart Failure Navigator Progress Note  Assessed for Heart & Vascular TOC clinic readiness.  Patient does not meet criteria due to current [facility] patient.   Navigator will sign off at this time.  Charmaine Pines, RN, BSN [facility] Heart Failure Navigator Secure Chat Only

## 2024-04-11 NOTE — Progress Notes (Signed)
 Occupational Therapy Treatment Patient Details Name: Joann [name] MRN: [id number] DOB: 17-Dec-1945 Today's Date: 04/11/2024   History of present illness Pt is an 78 y.o. female presenting to hospital 04/08/24 with c/o nausea and progressive generalized weakness; episode of acute weakness affecting R side while pt in ED (code stroke activated; presentation most consistent with TIA per chart; symptoms resolved).  Pt with all of her teeth extracted 7 weeks ago.  Pt admitted with TIA, sepsis (secondary to cellulitis vs acute osteomyelitis L foot), osteomyelitis, biliary infection, hypomagnesemia, hyponatremia, hypokalemia, AKI, DM.  S/p I&D L foot wound 04/09/24 d/t acute abscess and diabetic foot infection.   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   OT comments  Pt making progress towards goals, completes bed mobility with supervision, dons R sock sitting EOB with supervision, MIN A to don L offloading shoe prior to performing transfers. Pt continues to benefit from max multimodal cuing for hand placement, RW proximity and poor carryover from previous session. MIN A - CGA for functional STS and pivotal steps to recliner. MD in room and aware of pt's subjective reports of spider webs intermittently in vision with head turns. Discharge recommendation remains appropriate, OT will follow.       If plan is discharge home, recommend the following:  A little help with walking and/or transfers;A little help with bathing/dressing/bathroom;Assist for transportation;Help with stairs or ramp for entrance;Supervision due to cognitive status   Equipment Recommendations  None recommended by OT       Precautions / Restrictions Precautions Precautions: Fall Recall of Precautions/Restrictions: Intact Restrictions Weight Bearing Restrictions Per Provider Order: Yes LLE Weight Bearing  Per Provider Order: Touchdown weight bearing Other Position/Activity Restrictions: Per op note POST-OPERATIVE PLAN:  Patient was instructed to be heel touch down for transfer on the operative foot in a postoperative shoe.   Activity: Avoid excessive ambulation or dependent positioning of the operative extremity. Elevate as much as possible until your post-operative appointment.       Mobility Bed Mobility Overal bed mobility: Needs Assistance Bed Mobility: Supine to Sit     Supine to sit: Supervision, HOB elevated     General bed mobility comments: mild increased effort to perform on own    Transfers Overall transfer level: Needs assistance Equipment used: Rolling walker (2 wheels) Transfers: Sit to/from Stand Sit to Stand: Min assist, Contact guard assist     Step pivot transfers: Contact guard assist, Min assist     General transfer comment: requires max multimodal cuing for hand placement, AD proximity and step pattern to rely on UE support to offset weight to LLE during transfer     Balance Overall balance assessment: Needs assistance Sitting-balance support: No upper extremity supported, Feet supported Sitting balance-Leahy Scale: Good Sitting balance - Comments: steady reaching within BOS   Standing balance support: During functional activity, Bilateral upper extremity supported, Reliant on assistive device for balance Standing balance-Leahy Scale: Fair Standing balance comment: no loss of balance ambulating with RW                           ADL either performed or assessed with clinical judgement   ADL Overall ADL's : Needs assistance/impaired                     Lower Body Dressing: Sitting/lateral leans;Set up;Minimal  assistance Lower Body Dressing Details (indicate cue type and reason): setup for donning R sock, minA to don L offloading shoe             Functional mobility during ADLs: Contact guard assist;Minimal assistance;Cueing  for safety;Cueing for sequencing;Rolling walker (2 wheels) General ADL Comments: max multimodal cuing for hand placement and sequencing for RW mgmt during functional transfers     Communication Communication Communication: Impaired Factors Affecting Communication: Hearing impaired   Cognition Arousal: Alert Behavior During Therapy: WFL for tasks assessed/performed Cognition: No family/caregiver present to determine baseline, Cognition impaired     Awareness: Intellectual awareness impaired Memory impairment (select all impairments): Short-term memory, Working memory Attention impairment (select first level of impairment): Focused attention Executive functioning impairment (select all impairments): Problem solving OT - Cognition Comments: poor problem solving                 Following commands: Impaired Following commands impaired: Follows one step commands inconsistently      Cueing   Cueing Techniques: Verbal cues, Tactile cues, Visual cues        General Comments VSS on RA. RN notified OT of pt's subjective reports of seeing spiderwebs in vision; MD in room and made aware    Pertinent Vitals/ Pain       Pain Assessment Pain Assessment: No/denies pain Pain Score: 0-No pain   Frequency  Min 2X/week        Progress Toward Goals  OT Goals(current goals can now be found in the care plan section)  Progress towards OT goals: Progressing toward goals  Acute Rehab OT Goals OT Goal Formulation: With patient Time For Goal Achievement: 04/24/24 Potential to Achieve Goals: Good ADL Goals Pt Will Perform Grooming: with modified independence;sitting;standing Pt Will Perform Lower Body Dressing: with modified independence;sit to/from stand;sitting/lateral leans Pt Will Transfer to Toilet: with modified independence;ambulating;regular height toilet;bedside commode;grab bars Pt Will Perform Toileting - Clothing Manipulation and hygiene: with modified independence;sit  to/from stand;sitting/lateral leans  Plan         AM-PAC OT 6 Clicks Daily Activity     Outcome Measure   Help from another person eating meals?: None Help from another person taking care of personal grooming?: None Help from another person toileting, which includes using toliet, bedpan, or urinal?: A Lot Help from another person bathing (including washing, rinsing, drying)?: A Lot Help from another person to put on and taking off regular upper body clothing?: A Little Help from another person to put on and taking off regular lower body clothing?: A Lot 6 Click Score: 17    End of Session Equipment Utilized During Treatment: Rolling walker (2 wheels);Gait belt;Other (comment) (L post op/offloading shoe)  OT Visit Diagnosis: Unsteadiness on feet (R26.81);Other abnormalities of gait and mobility (R26.89);Muscle weakness (generalized) (M62.81)   Activity Tolerance Patient tolerated treatment well;No increased pain   Patient Left in chair;with call bell/phone within reach;with chair alarm set;with nursing/sitter in room   Nurse Communication Mobility status;Precautions;Weight bearing status (MD in room)        Time: 9087-9062 OT Time Calculation (min): 25 min  Charges: OT General Charges $OT Visit: 1 Visit OT Treatments $Self Care/Home Management : 8-22 mins $Therapeutic Activity: 8-22 mins  Ziyan Schoon L. Darwyn Ponzo, OTR/L  04/11/24, 12:59 PM

## 2024-04-11 NOTE — Plan of Care (Signed)
  Problem: Coping: Goal: Ability to adjust to condition or change in health will improve Outcome: Progressing   Problem: Health Behavior/Discharge Planning: Goal: Ability to manage health-related needs will improve Outcome: Progressing   Problem: Metabolic: Goal: Ability to maintain appropriate glucose levels will improve Outcome: Progressing   Problem: Nutritional: Goal: Maintenance of adequate nutrition will improve Outcome: Progressing   Problem: Skin Integrity: Goal: Risk for impaired skin integrity will decrease Outcome: Progressing

## 2024-04-11 NOTE — Plan of Care (Signed)

## 2024-04-11 NOTE — Progress Notes (Signed)
 [facility]  - [place] at [facility]   PATIENT NAME: Joann [name]    MR#:  [id number]  DATE OF BIRTH:  [birth year]  SUBJECTIVE:      VITALS:  Blood pressure 137/62, pulse (!) 56, temperature (!) 97.5 F (36.4 C), resp. rate 17, height 5' 3 (1.6 m), weight 71 kg, SpO2 96%.  PHYSICAL EXAMINATION:   GENERAL:  78 y.o.-year-old patient with no acute distress.  LUNGS: Normal breath sounds bilaterally, no wheezing CARDIOVASCULAR: S1, S2 normal. No murmur   ABDOMEN: Soft, nontender, nondistended. Bowel sounds present.  EXTREMITIES: No  edema b/l.    NEUROLOGIC: nonfocal  patient is alert and awake SKIN: No obvious rash, lesion, or ulcer.   LABORATORY PANEL:  CBC Recent Labs  Lab 04/11/24 0544  WBC 8.0  HGB 12.9  HCT 38.2  PLT 170    Chemistries  Recent Labs  Lab 04/10/24 0517 04/11/24 0544 04/11/24 0848  NA 136 138  --   K 3.8 3.0*  --   CL 102 106  --   CO2 23 24  --   GLUCOSE 160* 93  --   BUN 30* 28*  --   CREATININE 1.13* 1.06*  --   CALCIUM  8.3* 8.5*  --   MG  --   --  1.9  AST 40  --   --   ALT 32  --   --   ALKPHOS 91  --   --   BILITOT 1.0  --   --    Cardiac Enzymes No results for input(s): TROPONINI in the last 168 hours. RADIOLOGY:  ECHOCARDIOGRAM COMPLETE Result Date: 04/10/2024    ECHOCARDIOGRAM REPORT   Patient Name:   Joann [name] Date of Exam: 04/10/2024 Medical Rec #:  [id number]             Height:       63.0 in Accession #:    [id number]            Weight:       156.5 lb Date of Birth:  1945-09-07             BSA:          1.742 m Patient Age:    78 years              BP:           98/60 mmHg Patient Gender: F                     HR:           70 bpm. Exam Location:  [facility] Procedure: 2D Echo, Color Doppler, Cardiac Doppler, Strain Analysis and Saline            Contrast Bubble Study (Both Spectral and Color Flow Doppler were            utilized during procedure). Indications:     Stroke I63.9  History:         Patient  has no prior history of Echocardiogram examinations.                  Risk Factors:Diabetes and Hypertension.  Sonographer:     Christopher Furnace Referring Phys:  [id number] ANDREW C CORE Diagnosing Phys: Annalee Custovic  Sonographer Comments: Global longitudinal strain was attempted. IMPRESSIONS  1. Left ventricular ejection fraction, by estimation, is 60 to 65%. The left ventricle has normal function. The left ventricle has no regional  wall motion abnormalities. Left ventricular diastolic parameters are consistent with Grade I diastolic dysfunction (impaired relaxation).  2. Right ventricular systolic function is normal. The right ventricular size is normal.  3. The mitral valve is normal in structure. Mild to moderate mitral valve regurgitation. No evidence of mitral stenosis.  4. The aortic valve is normal in structure. Aortic valve regurgitation is not visualized. No aortic stenosis is present.  5. The inferior vena cava is normal in size with greater than 50% respiratory variability, suggesting right atrial pressure of 3 mmHg.  6. Agitated saline contrast bubble study was negative, with no evidence of any interatrial shunt. FINDINGS  Left Ventricle: Left ventricular ejection fraction, by estimation, is 60 to 65%. The left ventricle has normal function. The left ventricle has no regional wall motion abnormalities. The left ventricular internal cavity size was normal in size. There is  no left ventricular hypertrophy. Left ventricular diastolic parameters are consistent with Grade I diastolic dysfunction (impaired relaxation). Right Ventricle: The right ventricular size is normal. No increase in right ventricular wall thickness. Right ventricular systolic function is normal. Left Atrium: Left atrial size was normal in size. Right Atrium: Right atrial size was normal in size. Pericardium: There is no evidence of pericardial effusion. Mitral Valve: The mitral valve is normal in structure. Mild to moderate mitral valve  regurgitation. No evidence of mitral valve stenosis. MV peak gradient, 8.5 mmHg. The mean mitral valve gradient is 2.0 mmHg. Tricuspid Valve: The tricuspid valve is normal in structure. Tricuspid valve regurgitation is mild. Aortic Valve: The aortic valve is normal in structure. Aortic valve regurgitation is not visualized. No aortic stenosis is present. Aortic valve mean gradient measures 3.0 mmHg. Aortic valve peak gradient measures 6.5 mmHg. Aortic valve area, by VTI measures 2.14 cm. Pulmonic Valve: The pulmonic valve was normal in structure. Pulmonic valve regurgitation is not visualized. Aorta: The aortic root is normal in size and structure. Venous: The inferior vena cava is normal in size with greater than 50% respiratory variability, suggesting right atrial pressure of 3 mmHg. IAS/Shunts: No atrial level shunt detected by color flow Doppler. Agitated saline contrast was given intravenously to evaluate for intracardiac shunting. Agitated saline contrast bubble study was negative, with no evidence of any interatrial shunt.  LEFT VENTRICLE PLAX 2D LVIDd:         4.50 cm   Diastology LVIDs:         3.10 cm   LV e' medial:    9.14 cm/s LV PW:         1.20 cm   LV E/e' medial:  11.4 LV IVS:        0.70 cm   LV e' lateral:   10.30 cm/s LVOT diam:     2.00 cm   LV E/e' lateral: 10.1 LV SV:         69 LV SV Index:   39 LVOT Area:     3.14 cm  RIGHT VENTRICLE RV Basal diam:  4.00 cm RV Mid diam:    3.60 cm LEFT ATRIUM             Index        RIGHT ATRIUM           Index LA diam:        3.30 cm 1.89 cm/m   RA Area:     14.90 cm LA Vol (A2C):   44.0 ml 25.25 ml/m  RA Volume:   41.80 ml  23.99 ml/m  LA Vol (A4C):   60.9 ml 34.95 ml/m LA Biplane Vol: 54.8 ml 31.45 ml/m  AORTIC VALVE AV Area (Vmax):    2.33 cm AV Area (Vmean):   2.00 cm AV Area (VTI):     2.14 cm AV Vmax:           127.00 cm/s AV Vmean:          85.000 cm/s AV VTI:            0.321 m AV Peak Grad:      6.5 mmHg AV Mean Grad:      3.0 mmHg LVOT  Vmax:         94.30 cm/s LVOT Vmean:        54.000 cm/s LVOT VTI:          0.219 m LVOT/AV VTI ratio: 0.68  AORTA Ao Root diam: 2.20 cm MITRAL VALVE                TRICUSPID VALVE MV Area (PHT): 4.86 cm     TR Peak grad:   19.5 mmHg MV Area VTI:   1.85 cm     TR Vmax:        221.00 cm/s MV Peak grad:  8.5 mmHg MV Mean grad:  2.0 mmHg     SHUNTS MV Vmax:       1.46 m/s     Systemic VTI:  0.22 m MV Vmean:      61.9 cm/s    Systemic Diam: 2.00 cm MV Decel Time: 156 msec MV E velocity: 104.00 cm/s MV A velocity: 66.00 cm/s MV E/A ratio:  1.58 Designer, multimedia signed by Annalee Casa Signature Date/Time: 04/10/2024/12:34:42 PM    Final     Assessment and Plan  Joann [name] is an 78 year old female with a past medical history of type 2 diabetes mellitus hyperlipidemia hypertension who presents to the emergency department with generalized malaise and weakness, the patient reports since having her teeth extracted 7 weeks ago she has had decreased oral intake and increasing systemic symptoms.   CT Head: 1. No acute intracranial abnormality.2. 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 Mild atrophy and white matter changes bilaterally. 4. Atherosclerotic calcifications in the cavernous carotid arteries bilaterally.    Acute left foot abscess s/p I and D by Dr Tanda Podiatry MSSA Bacterimia/sepsis--improving --cont IV cefazolin  and Flagyl  -- ID consultation appreciated. -- TEE was requested however [facility] cardiologist Dr. Casa feels patient's TTE is very minimal valvular disease and no indication for TEE at present. -- Will await antibiotic decision per ID -- patient afebrile -- wound culture growing E. coli and Proteus -- blood culture 4/4 staph aureus  TIA -risk factor hypertension and diabetes -- seen by neurology Dr. Germaine-- recommends aspirin  and Plavix  at current dose discontinue aspirin  after 21 days and continue Plavix  75  mg daily -- patient has allergy to statin -- change to fish oil/Omega fatty acid --PT/OT eval noted. Will arrange home health  Incidental finding of Meningioma on MRI Brain  Type 2 DM, no complication --ssi  CKD 3 a --creat stable  HTN --pt take hydrochlorothiazide  at home --holding due to pt's BP stable    Procedures: incision and drainage of left foot abscess Family communication :none today Consults : ID, cardiology, podiatry CODE STATUS: full DVT Prophylaxis : Lovenox  Level of care: Med-Surg Status is: Inpatient Remains inpatient appropriate because: pt overall improving. Will get picc placed tomorrow if  repeat BC neg. TOC for d/c planning    TOTAL TIME TAKING CARE OF THIS PATIENT: 35 minutes.  >50% time spent on counselling and coordination of care  Note: This dictation was prepared with Dragon dictation along with smaller phrase technology. Any transcriptional errors that result from this process are unintentional.  Leita Blanch M.D    [facility]   CC: Primary care physician; Rudolpho Norleen BIRCH, MD

## 2024-04-11 NOTE — Consult Note (Addendum)
   Transesophageal Echocardiogram Consent Note  [facility] Cardiology has been requested to perform a transesophageal echocardiogram on Joann [name] for MSSA Bacteremia.  After careful review of history and examination, the risks and benefits of transesophageal echocardiogram have been explained including risks of esophageal damage, perforation (1:10,000 risk), bleeding, pharyngeal hematoma as well as other potential complications associated with conscious sedation including aspiration, arrhythmia, respiratory failure and death. Alternatives to treatment were discussed, questions were answered. Patient is willing to proceed. TEE is scheduled for 09/17 at 1PM with Dr. Jarry Manon.  SignedBETHA Dorene Comfort, PA-C  04/11/2024 8:19 AM    Patient clinically stable without signs/symptoms of endocarditis. Surface echo shows minimal valvular disease without any suspicion for endocarditis. Will cancel TEE unless clinical course changes.  Brannan Cassedy, DO 10:53 AM 04/11/24

## 2024-04-11 NOTE — Progress Notes (Signed)
 Changed patient's left foot dressing reapplying as ordered with iodoform strip. Approximately 15 minutes later patient stating she was seeing spoke and her foot was burning.  She had this burning at the beginning of my shift with the same feeling of being on fire. She removed dressing. Spoke to pharmacist about possibility of having an allergic reaction to iodoform and stated was a possibility. Redressed foot wound with NS soaked gauze and repacked.  Will pass on information to mext shift nurse to speak to MD about the situation.

## 2024-04-11 NOTE — Progress Notes (Signed)
 Dr Ashley made aware of night shift RN note regarding dressing change, acknowledged

## 2024-04-12 ENCOUNTER — Other Ambulatory Visit: Payer: Self-pay

## 2024-04-12 DIAGNOSIS — L089 Local infection of the skin and subcutaneous tissue, unspecified: Secondary | ICD-10-CM

## 2024-04-12 DIAGNOSIS — B964 Proteus (mirabilis) (morganii) as the cause of diseases classified elsewhere: Secondary | ICD-10-CM

## 2024-04-12 LAB — BASIC METABOLIC PANEL WITH GFR
Anion gap: 10 (ref 5–15)
BUN: 22 mg/dL (ref 8–23)
CO2: 21 mmol/L — ABNORMAL LOW (ref 22–32)
Calcium: 8.4 mg/dL — ABNORMAL LOW (ref 8.9–10.3)
Chloride: 105 mmol/L (ref 98–111)
Creatinine, Ser: 0.89 mg/dL (ref 0.44–1.00)
GFR, Estimated: >60 mL/min (ref >60)
Glucose, Bld: 135 mg/dL — ABNORMAL HIGH (ref 70–99)
Potassium: 4 mmol/L (ref 3.5–5.1)
Sodium: 136 mmol/L (ref 135–145)

## 2024-04-12 LAB — GLUCOSE, CAPILLARY
Glucose-Capillary: 115 mg/dL — ABNORMAL HIGH (ref 70–99)
Glucose-Capillary: 143 mg/dL — ABNORMAL HIGH (ref 70–99)

## 2024-04-12 MED ORDER — CHLORHEXIDINE GLUCONATE CLOTH 2 % EX PADS
6.0000 | MEDICATED_PAD | Freq: Every day | CUTANEOUS | Status: DC
  Administered 2024-04-12: 6 via TOPICAL

## 2024-04-12 MED ORDER — ENSURE PLUS HIGH PROTEIN PO LIQD: 237.0000 mL | Freq: Three times a day (TID) | ORAL | 0 refills | Status: AC

## 2024-04-12 MED ORDER — ZINC SULFATE 220 (50 ZN) MG PO CAPS: 220.0000 mg | ORAL_CAPSULE | Freq: Every day | ORAL | 0 refills | Status: AC

## 2024-04-12 MED ORDER — ASCORBIC ACID 500 MG PO TABS: 500.0000 mg | ORAL_TABLET | Freq: Two times a day (BID) | ORAL | 0 refills | Status: AC

## 2024-04-12 MED ORDER — CEFAZOLIN IV (FOR PTA / DISCHARGE USE ONLY): 2.0000 g | Freq: Three times a day (TID) | INTRAVENOUS | 0 refills | Status: DC

## 2024-04-12 MED ORDER — CLOPIDOGREL BISULFATE 75 MG PO TABS: 75.0000 mg | ORAL_TABLET | Freq: Every day | ORAL | 2 refills | Status: AC

## 2024-04-12 MED ORDER — ADULT MULTIVITAMIN W/MINERALS CH: 1.0000 | ORAL_TABLET | Freq: Every day | ORAL | 1 refills | Status: AC

## 2024-04-12 MED ORDER — SODIUM CHLORIDE 0.9% FLUSH
10.0000 mL | Freq: Two times a day (BID) | INTRAVENOUS | Status: DC
  Administered 2024-04-12: 10 mL

## 2024-04-12 MED ORDER — OMEGA-3-ACID ETHYL ESTERS 1 G PO CAPS: 1.0000 g | ORAL_CAPSULE | Freq: Every day | ORAL | 0 refills | Status: AC

## 2024-04-12 MED ORDER — SODIUM CHLORIDE 0.9% FLUSH: 10.0000 mL | INTRAVENOUS | Status: DC | PRN

## 2024-04-12 MED ORDER — ASPIRIN EC 81 MG PO TBEC: 81.0000 mg | DELAYED_RELEASE_TABLET | Freq: Every day | ORAL | 0 refills | Status: DC | PRN

## 2024-04-12 NOTE — Discharge Instructions (Signed)
 Use your post-op shoe as instructed

## 2024-04-12 NOTE — Progress Notes (Signed)
 Peripherally Inserted Central Catheter Placement  The IV Nurse has discussed with the patient and/or persons authorized to consent for the patient, the purpose of this procedure and the potential benefits and risks involved with this procedure.  The benefits include less needle sticks, lab draws from the catheter, and the patient may be discharged home with the catheter. Risks include, but not limited to, infection, bleeding, blood clot (thrombus formation), and puncture of an artery; nerve damage and irregular heartbeat and possibility to perform a PICC exchange if needed/ordered by physician.  Alternatives to this procedure were also discussed.  Bard Power PICC patient education guide, fact sheet on infection prevention and patient information card has been provided to patient /or left at bedside.    PICC Placement Documentation  PICC Single Lumen 04/12/24 Right Brachial 35 cm 0 cm (Active)  Indication for Insertion or Continuance of Line Prolonged intravenous therapies 04/12/24 1201  Exposed Catheter (cm) 0 cm 04/12/24 1201  Site Assessment Clean, Dry, Intact 04/12/24 1201  Line Status Flushed;Saline locked;Blood return noted 04/12/24 1201  Dressing Type Transparent;Securing device 04/12/24 1201  Dressing Status Antimicrobial disc/dressing in place;Clean, Dry, Intact 04/12/24 1201  Line Care Connections checked and tightened 04/12/24 1201  Line Adjustment (NICU/IV Team Only) No 04/12/24 1201  Dressing Intervention New dressing;Adhesive placed at insertion site (IV team only) 04/12/24 1201  Dressing Change Due 04/19/24 04/12/24 1201       Joann [name] 04/12/2024, 12:23 PM

## 2024-04-12 NOTE — Treatment Plan (Signed)
 Diagnosis: MSSA bacteremia and left foot infection Baseline Creatinine <1   Allergies  Allergen Reactions   Neosporin [Neomycin-Bacitracin Zn-Polymyx] Itching   Penicillins Itching    Has patient had a PCN reaction causing immediate rash, facial/tongue/throat swelling, SOB or lightheadedness with hypotension: no Has patient had a PCN reaction causing severe rash involving mucus membranes or skin necrosis: no Has patient had a PCN reaction that required hospitalization no Has patient had a PCN reaction occurring within the last 10 years:no If all of the above answers are NO, then may proceed with Cephalosporin use.    Statins     hallucinations    OPAT Orders Discharge antibiotics: Cefazolin  2 grams Iv every 8 hours Duration: 4 weeks End Date:05/07/24   PIC Care Per Protocol:  Labs weekly while on IV antibiotics: _X_ CBC with differential  _X_ CMP _x_ CRP _X_ ESR   _X_ Please pull PIC at completion of IV antibiotics   Fax weekly lab results  promptly to [phone]  Clinic Follow Up Appt:05/03/24 at 10.30 AM   Call [phone] with any questions or critical value

## 2024-04-12 NOTE — Progress Notes (Signed)
 PHARMACY CONSULT NOTE FOR:  OUTPATIENT  PARENTERAL ANTIBIOTIC THERAPY (OPAT)  Indication: MSSA bacteremia and foot infection Regimen: Cefazolin  2gm IV q8h End date: 05/07/2024  Labs - Once weekly:  CBC/D, CMP, ESR and CRP Fax weekly lab results  promptly to [phone] Please pull PIC at completion of IV antibiotics Call [phone] with any questions or critical value  IV antibiotic discharge orders are pended. To discharging provider:  please sign these orders via discharge navigator,  Select New Orders & click on the button choice - Manage This Unsigned Work.     Thank you for allowing pharmacy to be a part of this patient's care.  Leavy Heatherly, PharmD, BCPS, BCIDP Work Cell: [phone] 04/12/2024 8:57 AM

## 2024-04-12 NOTE — TOC Progression Note (Signed)
 Transition of Care [facility]) - Progression Note    Patient Details  Name: Joann [name] MRN: [id number] Date of Birth: August 06, 1945  Transition of [facility] CM/SW Contact  Dalia GORMAN Fuse, RN Phone Number: 04/12/2024, 1:44 PM  Clinical Narrative:     9:00 AM TOC sent referral for Home IV ABX to Holley Herring with [facility]. Pam accepted the referral. Patient's payor is [facility] Medicare. [facility] is the only agency who accepts [facility]. TOC sent referral for RN and PT to [facility]. Shaun accepted the referral.                     Expected Discharge Plan and Services         Expected Discharge Date: 04/12/24                                     Social Drivers of Health (SDOH) Interventions SDOH Screenings   Food Insecurity: No Food Insecurity (04/08/2024)  Housing: Low Risk  (04/08/2024)  Transportation Needs: No Transportation Needs (04/08/2024)  Utilities: Not At Risk (04/08/2024)  Financial Resource Strain: Low Risk  (03/07/2024)   Received from [facility]  Social Connections: Moderately Integrated (04/08/2024)  Tobacco Use: Low Risk  (04/09/2024)    Readmission Risk Interventions     No data to display

## 2024-04-12 NOTE — Progress Notes (Signed)
 Date of Admission:  04/08/2024      ID: Joann [name] is a 78 y.o. female Principal Problem:   TIA (transient ischemic attack) Active Problems:   Osteomyelitis (HCC)   Sepsis (HCC)   DM2 (diabetes mellitus, type 2) (HCC)   Biliary infection   AKI (acute kidney injury) (HCC)   Protein calorie malnutrition (HCC)   Hypokalemia   Hyponatremia   Hypomagnesemia   Malnutrition of moderate degree   Bacteremia due to methicillin susceptible Staphylococcus aureus (MSSA)   Foot abscess, left    Subjective: Patient is doing okay No specific complaints  still not able to eat solid food because of absent teeth  Medications:   vitamin C   500 mg Oral BID   aspirin  EC  81 mg Oral Daily   clopidogrel   75 mg Oral Daily   enoxaparin  (LOVENOX ) injection  40 mg Subcutaneous Q24H   feeding supplement  237 mL Oral TID BM   insulin  aspart  0-5 Units Subcutaneous QHS   insulin  aspart  0-9 Units Subcutaneous TID WC   multivitamin with minerals  1 tablet Oral Daily   omega-3 acid ethyl esters  1 g Oral Daily   zinc  sulfate (50mg  elemental zinc )  220 mg Oral Daily    Objective: Vital signs in last 24 hours: Patient Vitals for the past 24 hrs:  BP Temp Temp src Pulse Resp SpO2  04/12/24 0820 126/63 98.2 F (36.8 C) Oral 69 16 95 %  04/12/24 0433 (!) 150/86 -- -- (!) 108 -- --  04/12/24 0429 (!) 152/103 97.8 F (36.6 C) -- (!) 110 -- 98 %  04/11/24 2351 (!) 161/83 98.4 F (36.9 C) -- 91 18 93 %  04/11/24 1945 134/72 98.4 F (36.9 C) -- 78 18 94 %  04/11/24 1642 (!) 141/62 97.9 F (36.6 C) Oral 80 18 97 %  04/11/24 1251 (!) 112/59 97.7 F (36.5 C) -- 77 15 95 %      PHYSICAL EXAM:  General: Alert, cooperative, no distress, appears stated age.  edentulous Lungs: Clear to auscultation bilaterally. No Wheezing or Rhonchi. No rales. Heart: Regular rate and rhythm, no murmur, rub or gallop. Abdomen: Soft, non-tender,not distended. Bowel sounds normal. No masses Extremities:  Left leg dressing changed     Prior to surgery    Skin: No rashes or lesions. Or bruising Lymph: Cervical, supraclavicular normal. Neurologic: Grossly non-focal  Lab Results    Latest Ref Rng & Units 04/11/2024    5:44 AM 04/10/2024    5:17 AM 04/09/2024    4:26 AM  CBC  WBC 4.0 - 10.5 K/uL 8.0  6.7  8.4   Hemoglobin 12.0 - 15.0 g/dL 87.0  88.2  88.8   Hematocrit 36.0 - 46.0 % 38.2  35.3  33.5   Platelets 150 - 400 K/uL 170  153  135        Latest Ref Rng & Units 04/12/2024    5:42 AM 04/11/2024    5:44 AM 04/10/2024    5:17 AM  CMP  Glucose 70 - 99 mg/dL 864  93  839   BUN 8 - 23 mg/dL 22  28  30    Creatinine 0.44 - 1.00 mg/dL 9.10  8.93  8.86   Sodium 135 - 145 mmol/L 136  138  136   Potassium 3.5 - 5.1 mmol/L 4.0  3.0  3.8   Chloride 98 - 111 mmol/L 105  106  102   CO2 22 - 32  mmol/L 21  24  23    Calcium  8.9 - 10.3 mg/dL 8.4  8.5  8.3   Total Protein 6.5 - 8.1 g/dL   6.0   Total Bilirubin 0.0 - 1.2 mg/dL   1.0   Alkaline Phos 38 - 126 U/L   91   AST 15 - 41 U/L   40   ALT 0 - 44 U/L   32       Microbiology: Blood culture 04/08/2024 4 out of 4 Staph aureus 04/10/2024 blood culture has been sent 04/08/2024 left foot wound culture Proteus mirabilis  915 tissue culture from surgery pending Studies/Results: US  EKG SITE RITE Result Date: 04/12/2024 If Site Rite image not attached, placement could not be confirmed due to current cardiac rhythm.  US  EKG SITE RITE Result Date: 04/12/2024 If Site Rite image not attached, placement could not be confirmed due to current cardiac rhythm.    Assessment/Plan: MSSA bacteremia secondary to left foot infection Left foot abscess with necrosis at the site of callus on the left foot S/p I/D, to the level of the bone.  Wound culture has been sent from the OR.Surgical culture has Staph aureus and rare Proteus mirabilis.  The Proteus is intermediate to cefazolin  at 4. Repeat blood culture sent on 9/16 is negative. Pt is currently  on cefazolin  and Flagyl .  Will continue the cefazolin  to treat the Staph aureus.  This will also treat the Proteus.  But we will keep a close eye on the foot as outpatient to see whether she may need addition of any other antibiotic like Bactrim.  2D echo has been done no obvious vegetation Dr.Custovic  cardiologist reviewed the 2 D echo and said it wa sa very good study and it did not show any vegetation and TEE not required-   So patient will get 4 weeks of Iv antibiotic to treat the bacteremia and the left foot infection Getting PICC today.  OPAT orders entered.  TIA transient rt arm weakness- resolved MRA - lt ICA occlusion at the bulb   Transminitis resolved   T2DM A1c is 5.2   CKD   Discussed the management with the patient and her husband and hospitalist in detail. Will follow her as outpatient in my clinic. [name]

## 2024-04-12 NOTE — Plan of Care (Signed)

## 2024-04-12 NOTE — Discharge Summary (Signed)
 Physician Discharge Summary   Patient: Joann [name] MRN: [id number] DOB: [birth year]  Admit date:     04/08/2024  Discharge date: 04/12/24  Discharge Physician: Leita Blanch   PCP: Rudolpho Norleen BIRCH, MD   Recommendations at discharge:    F/u Dr Greig blush [facility] podiatry in 1 week F/u Dr Fayette ID I on 05/03/24 at 10:30 am F/u PCP in 1-2 weeks  Discharge Diagnoses: Principal Problem:   TIA (transient ischemic attack) Active Problems:   Sepsis (HCC)   Osteomyelitis (HCC)   Biliary infection   DM2 (diabetes mellitus, type 2) (HCC)   AKI (acute kidney injury) (HCC)   Protein calorie malnutrition (HCC)   Hypokalemia   Hyponatremia   Hypomagnesemia   Malnutrition of moderate degree   Bacteremia due to methicillin susceptible Staphylococcus aureus (MSSA)   Foot abscess, left Joann [name] is an 78 year old female with a past medical history of type 2 diabetes mellitus hyperlipidemia hypertension who presents to the emergency department with generalized malaise and weakness, the patient reports since having her teeth extracted 7 weeks ago she has had decreased oral intake and increasing systemic symptoms.    CT Head: 1. No acute intracranial abnormality.2. 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 Mild atrophy and white matter changes bilaterally. 4. Atherosclerotic calcifications in the cavernous carotid arteries bilaterally.     Acute left foot abscess s/p I and D by Dr blush Podiatry MSSA Bacterimia/sepsis--improving --cont IV cefazolin  and Flagyl  -- ID consultation appreciated. -- TEE was requested however [facility] cardiologist Dr. Dewane feels patient's TTE is very minimal valvular disease and no indication for TEE at present. --f/u Dr blush as out pt -- patient afebrile -- wound culture growing E. coli and Proteus -- blood culture 4/4 staph aureus -- repeat blood culture negative. Okay to place PICC line per ID. --  OPAT Orders Discharge antibiotics: Cefazolin  2 grams Iv every 8 hours  Duration: 4 weeks End Date:05/07/24     [facility] Care Per Protocol:   Labs weekly while on IV antibiotics: _X_ CBC with differential   _X_ CMP _x_ CRP _X_ ESR  _X_ Please pull PIC at completion of IV antibiotics    Fax weekly lab results  promptly to [phone]   Clinic Follow Up Appt:05/03/24 at 10.30 AM  Call [phone] with any questions or critical value -- discussed with patient and her husband at bedside he is agreeable with IV antibiotics at home.   TIA -risk factor hypertension and diabetes -- seen by neurology Dr. Germaine-- recommends aspirin  and Plavix  at current dose discontinue aspirin  after 21 days and continue Plavix  75 mg daily -- patient has allergy to statin -- change to fish oil/Omega fatty acid --PT/OT eval noted. Will arrange home health   Incidental finding of Meningioma on MRI Brain   Type 2 DM, no complication --ssi --resume home meds   CKD 3 a --creat stable   HTN --pt take hydrochlorothiazide  at home --bp stable  Discharge plan discussed with patient and husband. Home health arranged. Wound instructions as per Dr. Luigi orders will be continued at home. Patient will follow-up with podiatry, PCP and ID as outpatient       Procedures: incision and drainage of left foot abscess Family communication :none today Consults : ID, cardiology, podiatry CODE STATUS: full DVT Prophylaxis : Lovenox      Disposition: Home health Diet recommendation:  Cardiac and Carb modified diet DISCHARGE MEDICATION: Allergies as of 04/12/2024  Reactions   Neosporin [neomycin-bacitracin Zn-polymyx] Itching   Penicillins Itching   Has patient had a PCN reaction causing immediate rash, facial/tongue/throat swelling, SOB or lightheadedness with hypotension: no Has patient had a PCN reaction causing severe rash involving mucus membranes or skin necrosis: no Has patient had a PCN  reaction that required hospitalization no Has patient had a PCN reaction occurring within the last 10 years:no If all of the above answers are NO, then may proceed with Cephalosporin use.   Statins    hallucinations        Medication List     TAKE these medications    ascorbic acid  500 MG tablet Commonly known as: VITAMIN C  Take 1 tablet (500 mg total) by mouth 2 (two) times daily.   aspirin  EC 81 MG tablet Take 1 tablet (81 mg total) by mouth daily as needed for up to 21 days (headaches).   CALCIUM  + D PO Take 1 tablet by mouth daily.   ceFAZolin  IVPB Commonly known as: ANCEF  Inject 2 g into the vein every 8 (eight) hours for 25 days. Indication:  MSSA bacteremia and foot infection First Dose: Yes Last Day of Therapy:  05/07/2024 Labs - Once weekly:  CBC/D, CMP, ESR and CRP Fax weekly lab results  promptly to [phone] Method of administration: IV Push Method of administration may be changed at the discretion of home infusion pharmacist based upon assessment of the patient and/or caregiver's ability to self-administer the medication ordered. Please pull PIC at completion of IV antibiotics Call [phone] with any questions or critical value   clopidogrel  75 MG tablet Commonly known as: PLAVIX  Take 1 tablet (75 mg total) by mouth daily. Start taking on: April 13, 2024   feeding supplement Liqd Take 237 mLs by mouth 3 (three) times daily between meals.   hydrochlorothiazide  12.5 MG tablet Commonly known as: HYDRODIURIL  Take 12.5 mg by mouth daily.   metFORMIN  500 MG tablet Commonly known as: GLUCOPHAGE  Take 500 mg by mouth 2 (two) times daily.   multivitamin with minerals Tabs tablet Take 1 tablet by mouth daily. Start taking on: April 13, 2024   omega-3 acid ethyl esters 1 g capsule Commonly known as: LOVAZA  Take 1 capsule (1 g total) by mouth daily. Start taking on: April 13, 2024   zinc  sulfate (50mg  elemental zinc ) 220 (50 Zn) MG  capsule Take 1 capsule (220 mg total) by mouth daily. Start taking on: April 13, 2024               Discharge Care Instructions  (From admission, onward)           Start     Ordered   04/12/24 0000  Change dressing on IV access line weekly and PRN  (Home infusion instructions - [facility] )        04/12/24 1156            Follow-up Information     Rudolpho Norleen BIRCH, MD. Schedule an appointment as soon as possible for a visit in 1 week(s).   Specialty: Internal Medicine Contact information: [address] [facility] [place] [address] [phone]         Tanda Greig MATSU, DPM. Schedule an appointment as soon as possible for a visit in 1 week(s).   Specialty: Podiatry Why: post-op f/u left foot abscess Contact information: [address] [place] [address] [phone]         Fayette Bodily, MD. Go on 05/03/2024.  Specialty: Infectious Diseases Why: at 10:30 am Contact information: [address] [place] [address] [phone]                Discharge Exam: Fredricka Weights   04/08/24 1410 04/08/24 2204 04/09/24 1219  Weight: 65.3 kg 71 kg 71 kg  From today 04/12/24      Condition at discharge: fair  The results of significant diagnostics from this hospitalization (including imaging, microbiology, ancillary and laboratory) are listed below for reference.   Imaging Studies: US  EKG SITE RITE Result Date: 04/12/2024 If Site Rite image not attached, placement could not be confirmed due to current cardiac rhythm.  US  EKG SITE RITE Result Date: 04/12/2024 If Site Rite image not attached, placement could not be confirmed due to current cardiac rhythm.  ECHOCARDIOGRAM COMPLETE Result Date: 04/10/2024    ECHOCARDIOGRAM REPORT   Patient Name:   Joann [name] Date of Exam: 04/10/2024 Medical Rec #:  [id number]             Height:       63.0 in Accession #:    [id number]             Weight:       156.5 lb Date of Birth:  [birth year]             BSA:          1.742 m Patient Age:    78 years              BP:           98/60 mmHg Patient Gender: F                     HR:           70 bpm. Exam Location:  [facility] Procedure: 2D Echo, Color Doppler, Cardiac Doppler, Strain Analysis and Saline            Contrast Bubble Study (Both Spectral and Color Flow Doppler were            utilized during procedure). Indications:     Stroke I63.9  History:         Patient has no prior history of Echocardiogram examinations.                  Risk Factors:Diabetes and Hypertension.  Sonographer:     Christopher Furnace Referring Phys:  [id number] ANDREW C CORE Diagnosing Phys: Annalee Custovic  Sonographer Comments: Global longitudinal strain was attempted. IMPRESSIONS  1. Left ventricular ejection fraction, by estimation, is 60 to 65%. The left ventricle has normal function. The left ventricle has no regional wall motion abnormalities. Left ventricular diastolic parameters are consistent with Grade I diastolic dysfunction (impaired relaxation).  2. Right ventricular systolic function is normal. The right ventricular size is normal.  3. The mitral valve is normal in structure. Mild to moderate mitral valve regurgitation. No evidence of mitral stenosis.  4. The aortic valve is normal in structure. Aortic valve regurgitation is not visualized. No aortic stenosis is present.  5. The inferior vena cava is normal in size with greater than 50% respiratory variability, suggesting right atrial pressure of 3 mmHg.  6. Agitated saline contrast bubble study was negative, with no evidence of any interatrial shunt. FINDINGS  Left Ventricle: Left ventricular ejection fraction, by estimation, is 60 to 65%. The left ventricle has normal function. The left ventricle has no regional wall motion abnormalities. The left ventricular  internal cavity size was normal in size. There is  no left ventricular hypertrophy. Left ventricular diastolic  parameters are consistent with Grade I diastolic dysfunction (impaired relaxation). Right Ventricle: The right ventricular size is normal. No increase in right ventricular wall thickness. Right ventricular systolic function is normal. Left Atrium: Left atrial size was normal in size. Right Atrium: Right atrial size was normal in size. Pericardium: There is no evidence of pericardial effusion. Mitral Valve: The mitral valve is normal in structure. Mild to moderate mitral valve regurgitation. No evidence of mitral valve stenosis. MV peak gradient, 8.5 mmHg. The mean mitral valve gradient is 2.0 mmHg. Tricuspid Valve: The tricuspid valve is normal in structure. Tricuspid valve regurgitation is mild. Aortic Valve: The aortic valve is normal in structure. Aortic valve regurgitation is not visualized. No aortic stenosis is present. Aortic valve mean gradient measures 3.0 mmHg. Aortic valve peak gradient measures 6.5 mmHg. Aortic valve area, by VTI measures 2.14 cm. Pulmonic Valve: The pulmonic valve was normal in structure. Pulmonic valve regurgitation is not visualized. Aorta: The aortic root is normal in size and structure. Venous: The inferior vena cava is normal in size with greater than 50% respiratory variability, suggesting right atrial pressure of 3 mmHg. IAS/Shunts: No atrial level shunt detected by color flow Doppler. Agitated saline contrast was given intravenously to evaluate for intracardiac shunting. Agitated saline contrast bubble study was negative, with no evidence of any interatrial shunt.  LEFT VENTRICLE PLAX 2D LVIDd:         4.50 cm   Diastology LVIDs:         3.10 cm   LV e' medial:    9.14 cm/s LV PW:         1.20 cm   LV E/e' medial:  11.4 LV IVS:        0.70 cm   LV e' lateral:   10.30 cm/s LVOT diam:     2.00 cm   LV E/e' lateral: 10.1 LV SV:         69 LV SV Index:   39 LVOT Area:     3.14 cm  RIGHT VENTRICLE RV Basal diam:  4.00 cm RV Mid diam:    3.60 cm LEFT ATRIUM             Index         RIGHT ATRIUM           Index LA diam:        3.30 cm 1.89 cm/m   RA Area:     14.90 cm LA Vol (A2C):   44.0 ml 25.25 ml/m  RA Volume:   41.80 ml  23.99 ml/m LA Vol (A4C):   60.9 ml 34.95 ml/m LA Biplane Vol: 54.8 ml 31.45 ml/m  AORTIC VALVE AV Area (Vmax):    2.33 cm AV Area (Vmean):   2.00 cm AV Area (VTI):     2.14 cm AV Vmax:           127.00 cm/s AV Vmean:          85.000 cm/s AV VTI:            0.321 m AV Peak Grad:      6.5 mmHg AV Mean Grad:      3.0 mmHg LVOT Vmax:         94.30 cm/s LVOT Vmean:        54.000 cm/s LVOT VTI:          0.219  m LVOT/AV VTI ratio: 0.68  AORTA Ao Root diam: 2.20 cm MITRAL VALVE                TRICUSPID VALVE MV Area (PHT): 4.86 cm     TR Peak grad:   19.5 mmHg MV Area VTI:   1.85 cm     TR Vmax:        221.00 cm/s MV Peak grad:  8.5 mmHg MV Mean grad:  2.0 mmHg     SHUNTS MV Vmax:       1.46 m/s     Systemic VTI:  0.22 m MV Vmean:      61.9 cm/s    Systemic Diam: 2.00 cm MV Decel Time: 156 msec MV E velocity: 104.00 cm/s MV A velocity: 66.00 cm/s MV E/A ratio:  1.58 Designer, multimedia signed by Annalee Casa Signature Date/Time: 04/10/2024/12:34:42 PM    Final    MR FOOT LEFT WO CONTRAST Result Date: 04/09/2024 CLINICAL DATA:  Osteomyelitis, foot Plantar foot wound.  History of partial metatarsal amputations. EXAM: MRI OF THE LEFT FOOT WITHOUT CONTRAST TECHNIQUE: Multiplanar, multisequence MR imaging of the left forefoot was performed. No intravenous contrast was administered. COMPARISON:  Radiographs 04/08/2024 and 10/15/2016. MRI of the left forefoot 02/21/2019 and 12/11/2019. FINDINGS: Bones/Joint/Cartilage Postsurgical changes from amputation of the 2nd metatarsal at the metatarsal neck are unchanged from the previous MRI. Interval resection of the distal 5th metatarsal and base of the 5th proximal phalanx. No highly suspicious marrow signal abnormalities within the remaining 5th ray to suggest recurrent osteomyelitis. There is T2 hyperintensity  within the proximal 2nd metatarsal without cortical destruction, possibly stress related. Nearby subcortical cyst formation in the 2nd and 3rd metatarsal bases and adjacent cuneiform bones. No evidence of acute fracture or dislocation. No significant joint effusions are identified. There are moderate midfoot degenerative changes and stable mild chronic flattening of the 3rd metatarsal head. Ligaments Intact Lisfranc ligament. The collateral ligaments of the remaining metatarsophalangeal joints appear intact. Muscles and Tendons Chronic fatty forefoot muscular atrophy, similar to previous study. No focal intramuscular fluid collection or acute tendon rupture identified. The abductor digiti minimi tendon is discontinuous at the level of the distal 5th metatarsal, likely related to previous amputation. Soft tissues Soft tissue wound along the plantar lateral aspect of the forefoot at the level of the 5th metatarsal amputation. There is an adjacent focal fluid collection along the discontinuous abductor digiti minimi tendon which measures approximately 1.7 x 0.9 x 1.5 cm. There are surrounding soft tissue inflammatory changes without other organized fluid collections. IMPRESSION: 1. Soft tissue wound along the plantar lateral aspect of the forefoot at the level of the 5th metatarsal amputation with adjacent focal fluid collection suspicious for a small abscess. 2. No specific evidence of recurrent osteomyelitis at this time. Postsurgical changes in the 5th ray as described. 3. T2 hyperintensity within the proximal 2nd metatarsal without cortical destruction, possibly stress related. Stable postsurgical changes from amputation of the 2nd metatarsal at the metatarsal neck. 4. Chronic fatty forefoot muscular atrophy. Electronically Signed   By: Elsie Perone M.D.   On: 04/09/2024 09:57   MR BRAIN WO CONTRAST Addendum Date: 04/08/2024 ADDENDUM REPORT: 04/08/2024 23:00 ADDENDUM: Please note that this examination was  inadvertently signed off prior to completing the initial dictation. The following addendum includes findings for the MRA portions of the head and neck as well as the overall impression for these studies. MRA HEAD FINDINGS: ANTERIOR CIRCULATION: Right ICA widely patent the siphon  without stenosis. Irregular attenuated intermittent flow seen within the visualized distal cervical left ICA, which largely occludes at the skull base. Attenuated distal reconstitution at the left carotid siphon the of the left ophthalmic artery. Right A1 segment patent. Left A1 attenuated but patent. Normal anterior communicating complex. Both ACAs patent without visible stenosis. Right M1 segment widely patent. Atheromatous irregularity about proximal M2 branch with mild stenoses, inferior division (series 1026, image 6). Right MCA branches otherwise widely patent. Attenuated but patent flow within the left M1 segment and distal left MCA branches. POSTERIOR CIRCULATION: Visualized vertebral arteries patent without stenosis. Left vertebral artery dominant. Both PICA patent at their origins. Basilar patent without stenosis. Superior cerebellar arteries patent bilaterally. Both PCAs primarily supplied via the basilar. Mild atheromatous irregularity about the PCAs with associated mild bilateral P2 stenoses. PCAs remain patent to their distal aspects. No intracranial aneurysm. MRA NECK FINDINGS: AORTIC ARCH: Examination mildly degraded by motion and lack of IV contrast. Visualized aortic arch within normal limits for caliber standard branch pattern. No visible stenosis about the origin of the great vessels. RIGHT CAROTID SYSTEM: Right common and internal carotid arteries are patent with antegrade flow. No visible dissection. Atheromatous change about the right carotid bulb/proximal right ICA with associated stenosis of up to 60 percent by NASCET criteria. LEFT CAROTID SYSTEM: Left CCA grossly patent at its origin, and remains grossly patent to  the bifurcation. Left ICA appears occluded at its origin at the bifurcation. Persisted perfusion within the left ECA and its major branches. VERTEBRAL ARTERIES: Both vertebral arteries arise from subclavian arteries. Left vertebral artery dominant. Vertebral arteries are patent with antegrade flow. No visible to stenosis or dissection. Other: None. IMPRESSION: MRI HEAD IMPRESSION: 1. No acute intracranial abnormality. 2. 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 3. Few small meningiomas measuring up to 1.1 cm along the falx and overlying the left frontal convexity as above. No associated mass effect or edema. MRA HEAD IMPRESSION: 1. Loss of normal flow void within the left ICA to the siphon, consistent with occlusion as seen on corresponding MRA of the neck. Distal reconstitution at the left siphon with patent but attenuated flow within the left MCA distribution. 2. Otherwise wide patency of the intracranial arterial circulation. No other hemodynamically significant or correctable stenosis. MRA NECK IMPRESSION: 1. 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 Left ICA remains largely occluded to the skull base. 2. Atheromatous change about the right carotid bulb/proximal right ICA with associated stenosis of up to 60 percent by NASCET criteria. 3. Wide patency of both vertebral arteries within the neck. Left vertebral artery dominant. Electronically Signed   By: Morene Hoard M.D.   On: 04/08/2024 23:00   Result Date: 04/08/2024 CLINICAL DATA:  Initial evaluation for acute neuro deficit, stroke suspected. EXAM: MRI HEAD WITHOUT CONTRAST MRA HEAD WITHOUT CONTRAST MRA NECK WITHOUT CONTRAST TECHNIQUE: Multiplanar, multiecho pulse sequences of the brain and surrounding structures were obtained without intravenous contrast. Angiographic images of the Circle of Willis were obtained using MRA  technique without intravenous contrast. Angiographic images of the neck were obtained using MRA technique without intravenous contrast. Carotid stenosis measurements (when applicable) are obtained utilizing NASCET criteria, using the distal internal carotid diameter as the denominator. COMPARISON:  None Available. FINDINGS: MRI HEAD FINDINGS Brain: Diffuse prominence of the CSF containing spaces compatible generalized cerebral atrophy. Patchy T2/FLAIR hyperintensity involving the periventricular deep white matter both cerebral hemispheres, consistent with  chronic small vessel ischemic disease, mild-to-moderate in nature. Remote lacunar infarct present at the anterior right basal ganglia. Probable small remote right parietal infarct (series 12, image 32). No evidence for acute or subacute infarct. Gray-white matter adjacent maintained. No acute or chronic intracranial hemorrhage. 1.1 cm meningioma present along the falx without significant mass effect or edema (series 6, image 18). Additional small 1 cm meningioma overlies the left frontal convexity without mass effect or edema (series 6, image 17). No other mass lesion, mass effect, or midline shift. No hydrocephalus or extra-axial fluid collection. Vascular: Loss of normal flow void within the left ICA to the siphon, consistent with slow flow and/or occlusion. Major intracranial vascular flow voids are otherwise maintained. Skull and upper cervical spine: Cranial junction within normal limits. Bone marrow signal intensity overall within normal limits. No scalp soft tissue abnormality. Sinuses/Orbits: Globes orbital soft tissues within normal limits. Paranasal sinuses are largely clear. No mastoid effusion. Other: None. MRA HEAD FINDINGS ANTERIOR CIRCULATION: POSTERIOR CIRCULATION: MRA NECK FINDINGS Electronically Signed: By: Morene Hoard M.D. On: 04/08/2024 22:32   MR ANGIO HEAD WO CONTRAST Addendum Date: 04/08/2024 ADDENDUM REPORT: 04/08/2024 23:00  ADDENDUM: Please note that this examination was inadvertently signed off prior to completing the initial dictation. The following addendum includes findings for the MRA portions of the head and neck as well as the overall impression for these studies. MRA HEAD FINDINGS: ANTERIOR CIRCULATION: Right ICA widely patent the siphon without stenosis. Irregular attenuated intermittent flow seen within the visualized distal cervical left ICA, which largely occludes at the skull base. Attenuated distal reconstitution at the left carotid siphon the of the left ophthalmic artery. Right A1 segment patent. Left A1 attenuated but patent. Normal anterior communicating complex. Both ACAs patent without visible stenosis. Right M1 segment widely patent. Atheromatous irregularity about proximal M2 branch with mild stenoses, inferior division (series 1026, image 6). Right MCA branches otherwise widely patent. Attenuated but patent flow within the left M1 segment and distal left MCA branches. POSTERIOR CIRCULATION: Visualized vertebral arteries patent without stenosis. Left vertebral artery dominant. Both PICA patent at their origins. Basilar patent without stenosis. Superior cerebellar arteries patent bilaterally. Both PCAs primarily supplied via the basilar. Mild atheromatous irregularity about the PCAs with associated mild bilateral P2 stenoses. PCAs remain patent to their distal aspects. No intracranial aneurysm. MRA NECK FINDINGS: AORTIC ARCH: Examination mildly degraded by motion and lack of IV contrast. Visualized aortic arch within normal limits for caliber standard branch pattern. No visible stenosis about the origin of the great vessels. RIGHT CAROTID SYSTEM: Right common and internal carotid arteries are patent with antegrade flow. No visible dissection. Atheromatous change about the right carotid bulb/proximal right ICA with associated stenosis of up to 60 percent by NASCET criteria. LEFT CAROTID SYSTEM: Left CCA grossly  patent at its origin, and remains grossly patent to the bifurcation. Left ICA appears occluded at its origin at the bifurcation. Persisted perfusion within the left ECA and its major branches. VERTEBRAL ARTERIES: Both vertebral arteries arise from subclavian arteries. Left vertebral artery dominant. Vertebral arteries are patent with antegrade flow. No visible to stenosis or dissection. Other: None. IMPRESSION: MRI HEAD IMPRESSION: 1. No acute intracranial abnormality. 2. 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 3. Few small meningiomas measuring up to 1.1 cm along the falx and overlying the left frontal convexity as above. No associated mass effect or edema. MRA HEAD IMPRESSION: 1. Loss of normal flow void  within the left ICA to the siphon, consistent with occlusion as seen on corresponding MRA of the neck. Distal reconstitution at the left siphon with patent but attenuated flow within the left MCA distribution. 2. Otherwise wide patency of the intracranial arterial circulation. No other hemodynamically significant or correctable stenosis. MRA NECK IMPRESSION: 1. 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 Left ICA remains largely occluded to the skull base. 2. Atheromatous change about the right carotid bulb/proximal right ICA with associated stenosis of up to 60 percent by NASCET criteria. 3. Wide patency of both vertebral arteries within the neck. Left vertebral artery dominant. Electronically Signed   By: Morene Hoard M.D.   On: 04/08/2024 23:00   Result Date: 04/08/2024 CLINICAL DATA:  Initial evaluation for acute neuro deficit, stroke suspected. EXAM: MRI HEAD WITHOUT CONTRAST MRA HEAD WITHOUT CONTRAST MRA NECK WITHOUT CONTRAST TECHNIQUE: Multiplanar, multiecho pulse sequences of the brain and surrounding structures were obtained without intravenous contrast. Angiographic images of  the Circle of Willis were obtained using MRA technique without intravenous contrast. Angiographic images of the neck were obtained using MRA technique without intravenous contrast. Carotid stenosis measurements (when applicable) are obtained utilizing NASCET criteria, using the distal internal carotid diameter as the denominator. COMPARISON:  None Available. FINDINGS: MRI HEAD FINDINGS Brain: Diffuse prominence of the CSF containing spaces compatible generalized cerebral atrophy. Patchy T2/FLAIR hyperintensity involving the periventricular deep white matter both cerebral hemispheres, consistent with chronic small vessel ischemic disease, mild-to-moderate in nature. Remote lacunar infarct present at the anterior right basal ganglia. Probable small remote right parietal infarct (series 12, image 32). No evidence for acute or subacute infarct. Gray-white matter adjacent maintained. No acute or chronic intracranial hemorrhage. 1.1 cm meningioma present along the falx without significant mass effect or edema (series 6, image 18). Additional small 1 cm meningioma overlies the left frontal convexity without mass effect or edema (series 6, image 17). No other mass lesion, mass effect, or midline shift. No hydrocephalus or extra-axial fluid collection. Vascular: Loss of normal flow void within the left ICA to the siphon, consistent with slow flow and/or occlusion. Major intracranial vascular flow voids are otherwise maintained. Skull and upper cervical spine: Cranial junction within normal limits. Bone marrow signal intensity overall within normal limits. No scalp soft tissue abnormality. Sinuses/Orbits: Globes orbital soft tissues within normal limits. Paranasal sinuses are largely clear. No mastoid effusion. Other: None. MRA HEAD FINDINGS ANTERIOR CIRCULATION: POSTERIOR CIRCULATION: MRA NECK FINDINGS Electronically Signed: By: Morene Hoard M.D. On: 04/08/2024 22:32   MR ANGIO NECK WO CONTRAST Addendum Date:  04/08/2024 ADDENDUM REPORT: 04/08/2024 23:00 ADDENDUM: Please note that this examination was inadvertently signed off prior to completing the initial dictation. The following addendum includes findings for the MRA portions of the head and neck as well as the overall impression for these studies. MRA HEAD FINDINGS: ANTERIOR CIRCULATION: Right ICA widely patent the siphon without stenosis. Irregular attenuated intermittent flow seen within the visualized distal cervical left ICA, which largely occludes at the skull base. Attenuated distal reconstitution at the left carotid siphon the of the left ophthalmic artery. Right A1 segment patent. Left A1 attenuated but patent. Normal anterior communicating complex. Both ACAs patent without visible stenosis. Right M1 segment widely patent. Atheromatous irregularity about proximal M2 branch with mild stenoses, inferior division (series 1026, image 6). Right MCA branches otherwise widely patent. Attenuated but patent flow within the left M1 segment and distal left MCA branches. POSTERIOR CIRCULATION: Visualized vertebral arteries patent without stenosis.  Left vertebral artery dominant. Both PICA patent at their origins. Basilar patent without stenosis. Superior cerebellar arteries patent bilaterally. Both PCAs primarily supplied via the basilar. Mild atheromatous irregularity about the PCAs with associated mild bilateral P2 stenoses. PCAs remain patent to their distal aspects. No intracranial aneurysm. MRA NECK FINDINGS: AORTIC ARCH: Examination mildly degraded by motion and lack of IV contrast. Visualized aortic arch within normal limits for caliber standard branch pattern. No visible stenosis about the origin of the great vessels. RIGHT CAROTID SYSTEM: Right common and internal carotid arteries are patent with antegrade flow. No visible dissection. Atheromatous change about the right carotid bulb/proximal right ICA with associated stenosis of up to 60 percent by NASCET  criteria. LEFT CAROTID SYSTEM: Left CCA grossly patent at its origin, and remains grossly patent to the bifurcation. Left ICA appears occluded at its origin at the bifurcation. Persisted perfusion within the left ECA and its major branches. VERTEBRAL ARTERIES: Both vertebral arteries arise from subclavian arteries. Left vertebral artery dominant. Vertebral arteries are patent with antegrade flow. No visible to stenosis or dissection. Other: None. IMPRESSION: MRI HEAD IMPRESSION: 1. No acute intracranial abnormality. 2. 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 3. Few small meningiomas measuring up to 1.1 cm along the falx and overlying the left frontal convexity as above. No associated mass effect or edema. MRA HEAD IMPRESSION: 1. Loss of normal flow void within the left ICA to the siphon, consistent with occlusion as seen on corresponding MRA of the neck. Distal reconstitution at the left siphon with patent but attenuated flow within the left MCA distribution. 2. Otherwise wide patency of the intracranial arterial circulation. No other hemodynamically significant or correctable stenosis. MRA NECK IMPRESSION: 1. 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 Left ICA remains largely occluded to the skull base. 2. Atheromatous change about the right carotid bulb/proximal right ICA with associated stenosis of up to 60 percent by NASCET criteria. 3. Wide patency of both vertebral arteries within the neck. Left vertebral artery dominant. Electronically Signed   By: Morene Hoard M.D.   On: 04/08/2024 23:00   Result Date: 04/08/2024 CLINICAL DATA:  Initial evaluation for acute neuro deficit, stroke suspected. EXAM: MRI HEAD WITHOUT CONTRAST MRA HEAD WITHOUT CONTRAST MRA NECK WITHOUT CONTRAST TECHNIQUE: Multiplanar, multiecho pulse sequences of the brain and surrounding structures were obtained  without intravenous contrast. Angiographic images of the Circle of Willis were obtained using MRA technique without intravenous contrast. Angiographic images of the neck were obtained using MRA technique without intravenous contrast. Carotid stenosis measurements (when applicable) are obtained utilizing NASCET criteria, using the distal internal carotid diameter as the denominator. COMPARISON:  None Available. FINDINGS: MRI HEAD FINDINGS Brain: Diffuse prominence of the CSF containing spaces compatible generalized cerebral atrophy. Patchy T2/FLAIR hyperintensity involving the periventricular deep white matter both cerebral hemispheres, consistent with chronic small vessel ischemic disease, mild-to-moderate in nature. Remote lacunar infarct present at the anterior right basal ganglia. Probable small remote right parietal infarct (series 12, image 32). No evidence for acute or subacute infarct. Gray-white matter adjacent maintained. No acute or chronic intracranial hemorrhage. 1.1 cm meningioma present along the falx without significant mass effect or edema (series 6, image 18). Additional small 1 cm meningioma overlies the left frontal convexity without mass effect or edema (series 6, image 17). No other mass lesion, mass effect, or midline shift. No hydrocephalus or extra-axial fluid collection. Vascular: Loss of normal flow void  within the left ICA to the siphon, consistent with slow flow and/or occlusion. Major intracranial vascular flow voids are otherwise maintained. Skull and upper cervical spine: Cranial junction within normal limits. Bone marrow signal intensity overall within normal limits. No scalp soft tissue abnormality. Sinuses/Orbits: Globes orbital soft tissues within normal limits. Paranasal sinuses are largely clear. No mastoid effusion. Other: None. MRA HEAD FINDINGS ANTERIOR CIRCULATION: POSTERIOR CIRCULATION: MRA NECK FINDINGS Electronically Signed: By: Morene Hoard M.D. On: 04/08/2024  22:32   US  RENAL Result Date: 04/08/2024 CLINICAL DATA:  Acute renal injury EXAM: RENAL / URINARY TRACT ULTRASOUND COMPLETE COMPARISON:  None Available. FINDINGS: Right Kidney: Renal measurements: 8.6 x 5.6 x 5.1 cm. = volume: 128 mL. Echogenicity within normal limits. No mass or hydronephrosis visualized. Left Kidney: Renal measurements: 10.2 x 5.1 x 5.4 cm. = volume: 149 mL. Echogenicity within normal limits. No mass or hydronephrosis visualized. Bladder: Appears normal for degree of bladder distention. Other: Mild splenomegaly is noted IMPRESSION: Unremarkable kidneys. Mild splenomegaly. Electronically Signed   By: Oneil Devonshire M.D.   On: 04/08/2024 20:37   US  Abdomen Limited RUQ (LIVER/GB) Result Date: 04/08/2024 CLINICAL DATA:  Elevated liver enzymes. EXAM: ULTRASOUND ABDOMEN LIMITED RIGHT UPPER QUADRANT COMPARISON:  None Available. FINDINGS: Gallbladder: No gallstones or wall thickening visualized. No sonographic Murphy sign noted by sonographer. Apparent tiny nonshadowing and nonmobile nodules measure up to 5 mm, likely polyps. Common bile duct: Diameter: 5 mm Liver: There is diffuse increased liver echogenicity most commonly seen in the setting of fatty infiltration. Superimposed inflammation or fibrosis is not excluded. Clinical correlation is recommended. Portal vein is patent on color Doppler imaging with normal direction of blood flow towards the liver. Other: None. IMPRESSION: 1. No gallstone. 2. Tiny gallbladder polyps measure up to 5 mm. 3. Fatty liver. Electronically Signed   By: Vanetta Chou M.D.   On: 04/08/2024 20:35   DG Foot Complete Left Result Date: 04/08/2024 CLINICAL DATA:  Plantar wound. EXAM: LEFT FOOT - COMPLETE 3+ VIEW COMPARISON:  Left foot radiograph dated 10/15/2016. 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 No acute fracture or dislocation. The bones are osteopenic. There is  ulceration of the plantar skin at the fifth metatarsal. IMPRESSION: 1. No acute fracture or dislocation. 2. Plantar skin ulceration. Electronically Signed   By: Vanetta Chou M.D.   On: 04/08/2024 17:15   CT HEAD CODE STROKE WO CONTRAST Result Date: 04/08/2024 EXAM: CT HEAD WITHOUT CONTRAST 04/08/2024 02:51:00 PM TECHNIQUE: CT of the head was performed without the administration of intravenous contrast. Automated exposure control, iterative reconstruction, and/or weight based adjustment of the mA/kV was utilized to reduce the radiation dose to as low as reasonably achievable. COMPARISON: None available. CLINICAL HISTORY: Neuro deficit, acute, stroke suspected. Mental status change. FINDINGS: BRAIN AND VENTRICLES: A remote infarct is present in the anterior right frontal lobe with Wallerian degeneration extending inferiorly. Mild atrophy and white matter changes are present bilaterally. No acute hemorrhage. No evidence of acute infarct. No hydrocephalus. No extra-axial collection. No mass effect or midline shift. ORBITS: No acute abnormality. SINUSES: No acute abnormality. SOFT TISSUES AND SKULL: No acute soft tissue abnormality. No skull fracture. VASCULATURE: Atherosclerotic calcifications are present in the cavernous carotid arteries bilaterally. No hyperdense vessel is present. IMPRESSION: 1. No acute intracranial abnormality. 2. 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 3. Mild atrophy and white matter changes  bilaterally. 4. Atherosclerotic calcifications in the cavernous carotid arteries bilaterally. Findings were communicated to Dr Matthews via the Amion system at 2:58 pm Electronically signed by: Lonni Necessary MD 04/08/2024 03:00 PM EDT RP Workstation: [id number]   MM 3D SCREENING MAMMOGRAM BILATERAL BREAST Result Date: 03/30/2024 CLINICAL DATA:  Screening. EXAM: DIGITAL SCREENING BILATERAL MAMMOGRAM WITH TOMOSYNTHESIS AND CAD TECHNIQUE:  Bilateral screening digital craniocaudal and mediolateral oblique mammograms were obtained. Bilateral screening digital breast tomosynthesis was performed. The images were evaluated with computer-aided detection. COMPARISON:  Previous exam(s). ACR Breast Density Category b: There are scattered areas of fibroglandular density. FINDINGS: There are no findings suspicious for malignancy. IMPRESSION: No mammographic evidence of malignancy. A result letter of this screening mammogram will be mailed directly to the patient. RECOMMENDATION: Screening mammogram in one year. (Code:[id number]) BI-RADS CATEGORY  1: Negative. Electronically Signed   By: Alm Parkins M.D.   On: 03/30/2024 11:44    Microbiology: Results for orders placed or performed during the hospital encounter of 04/08/24  Resp panel by RT-PCR (RSV, Flu A&B, Covid) Anterior Nasal Swab     Status: None   Collection Time: 04/08/24  3:11 PM   Specimen: Anterior Nasal Swab  Result Value Ref Range Status   SARS Coronavirus 2 by RT PCR NEGATIVE NEGATIVE Final    Comment: (NOTE) SARS-CoV-2 target nucleic acids are NOT DETECTED.  The SARS-CoV-2 RNA is generally detectable in upper respiratory specimens during the acute phase of infection. The lowest concentration of SARS-CoV-2 viral copies this assay can detect is 138 copies/mL. A negative result does not preclude SARS-Cov-2 infection and should not be used as the sole basis for treatment or other patient management decisions. A negative result may occur with  improper specimen collection/handling, submission of specimen other than nasopharyngeal swab, presence of viral mutation(s) within the areas targeted by this assay, and inadequate number of viral copies(<138 copies/mL). A negative result must be combined with clinical observations, patient history, and epidemiological information. The expected result is Negative.  Fact Sheet for Patients:   BloggerCourse.com  Fact Sheet for Healthcare Providers:  SeriousBroker.it  This test is no t yet approved or cleared by the [place]  FDA and  has been authorized for detection and/or diagnosis of SARS-CoV-2 by FDA under an Emergency Use Authorization (EUA). This EUA will remain  in effect (meaning this test can be used) for the duration of the COVID-19 declaration under Section 564(b)(1) of the Act, 21 U.S.C.section 360bbb-3(b)(1), unless the authorization is terminated  or revoked sooner.       Influenza A by PCR NEGATIVE NEGATIVE Final   Influenza B by PCR NEGATIVE NEGATIVE Final    Comment: (NOTE) The Xpert Xpress SARS-CoV-2/FLU/RSV plus assay is intended as an aid in the diagnosis of influenza from Nasopharyngeal swab specimens and should not be used as a sole basis for treatment. Nasal washings and aspirates are unacceptable for Xpert Xpress SARS-CoV-2/FLU/RSV testing.  Fact Sheet for Patients: BloggerCourse.com  Fact Sheet for Healthcare Providers: SeriousBroker.it  This test is not yet approved or cleared by the [place]  FDA and has been authorized for detection and/or diagnosis of SARS-CoV-2 by FDA under an Emergency Use Authorization (EUA). This EUA will remain in effect (meaning this test can be used) for the duration of the COVID-19 declaration under Section 564(b)(1) of the Act, 21 U.S.C. section 360bbb-3(b)(1), unless the authorization is terminated or revoked.     Resp Syncytial Virus by PCR NEGATIVE NEGATIVE Final    Comment: (NOTE) Fact Sheet for  Patients: BloggerCourse.com  Fact Sheet for Healthcare Providers: SeriousBroker.it  This test is not yet approved or cleared by the [place]  FDA and has been authorized for detection and/or diagnosis of SARS-CoV-2 by FDA under an Emergency Use  Authorization (EUA). This EUA will remain in effect (meaning this test can be used) for the duration of the COVID-19 declaration under Section 564(b)(1) of the Act, 21 U.S.C. section 360bbb-3(b)(1), unless the authorization is terminated or revoked.  Performed at [facility], [address] [place], [place] [address]   Blood culture (routine x 2)     Status: Abnormal   Collection Time: 04/08/24  4:10 PM   Specimen: BLOOD  Result Value Ref Range Status   Specimen Description   Final    BLOOD LEFT ANTECUBITAL Performed at [facility], [address], [place], [place] [address]    Special Requests   Final    BOTTLES DRAWN AEROBIC AND ANAEROBIC Blood Culture adequate volume Performed at [facility], [address] [place], [place] [address]    Culture  Setup Time   Final    GRAM POSITIVE COCCI IN BOTH AEROBIC AND ANAEROBIC BOTTLES Organism ID to follow CRITICAL RESULT CALLED TO, READ BACK BY AND VERIFIED WITH: ALEX CHAPPELL PHARMD [id number] 04/09/24 HNM GRAM STAIN REVIEWED-AGREE WITH RESULT drt Performed at [facility], [address] [place], [place] [address]    Culture STAPHYLOCOCCUS AUREUS (A)  Final   Report Status 04/11/2024 FINAL  Final   Organism ID, Bacteria STAPHYLOCOCCUS AUREUS  Final      Susceptibility   Staphylococcus aureus - MIC*    CIPROFLOXACIN <=0.5 SENSITIVE Sensitive     ERYTHROMYCIN <=0.25 SENSITIVE Sensitive     GENTAMICIN <=0.5 SENSITIVE Sensitive     OXACILLIN 0.5 SENSITIVE Sensitive     TETRACYCLINE >=16 RESISTANT Resistant     VANCOMYCIN  <=0.5 SENSITIVE Sensitive     TRIMETH/SULFA <=10 SENSITIVE Sensitive     CLINDAMYCIN  <=0.25 SENSITIVE Sensitive     RIFAMPIN <=0.5 SENSITIVE Sensitive     Inducible Clindamycin  NEGATIVE Sensitive     LINEZOLID 2 SENSITIVE Sensitive     * STAPHYLOCOCCUS AUREUS  Blood Culture ID Panel (Reflexed)     Status: Abnormal   Collection Time: 04/08/24  4:10 PM  Result Value Ref Range  Status   Enterococcus faecalis NOT DETECTED NOT DETECTED Final   Enterococcus Faecium NOT DETECTED NOT DETECTED Final   Listeria monocytogenes NOT DETECTED NOT DETECTED Final   Staphylococcus species DETECTED (A) NOT DETECTED Final    Comment: CRITICAL RESULT CALLED TO, READ BACK BY AND VERIFIED WITH: ALEX CHAPPELL PHARMD [id number] 04/09/24 HNM    Staphylococcus aureus (BCID) DETECTED (A) NOT DETECTED Final    Comment: CRITICAL RESULT CALLED TO, READ BACK BY AND VERIFIED WITH: ALEX CHAPPELL PHARMD [id number] 04/09/24 HNM    Staphylococcus epidermidis NOT DETECTED NOT DETECTED Final   Staphylococcus lugdunensis NOT DETECTED NOT DETECTED Final   Streptococcus species NOT DETECTED NOT DETECTED Final   Streptococcus agalactiae NOT DETECTED NOT DETECTED Final   Streptococcus pneumoniae NOT DETECTED NOT DETECTED Final   Streptococcus pyogenes NOT DETECTED NOT DETECTED Final   A.calcoaceticus-baumannii NOT DETECTED NOT DETECTED Final   Bacteroides fragilis NOT DETECTED NOT DETECTED Final   Enterobacterales NOT DETECTED NOT DETECTED Final   Enterobacter cloacae complex NOT DETECTED NOT DETECTED Final   Escherichia coli NOT DETECTED NOT DETECTED Final   Klebsiella aerogenes NOT DETECTED NOT DETECTED Final   Klebsiella oxytoca NOT DETECTED  NOT DETECTED Final   Klebsiella pneumoniae NOT DETECTED NOT DETECTED Final   Proteus species NOT DETECTED NOT DETECTED Final   Salmonella species NOT DETECTED NOT DETECTED Final   Serratia marcescens NOT DETECTED NOT DETECTED Final   Haemophilus influenzae NOT DETECTED NOT DETECTED Final   Neisseria meningitidis NOT DETECTED NOT DETECTED Final   Pseudomonas aeruginosa NOT DETECTED NOT DETECTED Final   Stenotrophomonas maltophilia NOT DETECTED NOT DETECTED Final   Candida albicans NOT DETECTED NOT DETECTED Final   Candida auris NOT DETECTED NOT DETECTED Final   Candida glabrata NOT DETECTED NOT DETECTED Final   Candida krusei NOT DETECTED NOT DETECTED Final   Candida  parapsilosis NOT DETECTED NOT DETECTED Final   Candida tropicalis NOT DETECTED NOT DETECTED Final   Cryptococcus neoformans/gattii NOT DETECTED NOT DETECTED Final   Meth resistant mecA/C and MREJ NOT DETECTED NOT DETECTED Final    Comment: Performed at [facility], [address] [place], [place] [address]  Blood culture (routine x 2)     Status: Abnormal   Collection Time: 04/08/24  4:21 PM   Specimen: BLOOD LEFT FOREARM  Result Value Ref Range Status   Specimen Description   Final    BLOOD LEFT FOREARM Performed at [facility], [address] [place], [place] [address]    Special Requests   Final    BOTTLES DRAWN AEROBIC AND ANAEROBIC Blood Culture adequate volume Performed at [facility], [address] [place], [place] [address]    Culture  Setup Time   Final    GRAM POSITIVE COCCI IN BOTH AEROBIC AND ANAEROBIC BOTTLES CRITICAL RESULT CALLED TO, READ BACK BY AND VERIFIED WITH: ALEX CHAPPELL PHARMD [id number] 04/09/24 HNM GRAM STAIN REVIEWED-AGREE WITH RESULT DRT    Culture (A)  Final    STAPHYLOCOCCUS AUREUS SUSCEPTIBILITIES PERFORMED ON PREVIOUS CULTURE WITHIN THE LAST 5 DAYS. Performed at [facility], [address] [place], [place] [address]    Report Status 04/11/2024 FINAL  Final  Aerobic/Anaerobic Culture w Gram Stain (surgical/deep wound)     Status: None (Preliminary result)   Collection Time: 04/08/24 11:00 PM   Specimen: Wound  Result Value Ref Range Status   Specimen Description   Final    WOUND Performed at [facility], [address] [place], [place] [address]    Special Requests   Final    LEFT FOOT Performed at [facility], [address] [place], [place] [address]    Gram Stain   Final    RARE WBC PRESENT, PREDOMINANTLY PMN MODERATE GRAM POSITIVE COCCI RARE GRAM NEGATIVE RODS    Culture   Final    MODERATE ESCHERICHIA COLI FEW PROTEUS MIRABILIS MODERATE STAPHYLOCOCCUS AUREUS FEW BACTEROIDES  THETAIOTAOMICRON BETA LACTAMASE POSITIVE SUSCEPTIBILITIES TO FOLLOW Performed at [facility], [address], [place], [place] [address]    Report Status PENDING  Incomplete   Organism ID, Bacteria ESCHERICHIA COLI  Final   Organism ID, Bacteria PROTEUS MIRABILIS  Final      Susceptibility   Escherichia coli - MIC*    AMPICILLIN 4 SENSITIVE Sensitive     CEFAZOLIN  (NON-URINE) <=1 SENSITIVE Sensitive     CEFEPIME  <=0.12 SENSITIVE Sensitive     ERTAPENEM <=0.12 SENSITIVE Sensitive     CEFTRIAXONE  <=0.25 SENSITIVE Sensitive     CIPROFLOXACIN <=0.06 SENSITIVE Sensitive     GENTAMICIN <=1 SENSITIVE Sensitive     MEROPENEM  <=0.25 SENSITIVE Sensitive     TRIMETH/SULFA <=20  SENSITIVE Sensitive     AMPICILLIN/SULBACTAM <=2 SENSITIVE Sensitive     PIP/TAZO Value in next row Sensitive      <=4 SENSITIVEThis is a modified FDA-approved test that has been validated and its performance characteristics determined by the reporting laboratory.  This laboratory is certified under the Clinical Laboratory Improvement Amendments CLIA as qualified to perform high complexity clinical laboratory testing.    * MODERATE ESCHERICHIA COLI   Proteus mirabilis - MIC*    AMPICILLIN Value in next row Sensitive      <=4 SENSITIVEThis is a modified FDA-approved test that has been validated and its performance characteristics determined by the reporting laboratory.  This laboratory is certified under the Clinical Laboratory Improvement Amendments CLIA as qualified to perform high complexity clinical laboratory testing.    CEFAZOLIN  (NON-URINE) Value in next row Intermediate      <=4 SENSITIVEThis is a modified FDA-approved test that has been validated and its performance characteristics determined by the reporting laboratory.  This laboratory is certified under the Clinical Laboratory Improvement Amendments CLIA as qualified to perform high complexity clinical laboratory testing.    CEFEPIME  Value in next row  Sensitive      <=4 SENSITIVEThis is a modified FDA-approved test that has been validated and its performance characteristics determined by the reporting laboratory.  This laboratory is certified under the Clinical Laboratory Improvement Amendments CLIA as qualified to perform high complexity clinical laboratory testing.    ERTAPENEM Value in next row Sensitive      <=4 SENSITIVEThis is a modified FDA-approved test that has been validated and its performance characteristics determined by the reporting laboratory.  This laboratory is certified under the Clinical Laboratory Improvement Amendments CLIA as qualified to perform high complexity clinical laboratory testing.    CEFTRIAXONE  Value in next row Sensitive      <=4 SENSITIVEThis is a modified FDA-approved test that has been validated and its performance characteristics determined by the reporting laboratory.  This laboratory is certified under the Clinical Laboratory Improvement Amendments CLIA as qualified to perform high complexity clinical laboratory testing.    CIPROFLOXACIN Value in next row Sensitive      <=4 SENSITIVEThis is a modified FDA-approved test that has been validated and its performance characteristics determined by the reporting laboratory.  This laboratory is certified under the Clinical Laboratory Improvement Amendments CLIA as qualified to perform high complexity clinical laboratory testing.    GENTAMICIN Value in next row Sensitive      <=4 SENSITIVEThis is a modified FDA-approved test that has been validated and its performance characteristics determined by the reporting laboratory.  This laboratory is certified under the Clinical Laboratory Improvement Amendments CLIA as qualified to perform high complexity clinical laboratory testing.    MEROPENEM  Value in next row Sensitive      <=4 SENSITIVEThis is a modified FDA-approved test that has been validated and its performance characteristics determined by the reporting laboratory.   This laboratory is certified under the Clinical Laboratory Improvement Amendments CLIA as qualified to perform high complexity clinical laboratory testing.    TRIMETH/SULFA Value in next row Sensitive      <=4 SENSITIVEThis is a modified FDA-approved test that has been validated and its performance characteristics determined by the reporting laboratory.  This laboratory is certified under the Clinical Laboratory Improvement Amendments CLIA as qualified to perform high complexity clinical laboratory testing.    AMPICILLIN/SULBACTAM Value in next row Sensitive      <=4 SENSITIVEThis is a modified FDA-approved test that has  been validated and its performance characteristics determined by the reporting laboratory.  This laboratory is certified under the Clinical Laboratory Improvement Amendments CLIA as qualified to perform high complexity clinical laboratory testing.    PIP/TAZO Value in next row Sensitive      <=4 SENSITIVEThis is a modified FDA-approved test that has been validated and its performance characteristics determined by the reporting laboratory.  This laboratory is certified under the Clinical Laboratory Improvement Amendments CLIA as qualified to perform high complexity clinical laboratory testing.    * FEW PROTEUS MIRABILIS  Aerobic/Anaerobic Culture w Gram Stain (surgical/deep wound)     Status: None (Preliminary result)   Collection Time: 04/09/24 12:48 PM   Specimen: Wound; Tissue  Result Value Ref Range Status   Specimen Description   Final    TISSUE Performed at [facility], [address] [place], [place] [address]    Special Requests   Final    LEFT FOOT DEEP WOUND Performed at [facility], [address] [place], [place] [address]    Gram Stain   Final    NO WBC SEEN NO ORGANISMS SEEN Performed at [facility], [address] [place], [place] [address]    Culture   Final    RARE STAPHYLOCOCCUS AUREUS RARE PROTEUS MIRABILIS NO ANAEROBES  ISOLATED; CULTURE IN PROGRESS FOR 5 DAYS    Report Status PENDING  Incomplete   Organism ID, Bacteria STAPHYLOCOCCUS AUREUS  Final   Organism ID, Bacteria PROTEUS MIRABILIS  Final      Susceptibility   Proteus mirabilis - MIC*    AMPICILLIN <=2 SENSITIVE Sensitive     CEFAZOLIN  (NON-URINE) 4 INTERMEDIATE Intermediate     CEFEPIME  <=0.12 SENSITIVE Sensitive     ERTAPENEM <=0.12 SENSITIVE Sensitive     CEFTRIAXONE  <=0.25 SENSITIVE Sensitive     CIPROFLOXACIN <=0.06 SENSITIVE Sensitive     GENTAMICIN <=1 SENSITIVE Sensitive     MEROPENEM  0.5 SENSITIVE Sensitive     TRIMETH/SULFA <=20 SENSITIVE Sensitive     AMPICILLIN/SULBACTAM <=2 SENSITIVE Sensitive     PIP/TAZO Value in next row Sensitive      <=4 SENSITIVEThis is a modified FDA-approved test that has been validated and its performance characteristics determined by the reporting laboratory.  This laboratory is certified under the Clinical Laboratory Improvement Amendments CLIA as qualified to perform high complexity clinical laboratory testing.    * RARE PROTEUS MIRABILIS   Staphylococcus aureus - MIC*    CIPROFLOXACIN Value in next row Sensitive      <=4 SENSITIVEThis is a modified FDA-approved test that has been validated and its performance characteristics determined by the reporting laboratory.  This laboratory is certified under the Clinical Laboratory Improvement Amendments CLIA as qualified to perform high complexity clinical laboratory testing.    ERYTHROMYCIN Value in next row Sensitive      <=4 SENSITIVEThis is a modified FDA-approved test that has been validated and its performance characteristics determined by the reporting laboratory.  This laboratory is certified under the Clinical Laboratory Improvement Amendments CLIA as qualified to perform high complexity clinical laboratory testing.    GENTAMICIN Value in next row Sensitive      <=4 SENSITIVEThis is a modified FDA-approved test that has been validated and its performance  characteristics determined by the reporting laboratory.  This laboratory is certified under the Clinical Laboratory Improvement Amendments CLIA as qualified to perform high complexity clinical laboratory testing.    OXACILLIN Value in next row Sensitive      <=  4 SENSITIVEThis is a modified FDA-approved test that has been validated and its performance characteristics determined by the reporting laboratory.  This laboratory is certified under the Clinical Laboratory Improvement Amendments CLIA as qualified to perform high complexity clinical laboratory testing.    TETRACYCLINE Value in next row Resistant      <=4 SENSITIVEThis is a modified FDA-approved test that has been validated and its performance characteristics determined by the reporting laboratory.  This laboratory is certified under the Clinical Laboratory Improvement Amendments CLIA as qualified to perform high complexity clinical laboratory testing.    VANCOMYCIN  Value in next row Sensitive      <=4 SENSITIVEThis is a modified FDA-approved test that has been validated and its performance characteristics determined by the reporting laboratory.  This laboratory is certified under the Clinical Laboratory Improvement Amendments CLIA as qualified to perform high complexity clinical laboratory testing.    TRIMETH/SULFA Value in next row Sensitive      <=4 SENSITIVEThis is a modified FDA-approved test that has been validated and its performance characteristics determined by the reporting laboratory.  This laboratory is certified under the Clinical Laboratory Improvement Amendments CLIA as qualified to perform high complexity clinical laboratory testing.    CLINDAMYCIN  Value in next row Sensitive      <=4 SENSITIVEThis is a modified FDA-approved test that has been validated and its performance characteristics determined by the reporting laboratory.  This laboratory is certified under the Clinical Laboratory Improvement Amendments CLIA as qualified to  perform high complexity clinical laboratory testing.    RIFAMPIN Value in next row Sensitive      <=4 SENSITIVEThis is a modified FDA-approved test that has been validated and its performance characteristics determined by the reporting laboratory.  This laboratory is certified under the Clinical Laboratory Improvement Amendments CLIA as qualified to perform high complexity clinical laboratory testing.    Inducible Clindamycin  Value in next row Sensitive      <=4 SENSITIVEThis is a modified FDA-approved test that has been validated and its performance characteristics determined by the reporting laboratory.  This laboratory is certified under the Clinical Laboratory Improvement Amendments CLIA as qualified to perform high complexity clinical laboratory testing.    LINEZOLID Value in next row Sensitive      <=4 SENSITIVEThis is a modified FDA-approved test that has been validated and its performance characteristics determined by the reporting laboratory.  This laboratory is certified under the Clinical Laboratory Improvement Amendments CLIA as qualified to perform high complexity clinical laboratory testing.    * RARE STAPHYLOCOCCUS AUREUS  Culture, blood (Routine X 2) w Reflex to ID Panel     Status: None (Preliminary result)   Collection Time: 04/10/24  5:17 AM   Specimen: BLOOD LEFT ARM  Result Value Ref Range Status   Specimen Description BLOOD LEFT ARM  Final   Special Requests   Final    BOTTLES DRAWN AEROBIC AND ANAEROBIC Blood Culture adequate volume   Culture   Final    NO GROWTH 2 DAYS Performed at [facility], [address], [place], [place] [address]    Report Status PENDING  Incomplete  Culture, blood (Routine X 2) w Reflex to ID Panel     Status: None (Preliminary result)   Collection Time: 04/10/24  5:17 AM   Specimen: BLOOD LEFT HAND  Result Value Ref Range Status   Specimen Description BLOOD LEFT HAND  Final   Special Requests   Final    BOTTLES DRAWN AEROBIC AND  ANAEROBIC Blood  Culture adequate volume   Culture   Final    NO GROWTH 2 DAYS Performed at [facility], [address] [place], [place] [address]    Report Status PENDING  Incomplete    Labs: CBC: Recent Labs  Lab 04/08/24 1621 04/08/24 2330 04/09/24 0426 04/10/24 0517 04/11/24 0544  WBC 12.8* 8.9 8.4 6.7 8.0  NEUTROABS 11.5*  --   --   --   --   HGB 13.0 11.5* 11.1* 11.7* 12.9  HCT 38.5 34.7* 33.5* 35.3* 38.2  MCV 87.9 89.7 88.4 88.5 86.6  PLT 163 123* 135* 153 170   Basic Metabolic Panel: Recent Labs  Lab 04/08/24 1438 04/08/24 2254 04/09/24 0426 04/10/24 0517 04/11/24 0544 04/11/24 0848 04/12/24 0542  NA 134*  --  138 136 138  --  136  K 3.2*  --  3.8 3.8 3.0*  --  4.0  CL 94*  --  100 102 106  --  105  CO2 27  --  26 23 24   --  21*  GLUCOSE 172*  --  128* 160* 93  --  135*  BUN 21  --  26* 30* 28*  --  22  CREATININE 1.31* 1.11* 1.32* 1.13* 1.06*  --  0.89  CALCIUM  9.5  --  8.3* 8.3* 8.5*  --  8.4*  MG 1.4*  --  2.5*  --   --  1.9  --   PHOS  --   --  3.3  --   --   --   --    Liver Function Tests: Recent Labs  Lab 04/08/24 1438 04/09/24 0426 04/10/24 0517  AST 76* 51* 40  ALT 44 37 32  ALKPHOS 135* 101 91  BILITOT 2.7* 1.6* 1.0  PROT 7.1 6.1* 6.0*  ALBUMIN 3.0* 2.5* 2.2*   CBG: Recent Labs  Lab 04/11/24 1136 04/11/24 1706 04/11/24 2007 04/11/24 2225 04/12/24 0825  GLUCAP 127* 174* 172* 116* 115*    Discharge time spent: greater than 30 minutes.  Signed: Leita Blanch, MD [facility] 04/12/2024

## 2024-04-13 LAB — AEROBIC/ANAEROBIC CULTURE W GRAM STAIN (SURGICAL/DEEP WOUND)

## 2024-04-14 LAB — AEROBIC/ANAEROBIC CULTURE W GRAM STAIN (SURGICAL/DEEP WOUND): Gram Stain: NONE SEEN

## 2024-04-15 LAB — CULTURE, BLOOD (ROUTINE X 2)
Culture: NO GROWTH
Culture: NO GROWTH
Special Requests: ADEQUATE
Special Requests: ADEQUATE

## 2024-04-24 ENCOUNTER — Inpatient Hospital Stay: Admission: EM | Admit: 2024-04-24 | Discharge: 2024-04-27 | DRG: 638 | Disposition: A | Discharge status: Home Health

## 2024-04-24 ENCOUNTER — Encounter: Payer: Self-pay | Admitting: Emergency Medicine

## 2024-04-24 ENCOUNTER — Other Ambulatory Visit: Payer: Self-pay

## 2024-04-24 DIAGNOSIS — Z79899 Other long term (current) drug therapy: Secondary | ICD-10-CM | POA: Diagnosis not present

## 2024-04-24 DIAGNOSIS — B9561 Methicillin susceptible Staphylococcus aureus infection as the cause of diseases classified elsewhere: Secondary | ICD-10-CM | POA: Diagnosis present

## 2024-04-24 DIAGNOSIS — Z7984 Long term (current) use of oral hypoglycemic drugs: Secondary | ICD-10-CM | POA: Diagnosis not present

## 2024-04-24 DIAGNOSIS — T797XXA Traumatic subcutaneous emphysema, initial encounter: Secondary | ICD-10-CM | POA: Diagnosis present

## 2024-04-24 DIAGNOSIS — T148XXA Other injury of unspecified body region, initial encounter: Secondary | ICD-10-CM | POA: Diagnosis present

## 2024-04-24 DIAGNOSIS — Z7982 Long term (current) use of aspirin: Secondary | ICD-10-CM

## 2024-04-24 DIAGNOSIS — R710 Precipitous drop in hematocrit: Secondary | ICD-10-CM | POA: Diagnosis present

## 2024-04-24 DIAGNOSIS — Z8249 Family history of ischemic heart disease and other diseases of the circulatory system: Secondary | ICD-10-CM

## 2024-04-24 DIAGNOSIS — E1122 Type 2 diabetes mellitus with diabetic chronic kidney disease: Secondary | ICD-10-CM | POA: Diagnosis present

## 2024-04-24 DIAGNOSIS — R7881 Bacteremia: Secondary | ICD-10-CM | POA: Diagnosis not present

## 2024-04-24 DIAGNOSIS — Z89422 Acquired absence of other left toe(s): Secondary | ICD-10-CM

## 2024-04-24 DIAGNOSIS — Z888 Allergy status to other drugs, medicaments and biological substances status: Secondary | ICD-10-CM

## 2024-04-24 DIAGNOSIS — I10 Essential (primary) hypertension: Secondary | ICD-10-CM | POA: Diagnosis present

## 2024-04-24 DIAGNOSIS — Z923 Personal history of irradiation: Secondary | ICD-10-CM

## 2024-04-24 DIAGNOSIS — Z853 Personal history of malignant neoplasm of breast: Secondary | ICD-10-CM

## 2024-04-24 DIAGNOSIS — Z8619 Personal history of other infectious and parasitic diseases: Secondary | ICD-10-CM

## 2024-04-24 DIAGNOSIS — Z8673 Personal history of transient ischemic attack (TIA), and cerebral infarction without residual deficits: Secondary | ICD-10-CM

## 2024-04-24 DIAGNOSIS — X58XXXA Exposure to other specified factors, initial encounter: Secondary | ICD-10-CM | POA: Diagnosis present

## 2024-04-24 DIAGNOSIS — B964 Proteus (mirabilis) (morganii) as the cause of diseases classified elsewhere: Secondary | ICD-10-CM | POA: Diagnosis present

## 2024-04-24 DIAGNOSIS — I129 Hypertensive chronic kidney disease with stage 1 through stage 4 chronic kidney disease, or unspecified chronic kidney disease: Secondary | ICD-10-CM | POA: Diagnosis present

## 2024-04-24 DIAGNOSIS — E1140 Type 2 diabetes mellitus with diabetic neuropathy, unspecified: Secondary | ICD-10-CM | POA: Diagnosis present

## 2024-04-24 DIAGNOSIS — L089 Local infection of the skin and subcutaneous tissue, unspecified: Principal | ICD-10-CM | POA: Diagnosis present

## 2024-04-24 DIAGNOSIS — E1169 Type 2 diabetes mellitus with other specified complication: Secondary | ICD-10-CM | POA: Diagnosis present

## 2024-04-24 DIAGNOSIS — Z89421 Acquired absence of other right toe(s): Secondary | ICD-10-CM | POA: Diagnosis not present

## 2024-04-24 DIAGNOSIS — Z880 Allergy status to penicillin: Secondary | ICD-10-CM

## 2024-04-24 DIAGNOSIS — E785 Hyperlipidemia, unspecified: Secondary | ICD-10-CM | POA: Diagnosis present

## 2024-04-24 DIAGNOSIS — E11621 Type 2 diabetes mellitus with foot ulcer: Secondary | ICD-10-CM | POA: Diagnosis present

## 2024-04-24 DIAGNOSIS — L02612 Cutaneous abscess of left foot: Secondary | ICD-10-CM | POA: Diagnosis present

## 2024-04-24 DIAGNOSIS — K219 Gastro-esophageal reflux disease without esophagitis: Secondary | ICD-10-CM | POA: Diagnosis present

## 2024-04-24 DIAGNOSIS — L97429 Non-pressure chronic ulcer of left heel and midfoot with unspecified severity: Secondary | ICD-10-CM | POA: Diagnosis present

## 2024-04-24 DIAGNOSIS — I96 Gangrene, not elsewhere classified: Secondary | ICD-10-CM | POA: Diagnosis not present

## 2024-04-24 DIAGNOSIS — N1831 Chronic kidney disease, stage 3a: Secondary | ICD-10-CM | POA: Diagnosis present

## 2024-04-24 DIAGNOSIS — E119 Type 2 diabetes mellitus without complications: Secondary | ICD-10-CM

## 2024-04-24 DIAGNOSIS — M869 Osteomyelitis, unspecified: Secondary | ICD-10-CM | POA: Diagnosis present

## 2024-04-24 DIAGNOSIS — L84 Corns and callosities: Secondary | ICD-10-CM | POA: Diagnosis not present

## 2024-04-24 DIAGNOSIS — Z7902 Long term (current) use of antithrombotics/antiplatelets: Secondary | ICD-10-CM

## 2024-04-24 LAB — COMPREHENSIVE METABOLIC PANEL WITH GFR
ALT: 10 U/L (ref 0–44)
AST: 58 U/L — ABNORMAL HIGH (ref 15–41)
Albumin: 2.8 g/dL — ABNORMAL LOW (ref 3.5–5.0)
Alkaline Phosphatase: 116 U/L (ref 38–126)
Anion gap: 14 (ref 5–15)
BUN: 28 mg/dL — ABNORMAL HIGH (ref 8–23)
CO2: 22 mmol/L (ref 22–32)
Calcium: 9.6 mg/dL (ref 8.9–10.3)
Chloride: 102 mmol/L (ref 98–111)
Creatinine, Ser: 1.36 mg/dL — ABNORMAL HIGH (ref 0.44–1.00)
GFR, Estimated: 39 mL/min — ABNORMAL LOW (ref >60)
Glucose, Bld: 92 mg/dL (ref 70–99)
Potassium: 4.3 mmol/L (ref 3.5–5.1)
Sodium: 138 mmol/L (ref 135–145)
Total Bilirubin: 0.8 mg/dL (ref 0.0–1.2)
Total Protein: 6.7 g/dL (ref 6.5–8.1)

## 2024-04-24 LAB — CBC
HCT: 31.3 % — ABNORMAL LOW (ref 36.0–46.0)
Hemoglobin: 10.3 g/dL — ABNORMAL LOW (ref 12.0–15.0)
MCH: 29.1 pg (ref 26.0–34.0)
MCHC: 32.9 g/dL (ref 30.0–36.0)
MCV: 88.4 fL (ref 80.0–100.0)
Platelets: 226 K/uL (ref 150–400)
RBC: 3.54 MIL/uL — ABNORMAL LOW (ref 3.87–5.11)
RDW: 15.6 % — ABNORMAL HIGH (ref 11.5–15.5)
WBC: 5.9 K/uL (ref 4.0–10.5)
nRBC: 0 % (ref 0.0–0.2)

## 2024-04-24 LAB — SEDIMENTATION RATE: Sed Rate: 34 mm/h — ABNORMAL HIGH (ref 0–30)

## 2024-04-24 LAB — CBC WITH DIFFERENTIAL/PLATELET
Abs Immature Granulocytes: 0.02 K/uL (ref 0.00–0.07)
Basophils Absolute: 0.1 K/uL (ref 0.0–0.1)
Basophils Relative: 2 %
Eosinophils Absolute: 0.2 K/uL (ref 0.0–0.5)
Eosinophils Relative: 2 %
HCT: 34 % — ABNORMAL LOW (ref 36.0–46.0)
Hemoglobin: 11.1 g/dL — ABNORMAL LOW (ref 12.0–15.0)
Immature Granulocytes: 0 %
Lymphocytes Relative: 17 %
Lymphs Abs: 1.4 K/uL (ref 0.7–4.0)
MCH: 29.4 pg (ref 26.0–34.0)
MCHC: 32.6 g/dL (ref 30.0–36.0)
MCV: 89.9 fL (ref 80.0–100.0)
Monocytes Absolute: 0.7 K/uL (ref 0.1–1.0)
Monocytes Relative: 9 %
Neutro Abs: 5.7 K/uL (ref 1.7–7.7)
Neutrophils Relative %: 70 %
Platelets: 279 K/uL (ref 150–400)
RBC: 3.78 MIL/uL — ABNORMAL LOW (ref 3.87–5.11)
RDW: 15.7 % — ABNORMAL HIGH (ref 11.5–15.5)
WBC: 8.2 K/uL (ref 4.0–10.5)
nRBC: 0 % (ref 0.0–0.2)

## 2024-04-24 LAB — GLUCOSE, CAPILLARY: Glucose-Capillary: 109 mg/dL — ABNORMAL HIGH (ref 70–99)

## 2024-04-24 LAB — CREATININE, SERUM
Creatinine, Ser: 1.41 mg/dL — ABNORMAL HIGH (ref 0.44–1.00)
GFR, Estimated: 38 mL/min — ABNORMAL LOW (ref >60)

## 2024-04-24 LAB — C-REACTIVE PROTEIN: CRP: 0.6 mg/dL (ref <1.0)

## 2024-04-24 LAB — CBG MONITORING, ED: Glucose-Capillary: 97 mg/dL (ref 70–99)

## 2024-04-24 MED ORDER — INSULIN ASPART 100 UNIT/ML IJ SOLN: 0.0000 [IU] | Freq: Every day | INTRAMUSCULAR | Status: DC

## 2024-04-24 MED ORDER — CEFAZOLIN SODIUM-DEXTROSE 2-4 GM/100ML-% IV SOLN
2.0000 g | Freq: Two times a day (BID) | INTRAVENOUS | Status: DC
  Administered 2024-04-24 – 2024-04-26 (×4): 2 g via INTRAVENOUS
  Filled 2024-04-24 (×4): qty 100

## 2024-04-24 MED ORDER — CEFAZOLIN SODIUM-DEXTROSE 1-4 GM/50ML-% IV SOLN
1.0000 g | Freq: Two times a day (BID) | INTRAVENOUS | Status: DC
Start: 2024-04-24 — End: 2024-04-24
  Filled 2024-04-24: qty 50

## 2024-04-24 MED ORDER — LACTATED RINGERS IV BOLUS
1000.0000 mL | Freq: Once | INTRAVENOUS | Status: AC
  Administered 2024-04-24: 1000 mL via INTRAVENOUS

## 2024-04-24 MED ORDER — ENOXAPARIN SODIUM 30 MG/0.3ML IJ SOSY
30.0000 mg | PREFILLED_SYRINGE | INTRAMUSCULAR | Status: DC
  Administered 2024-04-24: 30 mg via SUBCUTANEOUS
  Filled 2024-04-24: qty 0.3

## 2024-04-24 MED ORDER — ONDANSETRON HCL 4 MG/2ML IJ SOLN
4.0000 mg | Freq: Four times a day (QID) | INTRAMUSCULAR | Status: DC | PRN
  Administered 2024-04-25: 4 mg via INTRAVENOUS

## 2024-04-24 MED ORDER — SODIUM CHLORIDE 0.9% FLUSH
10.0000 mL | Freq: Two times a day (BID) | INTRAVENOUS | Status: DC
  Administered 2024-04-25 – 2024-04-27 (×4): 10 mL

## 2024-04-24 MED ORDER — ONDANSETRON HCL 4 MG PO TABS: 4.0000 mg | ORAL_TABLET | Freq: Four times a day (QID) | ORAL | Status: DC | PRN

## 2024-04-24 MED ORDER — HYDROCHLOROTHIAZIDE 12.5 MG PO TABS
12.5000 mg | ORAL_TABLET | Freq: Every day | ORAL | Status: DC
  Administered 2024-04-25 – 2024-04-27 (×3): 12.5 mg via ORAL
  Filled 2024-04-24 (×3): qty 1

## 2024-04-24 MED ORDER — ACETAMINOPHEN 650 MG RE SUPP: 650.0000 mg | Freq: Four times a day (QID) | RECTAL | Status: DC | PRN

## 2024-04-24 MED ORDER — ACETAMINOPHEN 325 MG PO TABS: 650.0000 mg | ORAL_TABLET | Freq: Four times a day (QID) | ORAL | Status: DC | PRN

## 2024-04-24 MED ORDER — INSULIN ASPART 100 UNIT/ML IJ SOLN: 0.0000 [IU] | Freq: Three times a day (TID) | INTRAMUSCULAR | Status: DC

## 2024-04-24 MED ORDER — CHLORHEXIDINE GLUCONATE CLOTH 2 % EX PADS
6.0000 | MEDICATED_PAD | Freq: Every day | CUTANEOUS | Status: DC
  Administered 2024-04-25 – 2024-04-27 (×3): 6 via TOPICAL

## 2024-04-24 MED ORDER — SODIUM CHLORIDE 0.9% FLUSH: 10.0000 mL | INTRAVENOUS | Status: DC | PRN

## 2024-04-24 MED ORDER — OXYCODONE HCL 5 MG PO TABS: 5.0000 mg | ORAL_TABLET | ORAL | Status: DC | PRN

## 2024-04-24 MED ORDER — POLYETHYLENE GLYCOL 3350 17 G PO PACK: 17.0000 g | PACK | Freq: Every day | ORAL | Status: DC | PRN

## 2024-04-24 NOTE — Progress Notes (Signed)
 PHARMACIST - PHYSICIAN COMMUNICATION  CONCERNING:  Enoxaparin  (Lovenox ) for DVT Prophylaxis   ASSESSMENT: Patient was prescribed enoxaparin  40 mg subcutaneously every 24 hours for VTE prophylaxis.   Body mass index is 26.34 kg/m.  Estimated Creatinine Clearance: 29.3 mL/min (A) (by C-G formula based on SCr of 1.36 mg/dL (H)).  Based on [facility] policy, patient qualifies for enoxaparin  dosing of 30 mg every 24 hours because their creatinine clearance is <30 mL/min.  PLAN: Pharmacy has adjusted enoxaparin  dose per [facility] policy.  Description: Patient is now receiving enoxaparin  30 mg subcutaneously every 24 hours.  Will M. Lenon, PharmD, BCPS Clinical Pharmacist 04/24/2024 4:20 PM

## 2024-04-24 NOTE — ED Triage Notes (Signed)
 Patient to ED via POV from podiatry to get left foot cleaned out. Boot in place. Has been receiving IV antibiotics in PICC line.

## 2024-04-24 NOTE — Consult Note (Signed)
 NAME: Joann [name]  DOB: 07-29-1945  MRN: [id number]  Date/Time: 04/24/2024 9:59 PM  REQUESTING PROVIDER: Dr.Paudel Subjective:  REASON FOR CONSULT: Left foot wound- MSSA infection  ? Joann [name] is a 78 y.o. with a history of diabetes mellitus, amputation of right great toe, fifth left metatarsal amputation, hypertension Was recently in the hospital between 04/08/2024 until 04/12/2024 for Staph aureus bacteremia, left foot wound for which she underwent I&D and she was sent home on IV cefazolin  for total of 4 weeks.  She had gone for a routine visit to see her podiatrist and she did the x-ray and that showed some air in the tissue and also a piece of bone had fallen out while she was cleaning the foot and hence she was hospitalized for further debridement Patient has no fever or chills Her husband is giving her the IV cefazolin  at home 3 times a day and she is 100% adherent to it Vitals in the ED  04/24/24 11:45  BP 130/62  Temp 98 F (36.7 C)  Pulse Rate 73  Resp 17  SpO2 99 %     Latest Reference Range & Units 04/24/24 11:47  WBC 4.0 - 10.5 K/uL 8.2  Hemoglobin 12.0 - 15.0 g/dL 88.8 (L)  HCT 63.9 - 53.9 % 34.0 (L)  Platelets 150 - 400 K/uL 279  Creatinine 0.44 - 1.00 mg/dL 8.63 (H)      Past Surgical History:  Procedure Laterality Date   AMPUTATION Left 02/23/2019   Procedure: AMPUTATION LEFT 2nd Toe;  Surgeon: Ashley Soulier, DPM;  Location: [facility];  Service: Podiatry;  Laterality: Left;   AMPUTATION TOE Right 10/26/2020   Procedure: right great toe amputation;  Surgeon: Ashley Soulier, DPM;  Location: [facility];  Service: Podiatry;  Laterality: Right;   BONE EXCISION Left 05/02/2020   Procedure: BONE EXCISION;  Surgeon: Ashley Soulier, DPM;  Location: [facility];  Service: Podiatry;  Laterality: Left;   BREAST BIOPSY Right 2007   positive.  Lumpectomy with rad tx   BREAST LUMPECTOMY Right 2007   [facility] APPLICATION Left 05/02/2020   Procedure: ADJACENT SKIN  TRANSFER;  Surgeon: Ashley Soulier, DPM;  Location: [facility];  Service: Podiatry;  Laterality: Left;   INCISION AND DRAINAGE Left 10/16/2016   Procedure: INCISION AND DRAINAGE;  Surgeon: Donnice Cory, DPM;  Location: [facility];  Service: Podiatry;  Laterality: Left;   INCISION AND DRAINAGE OF WOUND Left 04/09/2024   Procedure: IRRIGATION AND DEBRIDEMENT WOUND;  Surgeon: Tanda Greig MATSU, DPM;  Location: [facility];  Service: Podiatry;  Laterality: Left;  04/08/24 Can do from anywhere from 12:20 to 1pm or anytime 4:30 onward.   IRRIGATION AND DEBRIDEMENT FOOT Left 03/02/2019   Procedure: IRRIGATION AND DEBRIDEMENT LEFT FOOT SECOND METATARSAL, DIABETIC;  Surgeon: Ashley Soulier, DPM;  Location: [facility];  Service: Podiatry;  Laterality: Left;   METATARSAL HEAD EXCISION Left 05/02/2020   Procedure: METATARSAL HEAD EXCISION 5th;  Surgeon: Ashley Soulier, DPM;  Location: [facility];  Service: Podiatry;  Laterality: Left;    Social History   Socioeconomic History   Marital status: Married    Spouse name: Not on file   Number of children: Not on file   Years of education: Not on file   Highest education level: Not on file  Occupational History   Not on file  Tobacco Use   Smoking status: Never   Smokeless tobacco: Never  Vaping Use   Vaping status: Never Used  Substance and Sexual Activity  Alcohol use: No   Drug use: No   Sexual activity: Not on file  Other Topics Concern   Not on file  Social History Narrative   Not on file   Social Drivers of Health   Financial Resource Strain: Low Risk  (03/07/2024)   Received from [facility]   Overall Financial Resource Strain (CARDIA)    Difficulty of Paying Living Expenses: Not hard at all  Food Insecurity: No Food Insecurity (04/24/2024)   Hunger Vital Sign    Worried About Running Out of Food in the Last Year: Never true    Ran Out of Food in the Last Year: Never true  Transportation Needs: No Transportation Needs (04/24/2024)    PRAPARE - Administrator, Civil Service (Medical): No    Lack of Transportation (Non-Medical): No  Physical Activity: Not on file  Stress: Not on file  Social Connections: Moderately Integrated (04/24/2024)   Social Connection and Isolation Panel    Frequency of Communication with Friends and Family: More than three times a week    Frequency of Social Gatherings with Friends and Family: Never    Attends Religious Services: More than 4 times per year    Active Member of [employer] or Organizations: No    Attends Banker Meetings: Never    Marital Status: Married  Catering manager Violence: Not At Risk (04/24/2024)   Humiliation, Afraid, Rape, and Kick questionnaire    Fear of Current or Ex-Partner: No    Emotionally Abused: No    Physically Abused: No    Sexually Abused: No    Family History  Problem Relation Age of Onset   Dementia Mother    CAD Mother    CAD Father    Deep vein thrombosis Father    Breast cancer Neg Hx    Allergies  Allergen Reactions   Neosporin [Neomycin-Bacitracin Zn-Polymyx] Itching   Penicillins Itching    Has patient had a PCN reaction causing immediate rash, facial/tongue/throat swelling, SOB or lightheadedness with hypotension: no Has patient had a PCN reaction causing severe rash involving mucus membranes or skin necrosis: no Has patient had a PCN reaction that required hospitalization no Has patient had a PCN reaction occurring within the last 10 years:no If all of the above answers are NO, then may proceed with Cephalosporin use.    Statins     hallucinations   I? Current Facility-Administered Medications  Medication Dose Route Frequency Provider Last Rate Last Admin   acetaminophen  (TYLENOL ) tablet 650 mg  650 mg Oral Q6H PRN Paudel, Keshab, MD       Or   acetaminophen  (TYLENOL ) suppository 650 mg  650 mg Rectal Q6H PRN Roann Gouty, MD       ceFAZolin  (ANCEF ) IVPB 2g/100 mL premix  2 g Intravenous Q12H Lenon Elsie HERO, [facility]   Stopped at 04/24/24 1708   enoxaparin  (LOVENOX ) injection 30 mg  30 mg Subcutaneous Q24H Paudel, Gouty, MD   30 mg at 04/24/24 2150   [START ON 04/25/2024] hydrochlorothiazide  (HYDRODIURIL ) tablet 12.5 mg  12.5 mg Oral Daily Paudel, Gouty, MD       insulin  aspart (novoLOG ) injection 0-5 Units  0-5 Units Subcutaneous QHS Paudel, Gouty, MD       insulin  aspart (novoLOG ) injection 0-9 Units  0-9 Units Subcutaneous TID WC Paudel, Keshab, MD       ondansetron  (ZOFRAN ) tablet 4 mg  4 mg Oral Q6H PRN Roann Gouty, MD  Or   ondansetron  (ZOFRAN ) injection 4 mg  4 mg Intravenous Q6H PRN Roann Gouty, MD       oxyCODONE  (Oxy IR/ROXICODONE ) immediate release tablet 5 mg  5 mg Oral Q4H PRN Paudel, Keshab, MD       polyethylene glycol (MIRALAX  / GLYCOLAX ) packet 17 g  17 g Oral Daily PRN Paudel, Keshab, MD         Abtx:  Anti-infectives (From admission, onward)    Start     Dose/Rate Route Frequency Ordered Stop   04/24/24 1700  ceFAZolin  (ANCEF ) IVPB 1 g/50 mL premix  Status:  Discontinued        1 g 100 mL/hr over 30 Minutes Intravenous Every 12 hours 04/24/24 1614 04/24/24 1623   04/24/24 1700  ceFAZolin  (ANCEF ) IVPB 2g/100 mL premix        2 g 200 mL/hr over 30 Minutes Intravenous Every 12 hours 04/24/24 1623         REVIEW OF SYSTEMS:  Const: negative fever, negative chills,  weight loss due to extraction of all teeth Eyes: negative diplopia or visual changes, negative eye pain ENT: negative coryza, negative sore throat Resp: negative cough, hemoptysis, dyspnea Cards: negative for chest pain, palpitations, lower extremity edema GU: negative for frequency, dysuria and hematuria GI: Negative for abdominal pain, diarrhea, bleeding, constipation Skin: negative for rash and pruritus Heme: negative for easy bruising and gum/nose bleeding MS: Weakness Neurolo:negative for headaches, dizziness, vertigo, memory problems  Psych: negative for feelings of anxiety,  depression  Endocrine diabetes Allergy/Immunology-PCN itching Objective:  VITALS:  BP (!) 128/90 (BP Location: Left Arm)   Pulse 79   Temp 98.8 F (37.1 C)   Resp 20   Ht 5' 2 (1.575 m)   Wt 65.3 kg   SpO2 95%   BMI 26.34 kg/m  LDA Right-sided PICC line PHYSICAL EXAM:  General: Alert, cooperative, no distress, appears stated age.  Head: Normocephalic, without obvious abnormality, atraumatic. Eyes: Conjunctivae clear, anicteric sclerae. Pupils are equal ENT Nares normal. No drainage or sinus tenderness. Edentulous Neck: Supple, symmetrical, no adenopathy, thyroid: non tender no carotid bruit and no JVD. Back: No CVA tenderness. Lungs: Clear to auscultation bilaterally. No Wheezing or Rhonchi. No rales. Heart: Regular rate and rhythm, no murmur, rub or gallop. Abdomen: Soft, non-tender,not distended. Bowel sounds normal. No masses Extremities: Left foot Plantar aspect on the lateral margin there is an ulcer which has a clean base      skin: No rashes or lesions. Or bruising Lymph: Cervical, supraclavicular normal. Neurologic: Grossly non-focal Pertinent Labs Lab Results CBC    Component Value Date/Time   WBC 5.9 04/24/2024 1730   RBC 3.54 (L) 04/24/2024 1730   HGB 10.3 (L) 04/24/2024 1730   HGB 14.9 10/02/2013 1336   HCT 31.3 (L) 04/24/2024 1730   HCT 44.0 10/02/2013 1336   PLT 226 04/24/2024 1730   PLT 316 10/02/2013 1336   MCV 88.4 04/24/2024 1730   MCV 88 10/02/2013 1336   MCH 29.1 04/24/2024 1730   MCHC 32.9 04/24/2024 1730   RDW 15.6 (H) 04/24/2024 1730   RDW 13.7 10/02/2013 1336   LYMPHSABS 1.4 04/24/2024 1147   MONOABS 0.7 04/24/2024 1147   EOSABS 0.2 04/24/2024 1147   BASOSABS 0.1 04/24/2024 1147       Latest Ref Rng & Units 04/24/2024    5:30 PM 04/24/2024   11:47 AM 04/12/2024    5:42 AM  CMP  Glucose 70 - 99 mg/dL  92  135   BUN 8 - 23 mg/dL  28  22   Creatinine 9.55 - 1.00 mg/dL 8.58  8.63  9.10   Sodium 135 - 145 mmol/L  138  136    Potassium 3.5 - 5.1 mmol/L  4.3  4.0   Chloride 98 - 111 mmol/L  102  105   CO2 22 - 32 mmol/L  22  21   Calcium  8.9 - 10.3 mg/dL  9.6  8.4   Total Protein 6.5 - 8.1 g/dL  6.7    Total Bilirubin 0.0 - 1.2 mg/dL  0.8    Alkaline Phos 38 - 126 U/L  116    AST 15 - 41 U/L  58    ALT 0 - 44 U/L  10        Microbiology: No results found for this or any previous visit (from the past 240 hours).  IMAGING RESULTS: None this admission ? Impression/Recommendation Recent MSSA bacteremia secondary to left foot infection Left foot abscess with necrosis at the site of the callus on the left.  Was debrided during last admission.  MSSA and Proteus and culture.  The cefazolin  susceptibility was intermediate for the Proteus Patient was on cefazolin  Repeat blood culture sent on 04/10/2024 was negative 2D echo was very good with no vegetation and cardiology did not think a TEE was needed at that time The plan was to do 4 weeks of IV cefazolin  until 05/07/2024 but now she may need more than that  Patient went for a routine visit to see her podiatrist and she has been admitted because of bone piece falling off and so she is going to need more debridement She will send culture from the OR Will adjust the duration and the antibiotics accordingly  CKD stable   Hypertension on hydrochlorothiazide   Discussed the management with the patient and her husband Discussed the management with the hospitalist and the podiatrist  ?  ________________________________________________  Note:  This document was prepared using Dragon voice recognition software and may include unintentional dictation errors.

## 2024-04-24 NOTE — ED Provider Notes (Signed)
 Joann [name] Provider Note    Event Date/Time   First MD Initiated Contact with Patient 04/24/24 1151     (approximate)   History   Wound Infection   HPI  Joann [name] is a 78 y.o. female with history of diabetes, hypertension, osteomyelitis of left foot, presenting with left heel wound, was sent in by podiatry for admission for surgery.  She denies any fever, no new weakness or numbness, states that she has a PICC in place and getting her antibiotics daily.  Her podiatrist was concerned that the infection is not improving and will need a deeper washout.    On independent chart review, patient was admitted in mid September for TIA, also noted to have sepsis with osteomyelitis.  Had an I&D done to her left heel wound and discharge with postop shoe, is supposed to be on IV cefazolin  for 4 weeks.  Was seen by podiatry today and sent in for admission with plans for surgery.   Physical Exam   Triage Vital Signs: ED Triage Vitals [04/24/24 1145]  Encounter Vitals Group     BP 130/62     Girls Systolic BP Percentile      Girls Diastolic BP Percentile      Boys Systolic BP Percentile      Boys Diastolic BP Percentile      Pulse Rate 73     Resp 17     Temp 98 F (36.7 C)     Temp Source Oral     SpO2 99 %     Weight 144 lb (65.3 kg)     Height 5' 2 (1.575 m)     Head Circumference      Peak Flow      Pain Score 0     Pain Loc      Pain Education      Exclude from Growth Chart     Most recent vital signs: Vitals:   04/24/24 1145  BP: 130/62  Pulse: 73  Resp: 17  Temp: 98 F (36.7 C)  SpO2: 99%     General: Awake, no distress. CV:  Good peripheral perfusion.  Resp:  Normal effort.  Abd:  No distention.  Other:  No new numbness or weakness, her left heel ulceration is deep, there is surrounding erythema, there is packing in place that I did not remove.   ED Results / Procedures / Treatments   Labs (all labs ordered are  listed, but only abnormal results are displayed) Labs Reviewed  COMPREHENSIVE METABOLIC PANEL WITH GFR - Abnormal; Notable for the following components:      Result Value   BUN 28 (*)    Creatinine, Ser 1.36 (*)    Albumin 2.8 (*)    AST 58 (*)    GFR, Estimated 39 (*)    All other components within normal limits  CBC WITH DIFFERENTIAL/PLATELET - Abnormal; Notable for the following components:   RBC 3.78 (*)    Hemoglobin 11.1 (*)    HCT 34.0 (*)    RDW 15.7 (*)    All other components within normal limits     PROCEDURES:  Critical Care performed: No  Procedures   MEDICATIONS ORDERED IN ED: Medications  lactated ringers  bolus 1,000 mL (has no administration in time range)     IMPRESSION / MDM / ASSESSMENT AND PLAN / ED COURSE  I reviewed the triage vital signs and the nursing notes.  Differential diagnosis includes, but is not limited to, worsening osteomyelitis, cellulitis, nonhealing ulcer.  Will get labs and plan to admit.  Patient's presentation is most consistent with acute presentation with potential threat to life or bodily function.  Independent interpretation of labs below.  Consulted hospitalist was agreeable with the plan for admission and will evaluate the patient.  She is admitted.    Clinical Course as of 04/24/24 1228  Tue Apr 24, 2024  1227 Independent review of labs, no leukocytosis, electrolytes not severely deranged, is an AKI.  Give her some fluids. [TT]    Clinical Course User Index [TT] Waymond, Lorelle Cummins, MD     FINAL CLINICAL IMPRESSION(S) / ED DIAGNOSES   Final diagnoses:  Wound infection  Osteomyelitis of left foot, unspecified type (HCC)     Rx / DC Orders   ED Discharge Orders     None        Note:  This document was prepared using Dragon voice recognition software and may include unintentional dictation errors.    Waymond Lorelle Cummins, MD 04/24/24 [phone]

## 2024-04-24 NOTE — Consult Note (Signed)
 PODIATRIC SURGERY: CONSULT NOTE  Reason for Consult: LEFT foot wound   HPI: Joann [name] is a 78 y.o. female PMH for DM2 c/b neuropathy, MSSA bacteremia due to left foot osteomyelitis (ABX: IV cefazolin  via PICC line), HLD, HTN, TIA, presenting to the ED for evaluation of wound/ infection concern to the LEFT FOOT. Patient known to me after recent admission 04/08/2024 - 04/12/2024 in which she underwent I&D for small abscess on 04/09/2024 on the plantar lateral aspect of her foot to adjacent immediately adjacent to her prior fifth metatarsal amputation site (no acute osseous erosion noted). The abscess was washed out, no recurrence in purulence and wound base looked healthy.   She was DC with PICC ABX  IV Cefazolin  Q8H x 4 weeks for bacteremia treatment. She did not do dressing changes initially because she did not have supplies on DC. Once she saw [facility] she was able to begin daily packing (given depth of the wound). I also dispensed an additional + BACTRIM 10 days RX given noted erythema in clinic on 04/17/24.   Today 04/24/24 - the erythema had improved but on removal of dressing changes bone fragment fell from wound bed. No purulence noted. On baseline XR imaging obtained today, moderate radiolucency noted to the dorsal and dorsal medial aspect of the fifth metatarsal prior amputation site was noted consistent with abscess formation versus beginning stages of gas gangrene. Attempt at clinic I&D from wound entrance was performed however given depth and location - unable to express any purulence.   In clinic noted to be mildly tachypneic (though reporting no trouble breathing) and relays she had to 'force' breakfast down this AM because she didn't feel well / not hungry. Otherwise VS stable and denied F/C/V.   I recommended ED admission for repeat I&D of deep abscess and likely 5th metatarsal resection revision.   Last PO: AM oatmeal + water    PMHx:  Past Medical History:  Diagnosis Date    Breast cancer (HCC) 2007   right breast lumpectomy with 36 rad tx   Breast mass 11/23/2016   6 o'clock   Diabetes mellitus without complication (HCC)    Hypertension     Surgical Hx:  Past Surgical History:  Procedure Laterality Date   AMPUTATION Left 02/23/2019   Procedure: AMPUTATION LEFT 2nd Toe;  Surgeon: Ashley Soulier, DPM;  Location: [facility];  Service: Podiatry;  Laterality: Left;   AMPUTATION TOE Right 10/26/2020   Procedure: right great toe amputation;  Surgeon: Ashley Soulier, DPM;  Location: [facility];  Service: Podiatry;  Laterality: Right;   BONE EXCISION Left 05/02/2020   Procedure: BONE EXCISION;  Surgeon: Ashley Soulier, DPM;  Location: [facility];  Service: Podiatry;  Laterality: Left;   BREAST BIOPSY Right 2007   positive.  Lumpectomy with rad tx   BREAST LUMPECTOMY Right 2007   [facility] APPLICATION Left 05/02/2020   Procedure: ADJACENT SKIN TRANSFER;  Surgeon: Ashley Soulier, DPM;  Location: [facility];  Service: Podiatry;  Laterality: Left;   INCISION AND DRAINAGE Left 10/16/2016   Procedure: INCISION AND DRAINAGE;  Surgeon: Donnice Cory, DPM;  Location: [facility];  Service: Podiatry;  Laterality: Left;   INCISION AND DRAINAGE OF WOUND Left 04/09/2024   Procedure: IRRIGATION AND DEBRIDEMENT WOUND;  Surgeon: Tanda Greig MATSU, DPM;  Location: [facility];  Service: Podiatry;  Laterality: Left;  04/08/24 Can do from anywhere from 12:20 to 1pm or anytime 4:30 onward.   IRRIGATION AND DEBRIDEMENT FOOT Left 03/02/2019   Procedure: IRRIGATION AND  DEBRIDEMENT LEFT FOOT SECOND METATARSAL, DIABETIC;  Surgeon: Ashley Soulier, DPM;  Location: [facility];  Service: Podiatry;  Laterality: Left;   METATARSAL HEAD EXCISION Left 05/02/2020   Procedure: METATARSAL HEAD EXCISION 5th;  Surgeon: Ashley Soulier, DPM;  Location: [facility];  Service: Podiatry;  Laterality: Left;    FHx:  Family History  Problem Relation Age of Onset   Dementia Mother    CAD Mother    CAD Father    Deep vein thrombosis  Father    Breast cancer Neg Hx     Social History:  reports that she has never smoked. She has never used smokeless tobacco. She reports that she does not drink alcohol and does not use drugs.  Allergies:  Allergies  Allergen Reactions   Neosporin [Neomycin-Bacitracin Zn-Polymyx] Itching   Penicillins Itching    Has patient had a PCN reaction causing immediate rash, facial/tongue/throat swelling, SOB or lightheadedness with hypotension: no Has patient had a PCN reaction causing severe rash involving mucus membranes or skin necrosis: no Has patient had a PCN reaction that required hospitalization no Has patient had a PCN reaction occurring within the last 10 years:no If all of the above answers are NO, then may proceed with Cephalosporin use.    Statins     hallucinations    Medications Prior to Admission  Medication Sig Dispense Refill   ascorbic acid  (VITAMIN C ) 500 MG tablet Take 1 tablet (500 mg total) by mouth 2 (two) times daily. 30 tablet 0   aspirin  EC 81 MG tablet Take 1 tablet (81 mg total) by mouth daily as needed for up to 21 days (headaches). 21 tablet 0   Calcium  Citrate-Vitamin D (CALCIUM  + D PO) Take 1 tablet by mouth daily.     ceFAZolin  (ANCEF ) IVPB Inject 2 g into the vein every 8 (eight) hours for 25 days. Indication:  MSSA bacteremia and foot infection First Dose: Yes Last Day of Therapy:  05/07/2024 Labs - Once weekly:  CBC/D, CMP, ESR and CRP Fax weekly lab results  promptly to [phone] Method of administration: IV Push Method of administration may be changed at the discretion of home infusion pharmacist based upon assessment of the patient and/or caregiver's ability to self-administer the medication ordered. Please pull PIC at completion of IV antibiotics Call [phone] with any questions or critical value 75 Units 0   clopidogrel  (PLAVIX ) 75 MG tablet Take 1 tablet (75 mg total) by mouth daily. 30 tablet 2   hydrochlorothiazide  (HYDRODIURIL ) 12.5  MG tablet Take 12.5 mg by mouth daily.     metFORMIN  (GLUCOPHAGE ) 500 MG tablet Take 500 mg by mouth 2 (two) times daily.  1   Multiple Vitamin (MULTIVITAMIN WITH MINERALS) TABS tablet Take 1 tablet by mouth daily. 30 tablet 1   omega-3 acid ethyl esters (LOVAZA ) 1 g capsule Take 1 capsule (1 g total) by mouth daily. 30 capsule 0   sulfamethoxazole-trimethoprim (BACTRIM DS) 800-160 MG tablet Take 1 tablet by mouth 2 (two) times daily.     zinc  sulfate, 50mg  elemental zinc , 220 (50 Zn) MG capsule Take 1 capsule (220 mg total) by mouth daily. 30 capsule 0   feeding supplement (ENSURE PLUS HIGH PROTEIN) LIQD Take 237 mLs by mouth 3 (three) times daily between meals. 237 mL 0    Physical Exam: Blood pressure (!) 128/90, pulse 79, temperature 98.8 F (37.1 C), resp. rate 20, height 5' 2 (1.575 m), weight 65.3 kg, SpO2 95%.  Constitutional: Patient appears of normal development  and good nutritional status for stated age, well-groomed, and of normal body habitus. Phonating appropriately.   Psychiatric: Alert and oriented to time, place, person and situation. The patient is noted to have good judgment and insight into the hospital visit  FOCUSED LOWER EXTREMITY EXAMINATION:    Neurological:  - Protective sensation diminished / absent - Gross protective sensation diminished bilaterally.  - No focal motor or sensory deficits identified bilaterally.    Vascular:  - Dorsalis Pedis artery on palpation: diminished - 1 - Posterior Tibial artery on palpation:  diminished - 1 - Capillary Filling Times: sluggish  - Peripheral edema: 1+ - Dependency Rubor: yes  - Varicosities: mod     Musculoskeletal:  s/p 2nd and 5th partial ray resection / amputation LEFT foot Muscle strength: 5/5 in all 4 quadrants  Ankle Joint ROM: decreased, equinus noted Global foot - TTP: none   Dermatological:  - Skin quality: atrophic  - Interdigital spaces: c/d/i    #WOUND 1  Type: Full thickness neuropathic  ulceration Location: plantar lateral wound - at aspect of where 5th met head would reside.  --- Wound base: 80% Granular ; 20% Fibrosis ; 10% Necrotic  Adjacent tissue/ Wound borders: macerated/ rolled Undermining noted (Pre-debridement): yes; Direction: circumferentially with biggest area of tracking dorsally / laterally.  PTB: + yes  Malodor: no Active Drainage: no    Measurements: 2cm (l) x 2cm (w) x bone cm (2cm) (d)  Results for orders placed or performed during the hospital encounter of 04/24/24 (from the past 48 hours)  Comprehensive metabolic panel     Status: Abnormal   Collection Time: 04/24/24 11:47 AM  Result Value Ref Range   Sodium 138 135 - 145 mmol/L   Potassium 4.3 3.5 - 5.1 mmol/L   Chloride 102 98 - 111 mmol/L   CO2 22 22 - 32 mmol/L   Glucose, Bld 92 70 - 99 mg/dL    Comment: Glucose reference range applies only to samples taken after fasting for at least 8 hours.   BUN 28 (H) 8 - 23 mg/dL   Creatinine, Ser 8.63 (H) 0.44 - 1.00 mg/dL   Calcium  9.6 8.9 - 10.3 mg/dL   Total Protein 6.7 6.5 - 8.1 g/dL   Albumin 2.8 (L) 3.5 - 5.0 g/dL   AST 58 (H) 15 - 41 U/L   ALT 10 0 - 44 U/L   Alkaline Phosphatase 116 38 - 126 U/L   Total Bilirubin 0.8 0.0 - 1.2 mg/dL   GFR, Estimated 39 (L) >60 mL/min    Comment: (NOTE) Calculated using the CKD-EPI Creatinine Equation (2021)    Anion gap 14 5 - 15    Comment: Performed at [facility], [address] [place], [place] [address]  CBC with Differential     Status: Abnormal   Collection Time: 04/24/24 11:47 AM  Result Value Ref Range   WBC 8.2 4.0 - 10.5 K/uL   RBC 3.78 (L) 3.87 - 5.11 MIL/uL   Hemoglobin 11.1 (L) 12.0 - 15.0 g/dL   HCT 65.9 (L) 63.9 - 53.9 %   MCV 89.9 80.0 - 100.0 fL   MCH 29.4 26.0 - 34.0 pg   MCHC 32.6 30.0 - 36.0 g/dL   RDW 84.2 (H) 88.4 - 84.4 %   Platelets 279 150 - 400 K/uL   nRBC 0.0 0.0 - 0.2 %   Neutrophils Relative % 70 %   Neutro Abs 5.7 1.7 - 7.7 K/uL   Lymphocytes Relative  17 %  Lymphs Abs 1.4 0.7 - 4.0 K/uL   Monocytes Relative 9 %   Monocytes Absolute 0.7 0.1 - 1.0 K/uL   Eosinophils Relative 2 %   Eosinophils Absolute 0.2 0.0 - 0.5 K/uL   Basophils Relative 2 %   Basophils Absolute 0.1 0.0 - 0.1 K/uL   Immature Granulocytes 0 %   Abs Immature Granulocytes 0.02 0.00 - 0.07 K/uL    Comment: Performed at [facility], [address] [place], [place] [address]  CBG monitoring, ED     Status: None   Collection Time: 04/24/24  5:28 PM  Result Value Ref Range   Glucose-Capillary 97 70 - 99 mg/dL    Comment: Glucose reference range applies only to samples taken after fasting for at least 8 hours.  CBC     Status: Abnormal   Collection Time: 04/24/24  5:30 PM  Result Value Ref Range   WBC 5.9 4.0 - 10.5 K/uL   RBC 3.54 (L) 3.87 - 5.11 MIL/uL   Hemoglobin 10.3 (L) 12.0 - 15.0 g/dL   HCT 68.6 (L) 63.9 - 53.9 %   MCV 88.4 80.0 - 100.0 fL   MCH 29.1 26.0 - 34.0 pg   MCHC 32.9 30.0 - 36.0 g/dL   RDW 84.3 (H) 88.4 - 84.4 %   Platelets 226 150 - 400 K/uL   nRBC 0.0 0.0 - 0.2 %    Comment: Performed at [facility], [address] [place], [place] [address]  Creatinine, serum     Status: Abnormal   Collection Time: 04/24/24  5:30 PM  Result Value Ref Range   Creatinine, Ser 1.41 (H) 0.44 - 1.00 mg/dL   GFR, Estimated 38 (L) >60 mL/min    Comment: (NOTE) Calculated using the CKD-EPI Creatinine Equation (2021) Performed at [facility], [address] [place], [place] [address]   Sedimentation rate     Status: Abnormal   Collection Time: 04/24/24  5:30 PM  Result Value Ref Range   Sed Rate 34 (H) 0 - 30 mm/hr    Comment: Performed at [facility], [address], [place], [place] [address]   No results found.   CLINIC Imaging: 04/24/24 3 Views taken and reviewed of the Left FOOT : AP, Lat, Oblique   Soft Tissue Density: WNL Bone Density: diffuse osteopenia noted Fracture: None Two large/ moderate areas  of focal radiolucency in the soft tissue adjacent to the fifth metatarsal partial amputation site - in dorsomedial and dorsal proximal orientation. Finding consistent with focal abscess versus early stage gas in soft tissues. No sinus tract ID.  This impression was discussed/ shared with the patient.   Assessment  DIABETIC FOOT INFECTION, LEFT FOOT  ABSCESS, LEFT FOOT   I suspect patient is systemically stable 2/2 current dual ABX coverage (IV cefazolin  + PO Bactrim) - however deeper seated infection noted on clinic XR indicates breakthrough infection or early stages of gas. Given patient HIGH risk for limb loss, it was recommended she present for acute admission with likely wash out + partial 5th ray amputation revision tomorrow 04/24/2024.  Discussed r/b/a to treatment options for acute abscess - I answered all patient questions to the best of my ability. Patient opted to proceed with surgical intervention as noted above. I will place orders for this process to begin. Patient is aware and agreeable.      Plan - Case Request placed 04/24/24 - ABX: Appreciate Medicine / ID / Pharmacy assist with antibiotic stewardship and  appropriate coverage.  - Diet: NPO at midnight.  - Activity: NWB LEFT lower extremity.   - Wound Care: Podiatry dressed in clinic today 04/24/24. If wound needs to be inspected, please replace with iodoform packing strips / betadine  paint to wound periphery, 4x4 gauze, ABD Kerlix + ACE.   Greig KANDICE Blush 04/24/2024, 8:42 PM

## 2024-04-24 NOTE — H&P (Signed)
 History and Physical    Lia Vigilante FMW:[id number] DOB: [birth year] DOA: 04/24/2024  DOS: the patient was seen and examined on 04/24/2024  PCP: Rudolpho Norleen BIRCH, MD   Patient coming from: Home  I have personally briefly reviewed patient's old medical records in [facility]  Chief Complaint: Left foot infection  HPI: Allanah Mcfarland is a pleasant 78 y.o. female with medical history significant for MSSA bacteremia due to left foot osteomyelitis on IV cefazolin  via PICC line with original EOT was 05/07/2024, type 2 diabetes, hyperlipidemia, HTN, TIA, left foot plantar ulcer being followed by podiatrist who was sent over to [facility] ED for left foot wound not getting better with outpatient treatment requiring surgical procedure/washout.  Patient denies any fever, chills, nausea, vomiting, weakness, numbness, chest pain, shortness of breath.  ED Course: Upon arrival to the ED, patient is found to have left foot wound infection on the plantar aspect.  Hospitalist service was consulted for evaluation for admission.  Review of Systems:  ROS  All other systems negative except as noted in the HPI.  Past Medical History:  Diagnosis Date   Breast cancer (HCC) 2007   right breast lumpectomy with 36 rad tx   Breast mass 11/23/2016   6 o'clock   Diabetes mellitus without complication (HCC)    Hypertension     Past Surgical History:  Procedure Laterality Date   AMPUTATION Left 02/23/2019   Procedure: AMPUTATION LEFT 2nd Toe;  Surgeon: Ashley Soulier, DPM;  Location: [facility] ORS;  Service: Podiatry;  Laterality: Left;   AMPUTATION TOE Right 10/26/2020   Procedure: right great toe amputation;  Surgeon: Ashley Soulier, DPM;  Location: [facility] ORS;  Service: Podiatry;  Laterality: Right;   BONE EXCISION Left 05/02/2020   Procedure: BONE EXCISION;  Surgeon: Ashley Soulier, DPM;  Location: [facility] ORS;  Service: Podiatry;  Laterality: Left;   BREAST BIOPSY Right 2007   positive.  Lumpectomy  with rad tx   BREAST LUMPECTOMY Right 2007   [facility] APPLICATION Left 05/02/2020   Procedure: ADJACENT SKIN TRANSFER;  Surgeon: Ashley Soulier, DPM;  Location: [facility] ORS;  Service: Podiatry;  Laterality: Left;   INCISION AND DRAINAGE Left 10/16/2016   Procedure: INCISION AND DRAINAGE;  Surgeon: Donnice Cory, DPM;  Location: [facility] ORS;  Service: Podiatry;  Laterality: Left;   INCISION AND DRAINAGE OF WOUND Left 04/09/2024   Procedure: IRRIGATION AND DEBRIDEMENT WOUND;  Surgeon: Tanda Greig MATSU, DPM;  Location: [facility] ORS;  Service: Podiatry;  Laterality: Left;  04/08/24 Can do from anywhere from 12:20 to 1pm or anytime 4:30 onward.   IRRIGATION AND DEBRIDEMENT FOOT Left 03/02/2019   Procedure: IRRIGATION AND DEBRIDEMENT LEFT FOOT SECOND METATARSAL, DIABETIC;  Surgeon: Ashley Soulier, DPM;  Location: [facility] ORS;  Service: Podiatry;  Laterality: Left;   METATARSAL HEAD EXCISION Left 05/02/2020   Procedure: METATARSAL HEAD EXCISION 5th;  Surgeon: Ashley Soulier, DPM;  Location: [facility] ORS;  Service: Podiatry;  Laterality: Left;     reports that she has never smoked. She has never used smokeless tobacco. She reports that she does not drink alcohol and does not use drugs.  Allergies  Allergen Reactions   Neosporin [Neomycin-Bacitracin Zn-Polymyx] Itching   Penicillins Itching    Has patient had a PCN reaction causing immediate rash, facial/tongue/throat swelling, SOB or lightheadedness with hypotension: no Has patient had a PCN reaction causing severe rash involving mucus membranes or skin necrosis: no Has patient had a PCN reaction that required hospitalization no Has patient had a PCN  reaction occurring within the last 10 years:no If all of the above answers are NO, then may proceed with Cephalosporin use.    Statins     hallucinations    Family History  Problem Relation Age of Onset   Dementia Mother    CAD Mother    CAD Father    Deep vein thrombosis Father    Breast cancer Neg Hx      Prior to Admission medications   Medication Sig Start Date End Date Taking? Authorizing Provider  ascorbic acid  (VITAMIN C ) 500 MG tablet Take 1 tablet (500 mg total) by mouth 2 (two) times daily. 04/12/24  Yes Tobie Calix, MD  aspirin  EC 81 MG tablet Take 1 tablet (81 mg total) by mouth daily as needed for up to 21 days (headaches). 04/12/24 05/03/24 Yes Patel, Sona, MD  Calcium  Citrate-Vitamin D (CALCIUM  + D PO) Take 1 tablet by mouth daily.   Yes [provider]  ceFAZolin  (ANCEF ) IVPB Inject 2 g into the vein every 8 (eight) hours for 25 days. Indication:  MSSA bacteremia and foot infection First Dose: Yes Last Day of Therapy:  05/07/2024 Labs - Once weekly:  CBC/D, CMP, ESR and CRP Fax weekly lab results  promptly to [phone] Method of administration: IV Push Method of administration may be changed at the discretion of home infusion pharmacist based upon assessment of the patient and/or caregiver's ability to self-administer the medication ordered. Please pull PIC at completion of IV antibiotics Call [phone] with any questions or critical value 04/12/24 05/07/24 Yes Patel, Sona, MD  clopidogrel  (PLAVIX ) 75 MG tablet Take 1 tablet (75 mg total) by mouth daily. 04/13/24  Yes Patel, Sona, MD  hydrochlorothiazide  (HYDRODIURIL ) 12.5 MG tablet Take 12.5 mg by mouth daily. 02/13/24  Yes [provider]  metFORMIN  (GLUCOPHAGE ) 500 MG tablet Take 500 mg by mouth 2 (two) times daily. 09/30/16  Yes [provider]  Multiple Vitamin (MULTIVITAMIN WITH MINERALS) TABS tablet Take 1 tablet by mouth daily. 04/13/24  Yes Patel, Sona, MD  omega-3 acid ethyl esters (LOVAZA ) 1 g capsule Take 1 capsule (1 g total) by mouth daily. 04/13/24  Yes Patel, Sona, MD  sulfamethoxazole-trimethoprim (BACTRIM DS) 800-160 MG tablet Take 1 tablet by mouth 2 (two) times daily. 04/17/24 04/27/24 Yes [provider]  zinc  sulfate, 50mg  elemental zinc , 220 (50 Zn) MG capsule Take 1 capsule  (220 mg total) by mouth daily. 04/13/24  Yes Patel, Sona, MD  feeding supplement (ENSURE PLUS HIGH PROTEIN) LIQD Take 237 mLs by mouth 3 (three) times daily between meals. 04/12/24   Tobie Calix, MD    Physical Exam: Vitals:   04/24/24 1400 04/24/24 1430 04/24/24 1500 04/24/24 1536  BP: 128/69 127/61 (!) 152/69 136/60  Pulse: 80 70 86 83  Resp:   18 18  Temp:      TempSrc:    Oral  SpO2: 96% 100% 96% 99%  Weight:      Height:        Physical Exam   Constitutional: Alert, awake, calm, comfortable HEENT: Neck supple Respiratory: Clear to auscultation B/L, no wheezing, no rales.  Cardiovascular: Regular rate and rhythm, no murmurs / rubs / gallops. No extremity edema. 2+ pedal pulses. No carotid bruits.  Abdomen: Soft, no tenderness, Bowel sounds positive.  Musculoskeletal: no clubbing / cyanosis. Good ROM, no contractures. Normal muscle tone.  Skin: Left foot plantar aspect has a nonhealing wound. Neurologic: CN 2-12 grossly intact. Sensation intact, No focal deficit identified Psychiatric:  Alert and oriented x 3. Normal mood.    Labs on Admission: I have personally reviewed following labs and imaging studies  CBC: Recent Labs  Lab 04/24/24 1147  WBC 8.2  NEUTROABS 5.7  HGB 11.1*  HCT 34.0*  MCV 89.9  PLT 279   Basic Metabolic Panel: Recent Labs  Lab 04/24/24 1147  NA 138  K 4.3  CL 102  CO2 22  GLUCOSE 92  BUN 28*  CREATININE 1.36*  CALCIUM  9.6   GFR: Estimated Creatinine Clearance: 29.3 mL/min (A) (by C-G formula based on SCr of 1.36 mg/dL (H)). Liver Function Tests: Recent Labs  Lab 04/24/24 1147  AST 58*  ALT 10  ALKPHOS 116  BILITOT 0.8  PROT 6.7  ALBUMIN 2.8*   No results for input(s): LIPASE, AMYLASE in the last 168 hours. No results for input(s): AMMONIA in the last 168 hours. Coagulation Profile: No results for input(s): INR, PROTIME in the last 168 hours. Cardiac Enzymes: No results for input(s): CKTOTAL, CKMB, CKMBINDEX,  TROPONINI, TROPONINIHS in the last 168 hours. BNP (last 3 results) No results for input(s): BNP in the last 8760 hours. HbA1C: No results for input(s): HGBA1C in the last 72 hours. CBG: No results for input(s): GLUCAP in the last 168 hours. Lipid Profile: No results for input(s): CHOL, HDL, LDLCALC, TRIG, CHOLHDL, LDLDIRECT in the last 72 hours. Thyroid Function Tests: No results for input(s): TSH, T4TOTAL, FREET4, T3FREE, THYROIDAB in the last 72 hours. Anemia Panel: No results for input(s): VITAMINB12, FOLATE, FERRITIN, TIBC, IRON, RETICCTPCT in the last 72 hours. Urine analysis:    Component Value Date/Time   COLORURINE YELLOW (A) 04/09/2024 2229   APPEARANCEUR HAZY (A) 04/09/2024 2229   LABSPEC 1.019 04/09/2024 2229   PHURINE 5.0 04/09/2024 2229   GLUCOSEU NEGATIVE 04/09/2024 2229   HGBUR NEGATIVE 04/09/2024 2229   BILIRUBINUR NEGATIVE 04/09/2024 2229   KETONESUR NEGATIVE 04/09/2024 2229   PROTEINUR NEGATIVE 04/09/2024 2229   NITRITE NEGATIVE 04/09/2024 2229   LEUKOCYTESUR NEGATIVE 04/09/2024 2229    Radiological Exams on Admission: I have personally reviewed images No results found.  EKG: My personal interpretation of EKG shows: Sinus rhythm    Assessment/Plan Principal Problem:   Left foot infection Active Problems:   Osteomyelitis of left foot (HCC)   DM2 (diabetes mellitus, type 2) (HCC)   Hypertension    Assessment and Plan: 78 year old with history of diabetes, recent MSSA bacteremia, left foot osteomyelitis on IV cefazolin  via PICC line EOT 05/07/2024, HTN, history of TIA who was being followed by podiatrist at outpatient for left foot wound, did not get better and being admitted for further treatment and washout.  1.  Left foot osteomyelitis/wound failed outpatient treatment - She will be admitted to hospital as inpatient - Patient has a history of MSSA and on active cefazolin , EOT 05/07/2024 - She will be  started on cefazolin  at home dose - Hold aspirin  and Plavix  due to possibility of going to the OR - CRP ESR - ID consult - Podiatry  2.  Diabetes - Patient takes metformin  which will be held - Insulin  sliding scale  3.  HTN - Stable - Continue home dose of hydrochlorothiazide  - Continue to monitor blood pressure       DVT prophylaxis: Lovenox  Code Status: Full Code Family Communication: Husband at bedside Disposition Plan: Home Consults called: Podiatry by EDP, ID Admission status: Inpatient, Telemetry bed   Nena Rebel, MD [facility] 04/24/2024, 4:28 PM

## 2024-04-24 NOTE — Progress Notes (Signed)
 PHARMACY NOTE:  ANTIMICROBIAL RENAL DOSAGE ADJUSTMENT  Current antimicrobial regimen includes a mismatch between antimicrobial dosage and estimated renal function.  As per policy approved by the Pharmacy & Therapeutics and Medical Executive Committees, the antimicrobial dosage will be adjusted accordingly.  Current antimicrobial dosage:  Cefazolin  1g q8H  Indication: MSSA bacteremia  Renal Function:  Estimated Creatinine Clearance: 29.3 mL/min (A) (by C-G formula based on SCr of 1.36 mg/dL (H)).    Antimicrobial dosage has been changed to:  Cefazolin  2g q12H    Thank you for allowing pharmacy to be a part of this patient's care.  Will M. Lenon, PharmD, BCPS Clinical Pharmacist 04/24/2024 4:24 PM

## 2024-04-25 ENCOUNTER — Inpatient Hospital Stay: Admitting: Anesthesiology

## 2024-04-25 ENCOUNTER — Inpatient Hospital Stay

## 2024-04-25 ENCOUNTER — Encounter: Admission: EM | Disposition: A | Discharge status: Home Health | Payer: Self-pay | Source: Home / Self Care

## 2024-04-25 ENCOUNTER — Encounter: Payer: Self-pay | Admitting: Hospitalist

## 2024-04-25 DIAGNOSIS — R7881 Bacteremia: Secondary | ICD-10-CM | POA: Diagnosis not present

## 2024-04-25 DIAGNOSIS — B9561 Methicillin susceptible Staphylococcus aureus infection as the cause of diseases classified elsewhere: Secondary | ICD-10-CM | POA: Diagnosis not present

## 2024-04-25 DIAGNOSIS — L089 Local infection of the skin and subcutaneous tissue, unspecified: Secondary | ICD-10-CM | POA: Diagnosis not present

## 2024-04-25 DIAGNOSIS — B964 Proteus (mirabilis) (morganii) as the cause of diseases classified elsewhere: Secondary | ICD-10-CM | POA: Diagnosis not present

## 2024-04-25 DIAGNOSIS — L02612 Cutaneous abscess of left foot: Secondary | ICD-10-CM | POA: Diagnosis not present

## 2024-04-25 HISTORY — PX: IRRIGATION AND DEBRIDEMENT FOOT: SHX6602

## 2024-04-25 LAB — COMPREHENSIVE METABOLIC PANEL WITH GFR
ALT: 9 U/L (ref 0–44)
AST: 44 U/L — ABNORMAL HIGH (ref 15–41)
Albumin: 2.6 g/dL — ABNORMAL LOW (ref 3.5–5.0)
Alkaline Phosphatase: 106 U/L (ref 38–126)
Anion gap: 6 (ref 5–15)
BUN: 24 mg/dL — ABNORMAL HIGH (ref 8–23)
CO2: 26 mmol/L (ref 22–32)
Calcium: 8.7 mg/dL — ABNORMAL LOW (ref 8.9–10.3)
Chloride: 105 mmol/L (ref 98–111)
Creatinine, Ser: 1.26 mg/dL — ABNORMAL HIGH (ref 0.44–1.00)
GFR, Estimated: 43 mL/min — ABNORMAL LOW (ref >60)
Glucose, Bld: 93 mg/dL (ref 70–99)
Potassium: 4.3 mmol/L (ref 3.5–5.1)
Sodium: 137 mmol/L (ref 135–145)
Total Bilirubin: 0.9 mg/dL (ref 0.0–1.2)
Total Protein: 6 g/dL — ABNORMAL LOW (ref 6.5–8.1)

## 2024-04-25 LAB — GLUCOSE, CAPILLARY
Glucose-Capillary: 92 mg/dL (ref 70–99)
Glucose-Capillary: 92 mg/dL (ref 70–99)
Glucose-Capillary: 94 mg/dL (ref 70–99)
Glucose-Capillary: 78 mg/dL (ref 70–99)
Glucose-Capillary: 89 mg/dL (ref 70–99)

## 2024-04-25 LAB — CBC
HCT: 30.4 % — ABNORMAL LOW (ref 36.0–46.0)
Hemoglobin: 10.2 g/dL — ABNORMAL LOW (ref 12.0–15.0)
MCH: 29.7 pg (ref 26.0–34.0)
MCHC: 33.6 g/dL (ref 30.0–36.0)
MCV: 88.6 fL (ref 80.0–100.0)
Platelets: 230 K/uL (ref 150–400)
RBC: 3.43 MIL/uL — ABNORMAL LOW (ref 3.87–5.11)
RDW: 15.7 % — ABNORMAL HIGH (ref 11.5–15.5)
WBC: 5.1 K/uL (ref 4.0–10.5)
nRBC: 0 % (ref 0.0–0.2)

## 2024-04-25 LAB — PROTIME-INR
INR: 1.2 (ref 0.8–1.2)
Prothrombin Time: 16.1 s — ABNORMAL HIGH (ref 11.4–15.2)

## 2024-04-25 LAB — MRSA NEXT GEN BY PCR, NASAL: MRSA by PCR Next Gen: NOT DETECTED

## 2024-04-25 SURGERY — IRRIGATION AND DEBRIDEMENT FOOT
Anesthesia: General | Laterality: Left

## 2024-04-25 MED ORDER — PHENYLEPHRINE 80 MCG/ML (10ML) SYRINGE FOR IV PUSH (FOR BLOOD PRESSURE SUPPORT)
PREFILLED_SYRINGE | INTRAVENOUS | Status: AC
  Filled 2024-04-25: qty 10

## 2024-04-25 MED ORDER — CLOPIDOGREL BISULFATE 75 MG PO TABS
75.0000 mg | ORAL_TABLET | Freq: Every day | ORAL | Status: DC
  Administered 2024-04-26 – 2024-04-27 (×2): 75 mg via ORAL
  Filled 2024-04-25 (×2): qty 1

## 2024-04-25 MED ORDER — SODIUM CHLORIDE 0.9 % IR SOLN
Status: DC | PRN
  Administered 2024-04-25: 3000 mL

## 2024-04-25 MED ORDER — FENTANYL CITRATE (PF) 100 MCG/2ML IJ SOLN: 25.0000 ug | INTRAMUSCULAR | Status: DC | PRN

## 2024-04-25 MED ORDER — LACTATED RINGERS IV SOLN: INTRAVENOUS | Status: AC

## 2024-04-25 MED ORDER — ACETAMINOPHEN 10 MG/ML IV SOLN
INTRAVENOUS | Status: AC
  Filled 2024-04-25: qty 100

## 2024-04-25 MED ORDER — SODIUM CHLORIDE 0.9 % IV SOLN: INTRAVENOUS | Status: DC | PRN

## 2024-04-25 MED ORDER — LIDOCAINE HCL (PF) 1 % IJ SOLN
INTRAMUSCULAR | Status: AC
  Filled 2024-04-25: qty 30

## 2024-04-25 MED ORDER — PHENYLEPHRINE 80 MCG/ML (10ML) SYRINGE FOR IV PUSH (FOR BLOOD PRESSURE SUPPORT)
PREFILLED_SYRINGE | INTRAVENOUS | Status: DC | PRN
  Administered 2024-04-25: 80 ug via INTRAVENOUS
  Administered 2024-04-25 (×2): 160 ug via INTRAVENOUS
  Administered 2024-04-25: 80 ug via INTRAVENOUS

## 2024-04-25 MED ORDER — SODIUM CHLORIDE 0.9% FLUSH
10.0000 mL | Freq: Two times a day (BID) | INTRAVENOUS | Status: DC
  Administered 2024-04-25 – 2024-04-27 (×6): 10 mL

## 2024-04-25 MED ORDER — FENTANYL CITRATE (PF) 100 MCG/2ML IJ SOLN
INTRAMUSCULAR | Status: AC
  Filled 2024-04-25: qty 2

## 2024-04-25 MED ORDER — SODIUM CHLORIDE 0.9% FLUSH: 10.0000 mL | INTRAVENOUS | Status: DC | PRN

## 2024-04-25 MED ORDER — LIDOCAINE HCL (PF) 2 % IJ SOLN
INTRAMUSCULAR | Status: AC
  Filled 2024-04-25: qty 5

## 2024-04-25 MED ORDER — ACETAMINOPHEN 10 MG/ML IV SOLN
INTRAVENOUS | Status: DC | PRN
Start: 2024-04-25 — End: 2024-04-25
  Administered 2024-04-25: 1000 mg via INTRAVENOUS

## 2024-04-25 MED ORDER — LIDOCAINE HCL (CARDIAC) PF 100 MG/5ML IV SOSY
PREFILLED_SYRINGE | INTRAVENOUS | Status: DC | PRN
  Administered 2024-04-25: 60 mg via INTRAVENOUS

## 2024-04-25 MED ORDER — PROPOFOL 500 MG/50ML IV EMUL
INTRAVENOUS | Status: DC | PRN
  Administered 2024-04-25: 75 ug/kg/min via INTRAVENOUS

## 2024-04-25 MED ORDER — LIDOCAINE HCL (PF) 1 % IJ SOLN
INTRAMUSCULAR | Status: DC | PRN
  Administered 2024-04-25: 7.5 mL

## 2024-04-25 MED ORDER — BUPIVACAINE HCL (PF) 0.5 % IJ SOLN
INTRAMUSCULAR | Status: AC
  Filled 2024-04-25: qty 30

## 2024-04-25 MED ORDER — ONDANSETRON HCL 4 MG/2ML IJ SOLN
INTRAMUSCULAR | Status: AC
  Filled 2024-04-25: qty 2

## 2024-04-25 MED ORDER — DROPERIDOL 2.5 MG/ML IJ SOLN: 0.6250 mg | Freq: Once | INTRAMUSCULAR | Status: DC | PRN

## 2024-04-25 MED ORDER — 0.9 % SODIUM CHLORIDE (POUR BTL) OPTIME
TOPICAL | Status: DC | PRN
  Administered 2024-04-25: 500 mL

## 2024-04-25 MED ORDER — DAKINS (1/4 STRENGTH) 0.125 % EX SOLN
CUTANEOUS | Status: AC
  Filled 2024-04-25: qty 473

## 2024-04-25 MED ORDER — PROPOFOL 10 MG/ML IV BOLUS
INTRAVENOUS | Status: AC
  Filled 2024-04-25: qty 20

## 2024-04-25 MED ORDER — FENTANYL CITRATE (PF) 100 MCG/2ML IJ SOLN
INTRAMUSCULAR | Status: DC | PRN
  Administered 2024-04-25 (×4): 25 ug via INTRAVENOUS

## 2024-04-25 SURGICAL SUPPLY — 40 items
BLADE MED AGGRESSIVE (BLADE) ×1 IMPLANT
BLADE SURG MINI STRL (BLADE) IMPLANT
BLADE SW THK.38XMED LNG THN (BLADE) IMPLANT
BNDG COHESIVE 4X5 TAN STRL LF (GAUZE/BANDAGES/DRESSINGS) ×2 IMPLANT
BNDG ELASTIC 4X5.8 VLCR NS LF (GAUZE/BANDAGES/DRESSINGS) ×2 IMPLANT
BNDG ESMARCH 4X12 STRL LF (GAUZE/BANDAGES/DRESSINGS) ×2 IMPLANT
BNDG GAUZE DERMACEA FLUFF 4 (GAUZE/BANDAGES/DRESSINGS) IMPLANT
BNDG STRETCH GAUZE 3IN X12FT (GAUZE/BANDAGES/DRESSINGS) ×2 IMPLANT
CUFF TOURN SGL QUICK 18X4 (TOURNIQUET CUFF) ×1 IMPLANT
DRAPE FLUOR MINI C-ARM 54X84 (DRAPES) IMPLANT
DRSG EMULSION OIL 3X3 NADH (GAUZE/BANDAGES/DRESSINGS) IMPLANT
DURAPREP 26ML APPLICATOR (WOUND CARE) ×1 IMPLANT
ELECTRODE REM PT RTRN 9FT ADLT (ELECTROSURGICAL) ×2 IMPLANT
GAUZE PACKING 1/2INX5YD STRL (GAUZE/BANDAGES/DRESSINGS) IMPLANT
GAUZE PACKING IODOFORM 1/2INX (GAUZE/BANDAGES/DRESSINGS) ×2 IMPLANT
GAUZE SPONGE 4X4 12PLY STRL (GAUZE/BANDAGES/DRESSINGS) ×2 IMPLANT
GAUZE STRETCH 2X75IN STRL (MISCELLANEOUS) IMPLANT
GLOVE BIOGEL PI IND STRL 6.5 (GLOVE) ×2 IMPLANT
GLOVE SURG SYN 6.5 PF PI (GLOVE) ×2 IMPLANT
GOWN STRL REUS W/ TWL LRG LVL3 (GOWN DISPOSABLE) ×2 IMPLANT
HEMOSTAT SURGICEL 2X3 (HEMOSTASIS) ×1 IMPLANT
KIT TURNOVER KIT A (KITS) ×2 IMPLANT
MANIFOLD NEPTUNE II (INSTRUMENTS) ×2 IMPLANT
NDL HYPO 25X1 1.5 SAFETY (NEEDLE) ×1 IMPLANT
NDL SAFETY ECLIP 18X1.5 (MISCELLANEOUS) ×2 IMPLANT
NEEDLE HYPO 25X1 1.5 SAFETY (NEEDLE) ×2 IMPLANT
NS IRRIG 500ML POUR BTL (IV SOLUTION) ×2 IMPLANT
PACK EXTREMITY ARMC (MISCELLANEOUS) ×2 IMPLANT
PAD ABD DERMACEA PRESS 5X9 (GAUZE/BANDAGES/DRESSINGS) IMPLANT
PENCIL SMOKE EVACUATOR (MISCELLANEOUS) ×3 IMPLANT
SHIELD FULL FACE ANTIFOG 7M (MISCELLANEOUS) ×2 IMPLANT
SOL .9 NS 3000ML IRR UROMATIC (IV SOLUTION) ×1 IMPLANT
STOCKINETTE M/LG 89821 (MISCELLANEOUS) ×2 IMPLANT
SUT SILK 0 SH 30 (SUTURE) ×1 IMPLANT
SUTURE EHLN 3-0 FS-10 30 BLK (SUTURE) ×1 IMPLANT
SWAB CULTURE AMIES ANAERIB BLU (MISCELLANEOUS) IMPLANT
SYR 10ML LL (SYRINGE) ×6 IMPLANT
TIP FAN IRRIG PULSAVAC PLUS (DISPOSABLE) ×1 IMPLANT
TRAP FLUID SMOKE EVACUATOR (MISCELLANEOUS) ×2 IMPLANT
WATER STERILE IRR 500ML POUR (IV SOLUTION) ×2 IMPLANT

## 2024-04-25 NOTE — Plan of Care (Signed)
  Problem: Coping: Goal: Ability to adjust to condition or change in health will improve Outcome: Progressing   Problem: Metabolic: Goal: Ability to maintain appropriate glucose levels will improve Outcome: Progressing   Problem: Health Behavior/Discharge Planning: Goal: Ability to manage health-related needs will improve Outcome: Progressing   Problem: Activity: Goal: Risk for activity intolerance will decrease Outcome: Progressing   Problem: Coping: Goal: Level of anxiety will decrease Outcome: Progressing   Problem: Safety: Goal: Ability to remain free from injury will improve Outcome: Progressing

## 2024-04-25 NOTE — Anesthesia Preprocedure Evaluation (Signed)
 Anesthesia Evaluation  Patient identified by MRN, date of birth, ID band Patient awake    Reviewed: Allergy & Precautions, NPO status , Patient's Chart, lab work & pertinent test results  History of Anesthesia Complications Negative for: history of anesthetic complications  Airway Mallampati: III  TM Distance: <3 FB Neck ROM: full    Dental  (+) Missing, Dental Advidsory Given   Pulmonary neg pulmonary ROS, neg shortness of breath   Pulmonary exam normal        Cardiovascular Exercise Tolerance: Good hypertension, (-) angina Normal cardiovascular exam     Neuro/Psych neg Seizures TIA negative psych ROS   GI/Hepatic negative GI ROS, Neg liver ROS,neg GERD  ,,  Endo/Other  diabetes    Renal/GU Renal disease  negative genitourinary   Musculoskeletal   Abdominal   Peds  Hematology negative hematology ROS (+)   Anesthesia Other Findings Past Medical History: 2007: Breast cancer (HCC)     Comment:  right breast lumpectomy with 36 rad tx 11/23/2016: Breast mass     Comment:  6 o'clock No date: Diabetes mellitus without complication (HCC) No date: Hypertension  Past Surgical History: 02/23/2019: AMPUTATION; Left     Comment:  Procedure: 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  Lumpectomy with rad tx 2007: BREAST LUMPECTOMY; Right 05/02/2020: DERMAGRAFT APPLICATION; Left      Comment:  Procedure: ADJACENT SKIN TRANSFER;  Surgeon: Ashley Soulier, DPM;  Location: [facility] OR[facility];  Service: Podiatry;                Laterality: Left; 10/16/2016: INCISION AND DRAINAGE; Left     Comment:  Procedure: INCISION AND DRAINAGE;  Surgeon: Donnice Cory, DPM;  Location: [facility] OR[facility];  Service: Podiatry;                Laterality: Left; 03/02/2019: IRRIGATION AND DEBRIDEMENT FOOT; Left     Comment:  Procedure: IRRIGATION AND DEBRIDEMENT LEFT FOOT SECOND               METATARSAL, DIABETIC;  Surgeon: Ashley Soulier, DPM;                Location: [facility] OR[facility];  Service: Podiatry;  Laterality:               Left; 05/02/2020: METATARSAL HEAD EXCISION; Left     Comment:  Procedure: METATARSAL HEAD EXCISION 5th;  Surgeon:  Ashley Soulier, DPM;  Location: [facility] OR[facility];  Service:               Podiatry;  Laterality: Left;  BMI    Body Mass Index: 27.73 kg/m      Reproductive/Obstetrics negative OB ROS                              Anesthesia Physical Anesthesia Plan  ASA: 3  Anesthesia Plan: General   Post-op Pain Management:    Induction: Intravenous  PONV Risk Score and Plan: 3 and Propofol  infusion and TIVA  Airway Management Planned: Natural Airway and Nasal Cannula  Additional Equipment:   Intra-op Plan:   Post-operative Plan:   Informed Consent: I have reviewed the patients History and Physical, chart, labs and discussed the procedure including the risks, benefits and alternatives for the proposed anesthesia with the patient or authorized representative who has indicated his/her understanding and acceptance.     Dental Advisory Given  Plan Discussed with: Anesthesiologist, CRNA and Surgeon  Anesthesia Plan Comments: (Back up plan is GA with LMA.  Patient consented for risks of anesthesia including but not limited to:  - adverse reactions to medications - risk of airway placement if required - damage to  eyes, teeth, lips or other oral mucosa - nerve damage due to positioning  - sore throat or hoarseness - Damage to heart, brain, nerves, lungs, other parts of body or loss of life  Patient voiced understanding and assent.)         Anesthesia Quick Evaluation

## 2024-04-25 NOTE — Op Note (Signed)
 FOOT AND ANKLE SURGERY  OPERATIVE REPORT  SURGEON: Bona Hubbard G. Tanda, DPM ASSISTANT: Krystal Rosella, DPM  PRE-OPERATIVE DIAGNOSIS: Abscess, left foot ; Diabetic foot infection, left foot   POST-OPERATIVE DIAGNOSIS: same  PROCEDURE(S): Incision and drainage, left foot.  HEMOSTASIS: Tourniquet   ANESTHESIA: MAC  ESTIMATED BLOOD LOSS: 40cc  FINDING(S): Probing of the plantar wound and from the dorsal incision site did not reveal any substantial fluid or pus pockets.There was ability to track from plantar wound to dorsal compartment via freer - and freer was used to inspect dorsal metatarsal at the distal and midshaft areas. The bone was readily palpable and felt to be of fairly strong quality. The tissue noted on blunt exploration appeared of viable quality without frank necrosis or malodor.   INDICATIONS FOR PROCEDURE:  Joann [name] is a 78 y.o. female with diabetic foot infection / left foot wound. Left foot wound recently led to hospital admission meeting sepsis and MSSA bacteremia. She underwent I&D on 04/08/2024 and on resolution of sepsis + PICC line set up, has followed up in podiatry outpatient setting twice where she was instructed on local wound care measures. She obtained a baseline radiograph in clinic 04/24/2024 which demonstrated radiolucencies not associated with the wound soft tissue defect and more consistent with abscess versus subcutaneous emphysema. Though patient was VSS and had antibiotic coverage, acute imaging finding of deeper infection prompted recommendation for wound wash out / incision and drainage. Both medical and surgical treatment options were presented to patient, of which the patient elected to undergo surgical intervention of incision and drainage of the left foot. The procedure, alternatives, risks, and limitations in this individual case have been carefully discussed with the patient. All questions have been thoroughly answered and the patient  understands the surgery indicated. The patient has requested that this surgical intervention be undertaken and a consent form was signed.   PROCEDURAL DETAILS:  The patient was brought to the operating room and was transferred to the operating table in a supine position. The anesthesiologist administered monitored anesthesia care and IV antibiotic coverage from inpatient floor dosing was confirmed. A time-out was performed per protocol. A stockinette sleeve and overlying 18 pneumatic tourniquet was then applied to the calf and set to as a measure of hemostasis. A local infiltrative block of 7cc 1:1 Lidocaine  and Marcaine  plain was conducted for the proximal lateral aspect of the foot. The lower extremity was then prepped and draped in the usual aseptic manner. Prior to incision, patient extremity was elevated for gravity assist exsanguination and esmark was not used.   Attention was then directed to the plantar lateral foot where clinical wound was apparent approximately 2.2 cm x 2.2 cm x depth of bone (approximately 2 cm). Wound bed noted to be 90 percent granular. A mini c was used to plan dorsal incision overlying the distal aspect of the metatarsal - in order to resect bone if poor quality is noted and explore the dorsal compartment where soft tissue anomaly (abscess versus gas) was noted on clinical imaging. A dorsolateral incision along the distal aspect of the previously resected fifth metatarsal site was made extending approximately 3 cm in length. Likely secondary to multi-therapy anticoagulated state which was held morning of surgery, the patient perfusion was moderate and tourniquet was applied to assist with further exploration of the surgical site as well as evaluate need for hemostatic measures. Hemostasis was achieved with standard measures. No discrete vessels requiring ligation or tie-off were identified. The bleeding  was determined to be diffuse in nature and consistent with the  patient's anticoagulated state. Probing of the plantar wound and from the dorsal incision site did not reveal any substantial fluid or pus pockets.There was ability to track from plantar wound to dorsal compartment via freer - and freer was used to inspect dorsal metatarsal at the distal and midshaft areas. The bone was readily palpable and felt to be of fairly strong quality. The tissue noted on blunt exploration appeared of viable quality without frank necrosis or malodor. The surgical incision and plantar wound was irrigated extensively with NSS via pulse lavage. Surgical incision site was then closed with 3-0 Nylon. The incision was then dressed with xeroform, covered with 4x4 gauze, ABD pads, Kerlix, and an ACE bandage.  Overall, the patient tolerated the procedure and anesthesia well. Patient was transferred to the recovery room with vital signs stable and appropriate vascular status noted to the left foot. Following a period of post-operative monitoring, the patient will be transferred to inpatient floor to resume medical management.  The wound was copiously irrigated as indicated; however, given the pre-operative imaging in contrast to a relatively clear intraoperative field, it is recommended that the patient undergo advanced imaging postoperatively to assess for any deeper or loculated pockets of infection.  POST-OPERATIVE PLAN: Patient was instructed to be NWB to operative foot in a postoperative shoe.  Activity: Avoid excessive ambulation or dependent positioning of the operative extremity. Wound care: Podiatry to conduct post operative dressing changes as indicated. Ok to reinforce with ABD/ Kerlix for strikethrough (please leave underlying dressing intact).

## 2024-04-25 NOTE — Consult Note (Signed)
 WOC Nurse Consult Note: patient followed by podiatry for L foot neuropathic ulcer; on review of notes had I&D 04/09/2024 with wound care orders as follows; see podiatry note 04/24/2024 with plans to take patient back to or   Reason for Consult: L extremity wound  Wound type: full thickness neuropathic  Pressure Injury POA: NA  Measurement: see nursing flowsheet; 2 cm x 2 cm x 2 cm per podiatry note  Wound azi:epwx moist  Drainage (amount, consistency, odor) purulent foul smelling per podiatry note  Periwound: callused  Dressing procedure/placement/frequency: Dressing changed by podiatry 04/24/2024 with following wound care instructions:  Podiatry dressed in clinic today 04/24/24. If wound needs to be inspected, please replace with iodoform packing strips / betadine  paint to wound periphery, 4x4 gauze, ABD Kerlix + ACE   WOC team will not follow as this wound is being managed by podiatry.    Thank you,    Powell Bar MSN, RN-BC, [employer]

## 2024-04-25 NOTE — Transfer of Care (Signed)
 Immediate Anesthesia Transfer of Care Note  Patient: Joann [name]  Procedure(s) Performed: AMPUTATION, FOOT, PARTIAL (Left: Foot)  Patient Location: PACU  Anesthesia Type:General  Level of Consciousness: drowsy and patient cooperative  Airway & Oxygen Therapy: Patient Spontanous Breathing  Post-op Assessment: Report given to RN and Post -op Vital signs reviewed and stable  Post vital signs: Reviewed and stable  Last Vitals:  Vitals Value Taken Time  BP 118/61 04/25/24 14:45  Temp    Pulse 68 04/25/24 14:45  Resp 10 04/25/24 14:45  SpO2 87 % 04/25/24 14:45  Vitals shown include unfiled device data.  Last Pain:  Vitals:   04/25/24 1444  TempSrc:   PainSc: 0-No pain         Complications: No notable events documented.

## 2024-04-25 NOTE — Anesthesia Postprocedure Evaluation (Signed)
 Anesthesia Post Note  Patient: Catrinia Racicot  Procedure(s) Performed: IRRIGATION AND DEBRIDEMENT FOOT (Left)  Patient location during evaluation: PACU Anesthesia Type: General Level of consciousness: awake and alert Pain management: pain level controlled Vital Signs Assessment: post-procedure vital signs reviewed and stable Respiratory status: spontaneous breathing, nonlabored ventilation, respiratory function stable and patient connected to nasal cannula oxygen Cardiovascular status: blood pressure returned to baseline and stable Postop Assessment: no apparent nausea or vomiting Anesthetic complications: no   No notable events documented.   Last Vitals:  Vitals:   04/25/24 1510 04/25/24 1515  BP:  123/66  Pulse:  79  Resp:  18  Temp: 36.6 C   SpO2:  97%    Last Pain:  Vitals:   04/25/24 1515  TempSrc:   PainSc: 0-No pain                 Prentice Murphy

## 2024-04-25 NOTE — Brief Op Note (Signed)
 04/25/2024  2:51 PM  PATIENT:  Joann [name]  78 y.o. female  PRE-OPERATIVE DIAGNOSIS:  abscess, left foot  POST-OPERATIVE DIAGNOSIS:  abscess, left foot  PROCEDURE:  Procedure(s): IRRIGATION AND DEBRIDEMENT FOOT (Left)  SURGEON:  Surgeons and Role:    DEWAINE Tanda Greig KANDICE, DPM - Primary   ASSISTANTS: Dr. Krystal Rosella, DPM    ANESTHESIA:   MAC  EBL:  50 mL   BLOOD ADMINISTERED:none  DRAINS: none   LOCAL MEDICATIONS USED:  LIDOCAINE  / marcaine  1:1 plain    SPECIMEN:  No Specimen  DISPOSITION OF SPECIMEN:  N/A  COUNTS:  YES  TOURNIQUET:   Total Tourniquet Time Documented: Calf (Left) - 82 minutes Total: Calf (Left) - 82 minutes   DICTATION: .Note written in EPIC  PLAN OF CARE: Admit to inpatient   PATIENT DISPOSITION:  PACU - hemodynamically stable.   Delay start of Pharmacological VTE agent (>24hrs) due to surgical blood loss or risk of bleeding: yes

## 2024-04-25 NOTE — Progress Notes (Addendum)
 PROGRESS NOTE    Joann [name]  FMW:[id number] DOB: [birth year] DOA: 04/24/2024 PCP: Rudolpho Norleen BIRCH, MD  Chief Complaint  Patient presents with   Wound Infection    Hospital Course:  78 year old with history of diabetes, recent MSSA bacteremia, left foot osteomyelitis on IV cefazolin  via PICC line EOT 05/07/2024, HTN, history of TIA who was being followed by podiatrist at outpatient for left foot wound, did not get better and being admitted for further treatment and washout   Subjective: Underwent I&D Left foot No complaints    Objective: Vitals:   04/24/24 1817 04/24/24 2004 04/25/24 0417 04/25/24 0739  BP: (!) 136/54 (!) 128/90 (!) 158/67 (!) 143/63  Pulse: 83 79 69 77  Resp: 17 20 19 16   Temp: 99.1 F (37.3 C) 98.8 F (37.1 C) 98 F (36.7 C) 98.3 F (36.8 C)  TempSrc:    Oral  SpO2: 98% 95% 96% 94%  Weight:      Height:        Intake/Output Summary (Last 24 hours) at 04/25/2024 0916 Last data filed at 04/25/2024 0315 Gross per 24 hour  Intake 95.22 ml  Output --  Net 95.22 ml   Filed Weights   04/24/24 1145  Weight: 65.3 kg    Examination: Constitutional: Alert, awake, calm, comfortable Respiratory: Clear to auscultation B/L, no wheezing, no rales.  Cardiovascular: Regular rate and rhythm, no murmurs / rubs / gallops. No extremity edema. 2+ pedal pulses. No carotid bruits.  Abdomen: Soft, no tenderness, Bowel sounds positive.  Musculoskeletal: Left foot dressing Skin: Left foot plantar aspect has a nonhealing wound. Neurologic: No gross focal deficits  Assessment & Plan:   Left foot osteomyelitis/wound failed outpatient treatment - Patient has a history of MSSA and on active cefazolin , EOT 05/07/2024 - Seen by Podiatry and ID, appreciate recs - On IV Cefazolin  - underwent I&D 10/01 - NWB LLE - Hold Lovenox  as patient had significant bleed introp  Hx TIA - was on asa 21 days and plavix  75mg  - resume tomorrow  Diabetes type 2 - Insulin   sliding scale  CKD stage 3a/b - Monitor Cr  HTN - Stable - Continue home dose of hydrochlorothiazide  - Continue to monitor blood pressure   DVT prophylaxis: SCD   Code Status: Full Code Disposition:  TBD  Consultants:  Podiatry Infectious disease  Procedures:  underwent I&D Left foot 10/01  Antimicrobials:  Anti-infectives (From admission, onward)    Start     Dose/Rate Route Frequency Ordered Stop   04/24/24 1700  ceFAZolin  (ANCEF ) IVPB 1 g/50 mL premix  Status:  Discontinued        1 g 100 mL/hr over 30 Minutes Intravenous Every 12 hours 04/24/24 1614 04/24/24 1623   04/24/24 1700  ceFAZolin  (ANCEF ) IVPB 2g/100 mL premix        2 g 200 mL/hr over 30 Minutes Intravenous Every 12 hours 04/24/24 1623         Data Reviewed: I have personally reviewed following labs and imaging studies CBC: Recent Labs  Lab 04/24/24 1147 04/24/24 1730 04/25/24 0637  WBC 8.2 5.9 5.1  NEUTROABS 5.7  --   --   HGB 11.1* 10.3* 10.2*  HCT 34.0* 31.3* 30.4*  MCV 89.9 88.4 88.6  PLT 279 226 230   Basic Metabolic Panel: Recent Labs  Lab 04/24/24 1147 04/24/24 1730 04/25/24 0637  NA 138  --  137  K 4.3  --  4.3  CL 102  --  105  CO2  22  --  26  GLUCOSE 92  --  93  BUN 28*  --  24*  CREATININE 1.36* 1.41* 1.26*  CALCIUM  9.6  --  8.7*   GFR: Estimated Creatinine Clearance: 31.6 mL/min (A) (by C-G formula based on SCr of 1.26 mg/dL (H)). Liver Function Tests: Recent Labs  Lab 04/24/24 1147 04/25/24 0637  AST 58* 44*  ALT 10 9  ALKPHOS 116 106  BILITOT 0.8 0.9  PROT 6.7 6.0*  ALBUMIN 2.8* 2.6*   CBG: Recent Labs  Lab 04/24/24 1728 04/24/24 2149 04/25/24 0725  GLUCAP 97 109* 92    Recent Results (from the past 240 hours)  MRSA Next Gen by PCR, Nasal     Status: None   Collection Time: 04/25/24  4:30 AM   Specimen: Nasal Mucosa; Nasal Swab  Result Value Ref Range Status   MRSA by PCR Next Gen NOT DETECTED NOT DETECTED Final    Comment: (NOTE) The GeneXpert  MRSA Assay (FDA approved for NASAL specimens only), is one component of a comprehensive MRSA colonization surveillance program. It is not intended to diagnose MRSA infection nor to guide or monitor treatment for MRSA infections. Test performance is not FDA approved in patients less than 11 years old. Performed at [facility], [address] [place], [place] [address]      Radiology Studies: No results found.  Scheduled Meds:  Chlorhexidine  Gluconate Cloth  6 each Topical Daily   enoxaparin  (LOVENOX ) injection  30 mg Subcutaneous Q24H   hydrochlorothiazide   12.5 mg Oral Daily   insulin  aspart  0-5 Units Subcutaneous QHS   insulin  aspart  0-9 Units Subcutaneous TID WC   sodium chloride  flush  10-40 mL Intracatheter Q12H   sodium chloride  flush  10-40 mL Intracatheter Q12H   Continuous Infusions:   ceFAZolin  (ANCEF ) IV 2 g (04/25/24 0431)     LOS: 1 day  MDM: Patient is high risk for one or more organ failure Laree Lock, MD [facility]  To contact the attending physician between 7A-7P please use Epic Chat. To contact the covering physician during after hours 7P-7A, please review Amion.  04/25/2024, 9:16 AM   *This document has been created with the assistance of dictation software. Please excuse typographical errors. *

## 2024-04-25 NOTE — Anesthesia Procedure Notes (Signed)
 Procedure Name: MAC Date/Time: 04/25/2024 12:51 PM  Performed by: Lorrene Camelia LABOR, CRNAPre-anesthesia Checklist: Patient identified, Emergency Drugs available, Suction available and Patient being monitored Patient Re-evaluated:Patient Re-evaluated prior to induction Oxygen Delivery Method: Simple face mask Preoxygenation: Pre-oxygenation with 100% oxygen Induction Type: IV induction

## 2024-04-25 NOTE — Plan of Care (Signed)
  Problem: Education: Goal: Ability to describe self-care measures that may prevent or decrease complications (Diabetes Survival Skills Education) will improve 04/25/2024 1811 by Rutherford Rosaline LABOR, RN Outcome: Progressing 04/25/2024 1810 by Rutherford Rosaline LABOR, RN Outcome: Progressing   Problem: Coping: Goal: Ability to adjust to condition or change in health will improve 04/25/2024 1811 by Rutherford Rosaline LABOR, RN Outcome: Progressing 04/25/2024 1810 by Rutherford Rosaline LABOR, RN Outcome: Progressing   Problem: Fluid Volume: Goal: Ability to maintain a balanced intake and output will improve 04/25/2024 1811 by Rutherford Rosaline LABOR, RN Outcome: Progressing 04/25/2024 1810 by Rutherford Rosaline LABOR, RN Outcome: Progressing   Problem: Health Behavior/Discharge Planning: Goal: Ability to identify and utilize available resources and services will improve Outcome: Progressing Goal: Ability to manage health-related needs will improve Outcome: Progressing   Problem: Nutritional: Goal: Maintenance of adequate nutrition will improve Outcome: Progressing   Problem: Skin Integrity: Goal: Risk for impaired skin integrity will decrease Outcome: Progressing   Problem: Tissue Perfusion: Goal: Adequacy of tissue perfusion will improve Outcome: Progressing   Problem: Clinical Measurements: Goal: Ability to maintain clinical measurements within normal limits will improve Outcome: Progressing

## 2024-04-25 NOTE — Progress Notes (Signed)
 Date of Admission:  04/24/2024      ID: Joann [name] is a 78 y.o. female  Principal Problem:   Left foot infection Active Problems:   Osteomyelitis of left foot (HCC)   DM2 (diabetes mellitus, type 2) (HCC)   Hypertension    Subjective: Went for surgery today As per podiatrist no purulence or any abnormality found No cultures were sent Patient is doing fine  Medications:   [MAR Hold] Chlorhexidine  Gluconate Cloth  6 each Topical Daily   [MAR Hold] enoxaparin  (LOVENOX ) injection  30 mg Subcutaneous Q24H   [MAR Hold] hydrochlorothiazide   12.5 mg Oral Daily   [MAR Hold] insulin  aspart  0-5 Units Subcutaneous QHS   [MAR Hold] insulin  aspart  0-9 Units Subcutaneous TID WC   [MAR Hold] sodium chloride  flush  10-40 mL Intracatheter Q12H   [MAR Hold] sodium chloride  flush  10-40 mL Intracatheter Q12H    Objective: Vital signs in last 24 hours: Patient Vitals for the past 24 hrs:  BP Temp Temp src Pulse Resp SpO2  04/25/24 1157 (!) 162/71 97.9 F (36.6 C) Temporal 68 17 95 %  04/25/24 0739 (!) 143/63 98.3 F (36.8 C) Oral 77 16 94 %  04/25/24 0417 (!) 158/67 98 F (36.7 C) -- 69 19 96 %  04/24/24 2004 (!) 128/90 98.8 F (37.1 C) -- 79 20 95 %  04/24/24 1817 (!) 136/54 99.1 F (37.3 C) -- 83 17 98 %  04/24/24 1536 136/60 -- Oral 83 18 99 %  04/24/24 1500 (!) 152/69 -- -- 86 18 96 %  04/24/24 1430 127/61 -- -- 70 -- 100 %  04/24/24 1400 128/69 -- -- 80 -- 96 %  04/24/24 1353 (!) 144/64 98 F (36.7 C) -- 85 18 97 %    Right PICC line   PHYSICAL EXAM:  General: Alert, cooperative, no distress, appears stated age.  Lungs: Clear to auscultation bilaterally. No Wheezing or Rhonchi. No rales. Heart: Regular rate and rhythm, no murmur, rub or gallop. Abdomen: Soft, non-tender,not distended. Bowel sounds normal. No masses Extremities: Left foot surgical dressing not removed Skin: No rashes or lesions. Or bruising Lymph: Cervical, supraclavicular  normal. Neurologic: Grossly non-focal  Lab Results    Latest Ref Rng & Units 04/25/2024    6:37 AM 04/24/2024    5:30 PM 04/24/2024   11:47 AM  CBC  WBC 4.0 - 10.5 K/uL 5.1  5.9  8.2   Hemoglobin 12.0 - 15.0 g/dL 89.7  89.6  88.8   Hematocrit 36.0 - 46.0 % 30.4  31.3  34.0   Platelets 150 - 400 K/uL 230  226  279        Latest Ref Rng & Units 04/25/2024    6:37 AM 04/24/2024    5:30 PM 04/24/2024   11:47 AM  CMP  Glucose 70 - 99 mg/dL 93   92   BUN 8 - 23 mg/dL 24   28   Creatinine 9.55 - 1.00 mg/dL 8.73  8.58  8.63   Sodium 135 - 145 mmol/L 137   138   Potassium 3.5 - 5.1 mmol/L 4.3   4.3   Chloride 98 - 111 mmol/L 105   102   CO2 22 - 32 mmol/L 26   22   Calcium  8.9 - 10.3 mg/dL 8.7   9.6   Total Protein 6.5 - 8.1 g/dL 6.0   6.7   Total Bilirubin 0.0 - 1.2 mg/dL 0.9   0.8  Alkaline Phos 38 - 126 U/L 106   116   AST 15 - 41 U/L 44   58   ALT 0 - 44 U/L 9   10       Microbiology: None     Assessment/Plan: Recent MSSA bacteremia secondary to left foot infection Left foot abscess with necrosis at the site of the callus on the plantar aspect I&D last admission MSSA and Proteus in culture The cefazolin  was intermediate susceptible to Proteus Patient was discharged on cefazolin  to cover both Staph aureus and Proteus The plan was to give her 4 weeks of IV until 05/07/2024 We may have to do extend by 2 weeks until 05/21/2024  CKD stable  Hypertension on hydrochlorothiazide   Discussed the management with the patient and her husband at bedside. Discussed with the podiatrist and the hospitalist

## 2024-04-26 ENCOUNTER — Other Ambulatory Visit: Payer: Self-pay

## 2024-04-26 DIAGNOSIS — R7881 Bacteremia: Secondary | ICD-10-CM | POA: Diagnosis not present

## 2024-04-26 DIAGNOSIS — B9561 Methicillin susceptible Staphylococcus aureus infection as the cause of diseases classified elsewhere: Secondary | ICD-10-CM

## 2024-04-26 DIAGNOSIS — L02612 Cutaneous abscess of left foot: Secondary | ICD-10-CM | POA: Diagnosis not present

## 2024-04-26 DIAGNOSIS — M869 Osteomyelitis, unspecified: Secondary | ICD-10-CM | POA: Diagnosis not present

## 2024-04-26 DIAGNOSIS — B964 Proteus (mirabilis) (morganii) as the cause of diseases classified elsewhere: Secondary | ICD-10-CM | POA: Diagnosis not present

## 2024-04-26 LAB — BASIC METABOLIC PANEL WITH GFR
Anion gap: 6 (ref 5–15)
BUN: 19 mg/dL (ref 8–23)
CO2: 24 mmol/L (ref 22–32)
Calcium: 8.5 mg/dL — ABNORMAL LOW (ref 8.9–10.3)
Chloride: 105 mmol/L (ref 98–111)
Creatinine, Ser: 1.19 mg/dL — ABNORMAL HIGH (ref 0.44–1.00)
GFR, Estimated: 46 mL/min — ABNORMAL LOW (ref >60)
Glucose, Bld: 80 mg/dL (ref 70–99)
Potassium: 4.2 mmol/L (ref 3.5–5.1)
Sodium: 135 mmol/L (ref 135–145)

## 2024-04-26 LAB — GLUCOSE, CAPILLARY
Glucose-Capillary: 79 mg/dL (ref 70–99)
Glucose-Capillary: 90 mg/dL (ref 70–99)
Glucose-Capillary: 82 mg/dL (ref 70–99)
Glucose-Capillary: 87 mg/dL (ref 70–99)

## 2024-04-26 LAB — CBC
HCT: 28.8 % — ABNORMAL LOW (ref 36.0–46.0)
Hemoglobin: 9.4 g/dL — ABNORMAL LOW (ref 12.0–15.0)
MCH: 29.7 pg (ref 26.0–34.0)
MCHC: 32.6 g/dL (ref 30.0–36.0)
MCV: 90.9 fL (ref 80.0–100.0)
Platelets: 184 K/uL (ref 150–400)
RBC: 3.17 MIL/uL — ABNORMAL LOW (ref 3.87–5.11)
RDW: 15.5 % (ref 11.5–15.5)
WBC: 4.7 K/uL (ref 4.0–10.5)
nRBC: 0 % (ref 0.0–0.2)

## 2024-04-26 MED ORDER — CEFAZOLIN IV (FOR PTA / DISCHARGE USE ONLY): 2.0000 g | Freq: Three times a day (TID) | INTRAVENOUS | 0 refills | Status: AC

## 2024-04-26 MED ORDER — CEFAZOLIN SODIUM-DEXTROSE 2-4 GM/100ML-% IV SOLN
2.0000 g | Freq: Three times a day (TID) | INTRAVENOUS | Status: DC
  Administered 2024-04-26 – 2024-04-27 (×4): 2 g via INTRAVENOUS
  Filled 2024-04-26 (×4): qty 100

## 2024-04-26 MED ORDER — ASPIRIN 81 MG PO TBEC
81.0000 mg | DELAYED_RELEASE_TABLET | Freq: Every day | ORAL | Status: DC | PRN
Start: 2024-04-26 — End: 2024-04-27

## 2024-04-26 NOTE — Plan of Care (Signed)
  Problem: Education: Goal: Ability to describe self-care measures that may prevent or decrease complications (Diabetes Survival Skills Education) will improve Outcome: Not Progressing   Problem: Coping: Goal: Ability to adjust to condition or change in health will improve Outcome: Progressing   Problem: Skin Integrity: Goal: Risk for impaired skin integrity will decrease Outcome: Progressing   Problem: Clinical Measurements: Goal: Respiratory complications will improve Outcome: Adequate for Discharge Goal: Cardiovascular complication will be avoided Outcome: Adequate for Discharge

## 2024-04-26 NOTE — Progress Notes (Signed)
 Date of Admission:  04/24/2024      ID: Joann [name] is a 78 y.o. female  Principal Problem:   Left foot infection Active Problems:   Osteomyelitis of left foot (HCC)   DM2 (diabetes mellitus, type 2) (HCC)   Hypertension   MSSA bacteremia    Subjective: Patient is doing fine No complaints  Medications:   Chlorhexidine  Gluconate Cloth  6 each Topical Daily   clopidogrel   75 mg Oral Daily   hydrochlorothiazide   12.5 mg Oral Daily   insulin  aspart  0-5 Units Subcutaneous QHS   insulin  aspart  0-9 Units Subcutaneous TID WC   sodium chloride  flush  10-40 mL Intracatheter Q12H   sodium chloride  flush  10-40 mL Intracatheter Q12H    Objective: Vital signs in last 24 hours: Patient Vitals for the past 24 hrs:  BP Temp Temp src Pulse Resp SpO2  04/26/24 1541 129/66 97.9 F (36.6 C) -- (!) 58 19 100 %  04/26/24 1157 132/61 98.5 F (36.9 C) Oral 60 18 98 %  04/26/24 0739 (!) 134/58 98.2 F (36.8 C) Oral 71 18 97 %  04/26/24 0342 (!) 118/58 97.6 F (36.4 C) Oral 66 15 98 %    Right PICC line   PHYSICAL EXAM:  General: Alert, cooperative, no distress, appears stated age.  Lungs: Clear to auscultation bilaterally. No Wheezing or Rhonchi. No rales. Heart: Regular rate and rhythm, no murmur, rub or gallop. Abdomen: Soft, non-tender,not distended. Bowel sounds normal. No masses Extremities: Left foot surgical dressing not removed Skin: No rashes or lesions. Or bruising Lymph: Cervical, supraclavicular normal. Neurologic: Grossly non-focal  Lab Results    Latest Ref Rng & Units 04/26/2024    4:34 AM 04/25/2024    6:37 AM 04/24/2024    5:30 PM  CBC  WBC 4.0 - 10.5 K/uL 4.7  5.1  5.9   Hemoglobin 12.0 - 15.0 g/dL 9.4  89.7  89.6   Hematocrit 36.0 - 46.0 % 28.8  30.4  31.3   Platelets 150 - 400 K/uL 184  230  226        Latest Ref Rng & Units 04/26/2024    4:34 AM 04/25/2024    6:37 AM 04/24/2024    5:30 PM  CMP  Glucose 70 - 99 mg/dL 80  93    BUN 8 - 23  mg/dL 19  24    Creatinine 9.55 - 1.00 mg/dL 8.80  8.73  8.58   Sodium 135 - 145 mmol/L 135  137    Potassium 3.5 - 5.1 mmol/L 4.2  4.3    Chloride 98 - 111 mmol/L 105  105    CO2 22 - 32 mmol/L 24  26    Calcium  8.9 - 10.3 mg/dL 8.5  8.7    Total Protein 6.5 - 8.1 g/dL  6.0    Total Bilirubin 0.0 - 1.2 mg/dL  0.9    Alkaline Phos 38 - 126 U/L  106    AST 15 - 41 U/L  44    ALT 0 - 44 U/L  9        Microbiology: None  CRP 0.6 SED 30    Assessment/Plan: Recent MSSA bacteremia secondary to left foot infection Left foot abscess with necrosis at the site of the callus on the plantar aspect I&D last admission MSSA and Proteus in culture The cefazolin  was intermediate susceptible to Proteus Patient was discharged on cefazolin  to cover both Staph aureus and Proteus ent  for surgery today As per podiatrist no purulence or any abnormality found No cultures were sent The plan was to give her 4 weeks of IV cefazolin  until 05/07/2024- continue until 05/07/24 I will follow her as OP on 05/03/24 and decide if she needs more IV or PO    CKD stable  Hypertension on hydrochlorothiazide   Discussed the management with the patient and her husband at bedside. Discussed with the podiatrist and the hospitalist

## 2024-04-26 NOTE — Evaluation (Signed)
 Physical Therapy Evaluation Patient Details Name: Joann [name] MRN: [id number] DOB: 12-22-1945 Today's Date: 04/26/2024  History of Present Illness  Pt is an 78 y/o F presenting to ED from outpatient podiatry clinic with L foot wound. Pt underwent I&D of L foot on 04/25/24. MD assessment includes L foot osteomyelitis/wound failed outpatient treatment, diabetes, HTN. 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 Pt was recently d/c on 9/18 and was receiving home health services for wound care and PT/OT.   Clinical Impression  Pt A&Ox4, pleasant and agreeable to participate in PT evaluation. Since recent d/c on 9/18, pt reports that her mobility has been limited because of her L foot wound, has required increased assistance from her husband for ADLs. Pt was met sitting at EOB, performed 1 STS from elevated EOB with max VC for hand placement and LLE positioning, pt unable to stand, shuffle, or hop on RLE while adhering to NWB precautions. Pt able to laterally scoot up toward [facility] and to recliner with supervision with good carryover of LLE positioning for NWB. Extensive education provided in session on importance of NWB precautions for proper wound healing. Pt was left seated in recliner at end of session, all needs in reach. Pt would benefit from skilled PT intervention to address listed deficits (see PT Problem List) and maximize independence with mobility/ADLs.         If plan is discharge home, recommend the following: A little help with walking and/or transfers;A little help with bathing/dressing/bathroom;Assistance with cooking/housework;Assist for transportation;Help with stairs or ramp for entrance   Can travel by private vehicle        Equipment Recommendations Wheelchair (measurements PT)  Recommendations for Other Services       Functional Status Assessment Patient has had a recent decline in their functional status and demonstrates the  ability to make significant improvements in function in a reasonable and predictable amount of time.     Precautions / Restrictions Precautions Precautions: Fall Recall of Precautions/Restrictions: Intact Precaution/Restrictions Comments: per podiatry op note pt is to be NWB on LLE with post-op shoe Required Braces or Orthoses: Other Brace Other Brace: L post-op shoe Restrictions Weight Bearing Restrictions Per Provider Order: Yes LLE Weight Bearing Per Provider Order: Non weight bearing Other Position/Activity Restrictions: Per podiatry op not: Patient was instructed to be NWB to operative foot in a postoperative shoe.   Activity: Avoid excessive ambulation or dependent positioning of the operative extremity.      Mobility  Bed Mobility               General bed mobility comments: NT this session, pt sitting at EOB at start of session    Transfers Overall transfer level: Needs assistance Equipment used: Rolling walker (2 wheels), None Transfers: Sit to/from Stand, Bed to chair/wheelchair/BSC Sit to Stand: Contact guard assist, From elevated surface          Lateral/Scoot Transfers: Supervision General transfer comment: STS from elevated EOB with CGA, VC to adhere to NWB precautions. Pt able to laterally scoot to recliner with supervision with good adherance to precautions    Ambulation/Gait               General Gait Details: deferred this session, attempted to have pt hop and shuffle on RLE, pt unable to do either while adhering to NWB precautions  Stairs            Wheelchair Mobility  Tilt Bed    Modified Rankin (Stroke Patients Only)       Balance Overall balance assessment: Needs assistance Sitting-balance support: Feet supported Sitting balance-Leahy Scale: Good Sitting balance - Comments: steady static and dynamic sitting   Standing balance support: Bilateral upper extremity supported, Reliant on assistive device for  balance Standing balance-Leahy Scale: Poor Standing balance comment: unable to stand on 1 leg to adhere to NWB precautions                             Pertinent Vitals/Pain Pain Assessment Pain Assessment: No/denies pain    Home Living Family/patient expects to be discharged to:: Private residence Living Arrangements: Spouse/significant other Available Help at Discharge: Family;Available 24 hours/day Type of Home: House Home Access: Stairs to enter Entrance Stairs-Rails: Left Entrance Stairs-Number of Steps: 3   Home Layout: One level;Laundry or work area in [employer] Equipment: Agricultural consultant (2 wheels);Cane - single point;BSC/3in1;Grab bars - toilet;Grab bars - tub/shower Additional Comments: pt was receiving HH PT/OT 1-2 times per week prior to admission    Prior Function Prior Level of Function : Needs assist             Mobility Comments: From most recent discharge, pt reports limited ambulation/mobility due to L foot wound ADLs Comments: Pt's spouse has been assisting with cooking, cleaning, and basic ADLs for pt since recent d/c     Extremity/Trunk Assessment   Upper Extremity Assessment Upper Extremity Assessment: Overall WFL for tasks assessed    Lower Extremity Assessment RLE Deficits / Details: hip flexion 4/5, knee extension 4+/5, DF 4-/5 LLE Deficits / Details: not formally tested, grossly at least 3+/5       Communication   Communication Communication: Impaired Factors Affecting Communication: Hearing impaired    Cognition Arousal: Alert Behavior During Therapy: WFL for tasks assessed/performed   PT - Cognitive impairments: No apparent impairments                       PT - Cognition Comments: A&Ox4, pleasant and cooperative Following commands: Impaired Following commands impaired: Follows one step commands with increased time     Cueing Cueing Techniques: Verbal cues, Visual cues     General Comments General  comments (skin integrity, edema, etc.): L foot ace-wrapped, dressings appeared dry and intact    Exercises     Assessment/Plan    PT Assessment Patient needs continued PT services  PT Problem List Decreased strength;Decreased activity tolerance;Decreased balance;Decreased mobility;Decreased safety awareness;Decreased knowledge of precautions       PT Treatment Interventions DME instruction;Gait training;Stair training;Functional mobility training;Therapeutic activities;Therapeutic exercise;Balance training;Neuromuscular re-education;Patient/family education    PT Goals (Current goals can be found in the Care Plan section)  Acute Rehab PT Goals Patient Stated Goal: to get better and stronger PT Goal Formulation: With patient Time For Goal Achievement: 05/10/24 Potential to Achieve Goals: Fair    Frequency Min 3X/week     Co-evaluation               AM-PAC PT 6 Clicks Mobility  Outcome Measure Help needed turning from your back to your side while in a flat bed without using bedrails?: None Help needed moving from lying on your back to sitting on the side of a flat bed without using bedrails?: A Little Help needed moving to and from a bed to a chair (including a wheelchair)?: A Little Help needed standing up from  a chair using your arms (e.g., wheelchair or bedside chair)?: A Little Help needed to walk in hospital room?: A Lot Help needed climbing 3-5 steps with a railing? : A Lot 6 Click Score: 17    End of Session Equipment Utilized During Treatment: Gait belt Activity Tolerance: Patient tolerated treatment well Patient left: in chair;with call bell/phone within reach;with chair alarm set;with family/visitor present Nurse Communication: Mobility status PT Visit Diagnosis: Unsteadiness on feet (R26.81);Muscle weakness (generalized) (M62.81);Difficulty in walking, not elsewhere classified (R26.2)    Time: 1350-1415 PT Time Calculation (min) (ACUTE ONLY): 25  min   Charges:   PT Evaluation $PT Eval Moderate Complexity: 1 Mod   PT General Charges $$ ACUTE PT VISIT: 1 Visit         Janell Axe, SPT

## 2024-04-26 NOTE — Progress Notes (Signed)
 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  via PICC line EOT 05/07/2024, HTN, history of TIA who was being followed by podiatrist at outpatient for left foot wound, did not get better and being admitted for further treatment and washout   Subjective: Patient underwent I&D of left foot yesterday, states she feels better and denies any complaints today Husband present at the bedside   Objective: Vitals:   04/26/24 0342 04/26/24 0739 04/26/24 1157 04/26/24 1541  BP: (!) 118/58 (!) 134/58 132/61 129/66  Pulse: 66 71 60 (!) 58  Resp: 15 18 18 19   Temp: 97.6 F (36.4 C) 98.2 F (36.8 C) 98.5 F (36.9 C) 97.9 F (36.6 C)  TempSrc: Oral Oral Oral   SpO2: 98% 97% 98% 100%  Weight:      Height:        Intake/Output Summary (Last 24 hours) at 04/26/2024 1614 Last data filed at 04/26/2024 1300 Gross per 24 hour  Intake 1074.65 ml  Output --  Net 1074.65 ml   Filed Weights   04/24/24 1145  Weight: 65.3 kg    Examination: Constitutional: Alert, awake, calm, comfortable Respiratory: Clear to auscultation B/L, no wheezing, no rales.  Cardiovascular: Regular rate and rhythm, no murmurs / rubs / gallops. No extremity edema. 2+ pedal pulses. No carotid bruits.  Abdomen: Soft, no tenderness, Bowel sounds positive.  Musculoskeletal: Left foot surgical dressing Skin: Left foot plantar aspect has a nonhealing wound. Neurologic: No gross focal deficits  Assessment & Plan:   Left foot osteomyelitis/wound failed outpatient treatment - Patient has a history of MSSA and on active cefazolin , EOT 05/07/2024, no change in abx - Seen by Podiatry and ID, appreciate recs - On IV Cefazolin  - underwent I&D 10/01 - [facility] LLE - PT/OT eval  pending  Mild drop in Hb - S/p I&D, no evidence of further bleeding - Monitor Hb  Hx TIA - was on asa 21 days and plavix  75mg   Diabetes type 2 - Insulin  sliding scale  CKD stage 3a/b - Monitor Cr  HTN - Stable - Continue home dose of hydrochlorothiazide  - Continue to monitor blood pressure  -- PT/OT eval pending   DVT prophylaxis: SCD   Code Status: Full Code Disposition:  TBD Pending podiatry clearance, PT/OT eval. anticipate discharge tomorrow if hemoglobin stable  Consultants:  Podiatry Infectious disease  Procedures:  underwent I&D Left foot 10/01  Antimicrobials:  Anti-infectives (From admission, onward)    Start     Dose/Rate Route Frequency Ordered Stop   04/26/24 1400  ceFAZolin  (ANCEF ) IVPB 2g/100 mL premix        2 g 200 mL/hr over 30 Minutes Intravenous Every 8 hours 04/26/24 0843     04/24/24 1700  ceFAZolin  (ANCEF ) IVPB 1 g/50 mL premix  Status:  Discontinued        1 g 100 mL/hr over 30 Minutes Intravenous Every 12 hours 04/24/24 1614 04/24/24 1623   04/24/24 1700  ceFAZolin  (ANCEF ) IVPB 2g/100 mL premix  Status:  Discontinued        2 g 200 mL/hr over 30 Minutes Intravenous Every 12 hours 04/24/24 1623 04/26/24 0843       Data Reviewed: I have personally reviewed following labs and imaging studies CBC: Recent Labs  Lab 04/24/24 1147 04/24/24 1730 04/25/24 0637 04/26/24 0434  WBC 8.2 5.9 5.1 4.7  NEUTROABS 5.7  --   --   --   HGB 11.1* 10.3* 10.2* 9.4*  HCT 34.0* 31.3* 30.4* 28.8*  MCV 89.9 88.4 88.6 90.9  PLT 279 226 230 184   Basic Metabolic Panel: Recent Labs  Lab 04/24/24 1147 04/24/24 1730 04/25/24 0637 04/26/24 0434  NA 138  --  137 135  K 4.3  --  4.3 4.2  CL 102  --  105 105  CO2 22  --  26 24  GLUCOSE 92  --  93 80  BUN 28*  --  24* 19  CREATININE 1.36* 1.41* 1.26* 1.19*  CALCIUM  9.6  --  8.7* 8.5*   GFR: Estimated Creatinine Clearance: 33.5 mL/min (A) (by C-G formula based on SCr of 1.19 mg/dL (H)). Liver  Function Tests: Recent Labs  Lab 04/24/24 1147 04/25/24 0637  AST 58* 44*  ALT 10 9  ALKPHOS 116 106  BILITOT 0.8 0.9  PROT 6.7 6.0*  ALBUMIN 2.8* 2.6*   CBG: Recent Labs  Lab 04/25/24 1446 04/25/24 1732 04/25/24 2114 04/26/24 0732 04/26/24 1158  GLUCAP 94 78 89 79 90    Recent Results (from the past 240 hours)  MRSA Next Gen by PCR, Nasal     Status: None   Collection Time: 04/25/24  4:30 AM   Specimen: Nasal Mucosa; Nasal Swab  Result Value Ref Range Status   MRSA by PCR Next Gen NOT DETECTED NOT DETECTED Final    Comment: (NOTE) The GeneXpert MRSA Assay (FDA approved for NASAL specimens only), is one component of a comprehensive MRSA colonization surveillance program. It is not intended to diagnose MRSA infection nor to guide or monitor treatment for MRSA infections. Test performance is not FDA approved in patients less than 23 years old. Performed at [facility], [address], [place], [place] [address]      Radiology Studies: CT FOOT LEFT WO CONTRAST Result Date: 04/26/2024 CLINICAL DATA:  Plantar foot wound status post incision and drainage on 04/25/2024. History of prior partial metatarsal amputations. EXAM: CT OF THE LEFT FOOT WITHOUT CONTRAST TECHNIQUE: Multidetector CT imaging of the left foot was performed according to the standard protocol. Multiplanar CT image reconstructions were also generated. RADIATION DOSE REDUCTION: This exam was performed according to the departmental dose-optimization program which includes automated exposure control, adjustment of the mA and/or kV according to patient size and/or use of iterative reconstruction technique. COMPARISON:  MRI of the left foot dated 04/08/2024. FINDINGS: Soft tissue Status post incision and drainage of a soft tissue wound along the plantar lateral aspect of the forefoot, near the level of the fifth metatarsal amputation, with soft tissue defect/wound extending to the plantar margin of the mid to  distal fifth metatarsal, just proximal to the transsection margin. No discrete loculated fluid collection. There is surrounding cutaneous and soft tissue thickening with edema. Muscles and Tendons Chronic atrophy of the intrinsic foot musculature. Postoperative changes related to prior partial second ray amputation and partial amputation of the fifth metatarsal and base of the fifth proximal phalanx. No discrete loculated intramuscular fluid collection identified. Bones/Joint/Cartilage Postoperative changes related to partial second ray amputation at the level of the second metatarsal neck and amputation of the distal fifth metatarsal and the base of the fifth proximal phalanx. No definite acute osteolysis identified. No acute fracture or dislocation. Unchanged chronic flattening of third metatarsal head. Moderate degenerative arthropathy of the  midfoot, most pronounced at the second and third TMT joints and the navicular-cuneiform articulation. Diffuse interphalangeal joint space narrowing. Mild osteoarthritis of the first MTP joint. Calcaneal enthesopathy at the insertion of the Achilles tendon and the origin of the central cord of the plantar fascia. Ligaments Ligaments are suboptimally evaluated by CT. IMPRESSION: 1. Status post incision and drainage of a soft tissue wound along the plantar lateral aspect of the forefoot, near the level of the fifth metatarsal amputation, with soft tissue defect/wound extending to the plantar margin of the mid to distal fifth metatarsal, just proximal to the transsection margin. No definite acute osteolysis identified. No discrete loculated fluid collection. Surrounding cutaneous and soft tissue thickening and edema may reflect postsurgical change, however, cellulitis can not be excluded. 2. Postoperative changes related to partial second ray amputation at the level of the second metatarsal neck and amputation of the distal fifth metatarsal and the base of the fifth proximal  phalanx. 3. Moderate degenerative arthropathy of the midfoot. Electronically Signed   By: Harrietta Sherry M.D.   On: 04/26/2024 11:18   DG MINI C-ARM IMAGE ONLY Result Date: 04/25/2024 There is no interpretation for this exam.  This order is for images obtained during a surgical procedure.  Please See Surgeries Tab for more information regarding the procedure.    Scheduled Meds:  Chlorhexidine  Gluconate Cloth  6 each Topical Daily   clopidogrel   75 mg Oral Daily   hydrochlorothiazide   12.5 mg Oral Daily   insulin  aspart  0-5 Units Subcutaneous QHS   insulin  aspart  0-9 Units Subcutaneous TID WC   sodium chloride  flush  10-40 mL Intracatheter Q12H   sodium chloride  flush  10-40 mL Intracatheter Q12H   Continuous Infusions:   ceFAZolin  (ANCEF ) IV 2 g (04/26/24 1439)     LOS: 2 days  MDM: Patient is high risk for one or more organ failure Laree Lock, MD [facility]  To contact the attending physician between 7A-7P please use Epic Chat. To contact the covering physician during after hours 7P-7A, please review Amion.  04/26/2024, 4:14 PM   *This document has been created with the assistance of dictation software. Please excuse typographical errors. *

## 2024-04-27 DIAGNOSIS — L089 Local infection of the skin and subcutaneous tissue, unspecified: Secondary | ICD-10-CM | POA: Diagnosis not present

## 2024-04-27 LAB — GLUCOSE, CAPILLARY
Glucose-Capillary: 82 mg/dL (ref 70–99)
Glucose-Capillary: 101 mg/dL — ABNORMAL HIGH (ref 70–99)

## 2024-04-27 LAB — HEMOGLOBIN: Hemoglobin: 9.5 g/dL — ABNORMAL LOW (ref 12.0–15.0)

## 2024-04-27 MED ORDER — ASPIRIN EC 81 MG PO TBEC: 81.0000 mg | DELAYED_RELEASE_TABLET | Freq: Every day | ORAL | 0 refills | Status: AC

## 2024-04-27 MED ORDER — ASPIRIN 81 MG PO TBEC
81.0000 mg | DELAYED_RELEASE_TABLET | Freq: Every day | ORAL | Status: DC
  Administered 2024-04-27: 81 mg via ORAL
  Filled 2024-04-27: qty 1

## 2024-04-27 NOTE — Plan of Care (Addendum)
 Patient A&Ox4, VSS on RA. Antibiotic complete. Discharge instructions discussed with patient and spouse. All questions answered. Belongings gathered and with patient/spouse. Volunteer to come and wheel patient out.   Problem: Education: Goal: Ability to describe self-care measures that may prevent or decrease complications (Diabetes Survival Skills Education) will improve Outcome: Adequate for Discharge Goal: Individualized Educational Video(s) Outcome: Adequate for Discharge   Problem: Coping: Goal: Ability to adjust to condition or change in health will improve Outcome: Adequate for Discharge   Problem: Fluid Volume: Goal: Ability to maintain a balanced intake and output will improve Outcome: Adequate for Discharge   Problem: Health Behavior/Discharge Planning: Goal: Ability to identify and utilize available resources and services will improve Outcome: Adequate for Discharge Goal: Ability to manage health-related needs will improve Outcome: Adequate for Discharge   Problem: Metabolic: Goal: Ability to maintain appropriate glucose levels will improve Outcome: Adequate for Discharge   Problem: Nutritional: Goal: Maintenance of adequate nutrition will improve Outcome: Adequate for Discharge Goal: Progress toward achieving an optimal weight will improve Outcome: Adequate for Discharge   Problem: Skin Integrity: Goal: Risk for impaired skin integrity will decrease Outcome: Adequate for Discharge   Problem: Tissue Perfusion: Goal: Adequacy of tissue perfusion will improve Outcome: Adequate for Discharge   Problem: Clinical Measurements: Goal: Ability to maintain clinical measurements within normal limits will improve Outcome: Adequate for Discharge Goal: Will remain free from infection Outcome: Adequate for Discharge Goal: Diagnostic test results will improve Outcome: Adequate for Discharge Goal: Respiratory complications will improve Outcome: Adequate for Discharge Goal:  Cardiovascular complication will be avoided Outcome: Adequate for Discharge   Problem: Education: Goal: Knowledge of General Education information will improve Description: Including pain rating scale, medication(s)/side effects and non-pharmacologic comfort measures Outcome: Adequate for Discharge   Problem: Health Behavior/Discharge Planning: Goal: Ability to manage health-related needs will improve Outcome: Adequate for Discharge   Problem: Activity: Goal: Risk for activity intolerance will decrease Outcome: Adequate for Discharge   Problem: Nutrition: Goal: Adequate nutrition will be maintained Outcome: Adequate for Discharge   Problem: Coping: Goal: Level of anxiety will decrease Outcome: Adequate for Discharge   Problem: Elimination: Goal: Will not experience complications related to bowel motility Outcome: Adequate for Discharge Goal: Will not experience complications related to urinary retention Outcome: Adequate for Discharge   Problem: Safety: Goal: Ability to remain free from injury will improve Outcome: Adequate for Discharge   Problem: Pain Managment: Goal: General experience of comfort will improve and/or be controlled Outcome: Adequate for Discharge   Problem: Skin Integrity: Goal: Risk for impaired skin integrity will decrease Outcome: Adequate for Discharge

## 2024-04-27 NOTE — TOC Transition Note (Signed)
 Transition of Care [facility]) - Discharge Note   Patient Details  Name: Joann [name] MRN: [id number] Date of Birth: Jul 25, 1946  [facility] CM/SW Contact:  Marinda Cooks, RN Phone Number: 04/27/2024, 4:46 PM   Clinical Narrative:    This CM updated by covering MD pt medically cleared to dc today and has active DC order . This CM spoke with [facility] liaison Shaun & confirmed HH was arranged with SOC within within 48 hrs .DC transportation confirmed for pt with husband  Medical team updated . No additional DC needs requested by medical team or identified by CM at this time .     Final next level of care: Home w Home Health Services Barriers to Discharge: No Barriers Identified   Discharge Placement  Home  with [facility] and Home infusion  Name of family member notified: Patient and her Husband Patient and family notified of of transfer: 04/27/24     HH Arranged: RN, PT, OT HH Agency: Other - See comment ([facility]) Date HH Agency Contacted: 04/27/24 Time HH Agency Contacted: 1110 Representative spoke with at [facility] Agency: Shaun  Social Drivers of Health (SDOH) Interventions SDOH Screenings   Food Insecurity: No Food Insecurity (04/24/2024)  Housing: Low Risk  (04/24/2024)  Transportation Needs: No Transportation Needs (04/24/2024)  Utilities: Not At Risk (04/24/2024)  Financial Resource Strain: Low Risk  (03/07/2024)   Received from [facility]  Social Connections: Moderately Integrated (04/24/2024)  Tobacco Use: Low Risk  (04/25/2024)     Readmission Risk Interventions     No data to display

## 2024-04-27 NOTE — Discharge Summary (Addendum)
 Physician Discharge Summary   Patient: Joann [name] MRN: [id number] DOB: [birth year]  Admit date:     04/24/2024  Discharge date: 04/27/24  Discharge Physician: Laree Lock   PCP: Rudolpho Norleen BIRCH, MD   Recommendations at discharge:   Follow up with PCP in 1 week -repeat BMP, Hb  Follow-up with podiatry in 1 week Follow-up with infectious disease on 10/09 Home PT/OT/RN   Discharge Diagnoses: Principal Problem:   Left foot infection Active Problems:   Osteomyelitis of left foot (HCC)   DM2 (diabetes mellitus, type 2) (HCC)   Hypertension   MSSA bacteremia   Hospital Course: 78 year old with history of diabetes, recent MSSA bacteremia, left foot osteomyelitis on IV cefazolin  via PICC line EOT 05/07/2024, HTN, history of TIA who was being followed by podiatrist at outpatient for left foot wound, did not get better and being admitted for further treatment and washout. Hospital course as below  Left foot osteomyelitis/wound failed outpatient treatment - Patient has a history of MSSA and on active cefazolin , EOT 05/07/2024, no change in abx - Seen by Podiatry and ID, appreciate recs - On IV Cefazolin  until 05/07/24 - underwent I&D 10/01, Wcx pending - Touch down heel WB only for transfer on the LEFT lower extremity in post operative shoe - Follow up with Podiatry and ID outpatient  Addendum: discussed with ID and Podiatry, Wcx 10/02 at bedside, Candida parapsilosis and coag neg staph - contaminants - will not treat    Mild drop in Hb, stable - S/p I&D, no evidence of further bleeding - follow up outpatient   Hx TIA - was on asa 21 days (until 10/07) and plavix  75mg  indefinitely   CKD stage 3a/b - Monitor Cr  -- Home PT/OT/RN arranged  Consultants: Podiatry, infectious disease Procedures performed: underwent I&D Left foot 10/01  Disposition: Home health Diet recommendation:  Cardiac and Carb modified diet DISCHARGE MEDICATION: Allergies as of 04/27/2024        Reactions   Neosporin [neomycin-bacitracin Zn-polymyx] Itching   Penicillins Itching   Has patient had a PCN reaction causing immediate rash, facial/tongue/throat swelling, SOB or lightheadedness with hypotension: no Has patient had a PCN reaction causing severe rash involving mucus membranes or skin necrosis: no Has patient had a PCN reaction that required hospitalization no Has patient had a PCN reaction occurring within the last 10 years:no If all of the above answers are NO, then may proceed with Cephalosporin use.   Statins    hallucinations        Medication List     STOP taking these medications    sulfamethoxazole-trimethoprim 800-160 MG tablet Commonly known as: BACTRIM DS       TAKE these medications    ascorbic acid  500 MG tablet Commonly known as: VITAMIN C  Take 1 tablet (500 mg total) by mouth 2 (two) times daily.   aspirin  EC 81 MG tablet Take 1 tablet (81 mg total) by mouth daily for 4 days. Start taking on: April 28, 2024 What changed:  when to take this reasons to take this   CALCIUM  + D PO Take 1 tablet by mouth daily.   ceFAZolin  IVPB Commonly known as: ANCEF  Inject 2 g into the vein every 8 (eight) hours for 11 days. Indication:  MSSA bacteremia and foot infection First Dose: Yes Last Day of Therapy:  05/07/2024 Labs - Once weekly:  CBC/D, CMP, ESR and CRP Fax weekly lab results  promptly to [phone] Method of administration: IV Push Method of administration  may be changed at the discretion of home infusion pharmacist based upon assessment of the patient and/or caregiver's ability to self-administer the medication ordered. Please pull PIC at completion of IV antibiotics Call [phone] with any questions or critical value   clopidogrel  75 MG tablet Commonly known as: PLAVIX  Take 1 tablet (75 mg total) by mouth daily.   feeding supplement Liqd Take 237 mLs by mouth 3 (three) times daily between meals.    hydrochlorothiazide  12.5 MG tablet Commonly known as: HYDRODIURIL  Take 12.5 mg by mouth daily.   metFORMIN  500 MG tablet Commonly known as: GLUCOPHAGE  Take 500 mg by mouth 2 (two) times daily.   multivitamin with minerals Tabs tablet Take 1 tablet by mouth daily.   omega-3 acid ethyl esters 1 g capsule Commonly known as: LOVAZA  Take 1 capsule (1 g total) by mouth daily.   zinc  sulfate (50mg  elemental zinc ) 220 (50 Zn) MG capsule Take 1 capsule (220 mg total) by mouth daily.               Discharge Care Instructions  (From admission, onward)           Start     Ordered   04/27/24 0000  Discharge wound care:       Comments: Clean area with wound care cleanser, pat dry, Packing to the LEFT wound with packing strips. Paint surrounding wound with betadine , cover with 4x4 gauze, ABD, and anchor all dressings with loosely placed kerlix/ ACE.   Supplement Reccs: Okay to reinforce with dry gauze / loose ACE overlap prn strikethrough. Please leave underlayer intact   04/27/24 1328            Follow-up Information     Tanda Greig MATSU, DPM Follow up.   Specialty: Podiatry Contact information: [address] [place] [address] [phone]                Discharge Exam: Fredricka Weights   04/24/24 1145  Weight: 65.3 kg    Constitutional: Alert, awake, calm, comfortable Respiratory: Clear to auscultation B/L, no wheezing, no rales.  Cardiovascular: Regular rate and rhythm, no murmurs / rubs / gallops. No extremity edema Abdomen: Soft, no tenderness, Bowel sounds positive Musculoskeletal: Left foot surgical dressing Skin: Left foot plantar aspect has a nonhealing wound Neurologic: No gross focal deficits  Condition at discharge: good  The results of significant diagnostics from this hospitalization (including imaging, microbiology, ancillary and laboratory) are listed below for reference.   Imaging Studies: CT FOOT LEFT WO CONTRAST Result Date:  04/26/2024 CLINICAL DATA:  Plantar foot wound status post incision and drainage on 04/25/2024. History of prior partial metatarsal amputations. EXAM: CT OF THE LEFT FOOT WITHOUT CONTRAST TECHNIQUE: Multidetector CT imaging of the left foot was performed according to the standard protocol. Multiplanar CT image reconstructions were also generated. RADIATION DOSE REDUCTION: This exam was performed according to the departmental dose-optimization program which includes automated exposure control, adjustment of the mA and/or kV according to patient size and/or use of iterative reconstruction technique. COMPARISON:  MRI of the left foot dated 04/08/2024. FINDINGS: Soft tissue Status post incision and drainage of a soft tissue wound along the plantar lateral aspect of the forefoot, near the level of the fifth metatarsal amputation, with soft tissue defect/wound extending to the plantar margin of the mid to distal fifth metatarsal, just proximal to the transsection margin. No discrete loculated fluid collection. There is surrounding cutaneous and soft tissue thickening with edema. Muscles and Tendons Chronic  atrophy of the intrinsic foot musculature. Postoperative changes related to prior partial second ray amputation and partial amputation of the fifth metatarsal and base of the fifth proximal phalanx. No discrete loculated intramuscular fluid collection identified. Bones/Joint/Cartilage Postoperative changes related to partial second ray amputation at the level of the second metatarsal neck and amputation of the distal fifth metatarsal and the base of the fifth proximal phalanx. No definite acute osteolysis identified. No acute fracture or dislocation. Unchanged chronic flattening of third metatarsal head. Moderate degenerative arthropathy of the midfoot, most pronounced at the second and third TMT joints and the navicular-cuneiform articulation. Diffuse interphalangeal joint space narrowing. Mild osteoarthritis of the  first MTP joint. Calcaneal enthesopathy at the insertion of the Achilles tendon and the origin of the central cord of the plantar fascia. Ligaments Ligaments are suboptimally evaluated by CT. IMPRESSION: 1. Status post incision and drainage of a soft tissue wound along the plantar lateral aspect of the forefoot, near the level of the fifth metatarsal amputation, with soft tissue defect/wound extending to the plantar margin of the mid to distal fifth metatarsal, just proximal to the transsection margin. No definite acute osteolysis identified. No discrete loculated fluid collection. Surrounding cutaneous and soft tissue thickening and edema may reflect postsurgical change, however, cellulitis can not be excluded. 2. Postoperative changes related to partial second ray amputation at the level of the second metatarsal neck and amputation of the distal fifth metatarsal and the base of the fifth proximal phalanx. 3. Moderate degenerative arthropathy of the midfoot. Electronically Signed   By: Harrietta Sherry M.D.   On: 04/26/2024 11:18   DG MINI C-ARM IMAGE ONLY Result Date: 04/25/2024 There is no interpretation for this exam.  This order is for images obtained during a surgical procedure.  Please See Surgeries Tab for more information regarding the procedure.   US  EKG SITE RITE Result Date: 04/12/2024 If Site Rite image not attached, placement could not be confirmed due to current cardiac rhythm.  US  EKG SITE RITE Result Date: 04/12/2024 If Site Rite image not attached, placement could not be confirmed due to current cardiac rhythm.  ECHOCARDIOGRAM COMPLETE Result Date: 04/10/2024    ECHOCARDIOGRAM REPORT   Patient Name:   WREN PRYCE Date of Exam: 04/10/2024 Medical Rec #:  [id number]             Height:       63.0 in Accession #:    [id number]            Weight:       156.5 lb Date of Birth:  [birth year]             BSA:          1.742 m Patient Age:    78 years              BP:           98/60  mmHg Patient Gender: F                     HR:           70 bpm. Exam Location:  [facility] Procedure: 2D Echo, Color Doppler, Cardiac Doppler, Strain Analysis and Saline            Contrast Bubble Study (Both Spectral and Color Flow Doppler were            utilized during procedure). Indications:     Stroke I63.9  History:  Patient has no prior history of Echocardiogram examinations.                  Risk Factors:Diabetes and Hypertension.  Sonographer:     Christopher Furnace Referring Phys:  [id number] ANDREW C CORE Diagnosing Phys: Annalee Custovic  Sonographer Comments: Global longitudinal strain was attempted. IMPRESSIONS  1. Left ventricular ejection fraction, by estimation, is 60 to 65%. The left ventricle has normal function. The left ventricle has no regional wall motion abnormalities. Left ventricular diastolic parameters are consistent with Grade I diastolic dysfunction (impaired relaxation).  2. Right ventricular systolic function is normal. The right ventricular size is normal.  3. The mitral valve is normal in structure. Mild to moderate mitral valve regurgitation. No evidence of mitral stenosis.  4. The aortic valve is normal in structure. Aortic valve regurgitation is not visualized. No aortic stenosis is present.  5. The inferior vena cava is normal in size with greater than 50% respiratory variability, suggesting right atrial pressure of 3 mmHg.  6. Agitated saline contrast bubble study was negative, with no evidence of any interatrial shunt. FINDINGS  Left Ventricle: Left ventricular ejection fraction, by estimation, is 60 to 65%. The left ventricle has normal function. The left ventricle has no regional wall motion abnormalities. The left ventricular internal cavity size was normal in size. There is  no left ventricular hypertrophy. Left ventricular diastolic parameters are consistent with Grade I diastolic dysfunction (impaired relaxation). Right Ventricle: The right ventricular size is normal. No increase  in right ventricular wall thickness. Right ventricular systolic function is normal. Left Atrium: Left atrial size was normal in size. Right Atrium: Right atrial size was normal in size. Pericardium: There is no evidence of pericardial effusion. Mitral Valve: The mitral valve is normal in structure. Mild to moderate mitral valve regurgitation. No evidence of mitral valve stenosis. MV peak gradient, 8.5 mmHg. The mean mitral valve gradient is 2.0 mmHg. Tricuspid Valve: The tricuspid valve is normal in structure. Tricuspid valve regurgitation is mild. Aortic Valve: The aortic valve is normal in structure. Aortic valve regurgitation is not visualized. No aortic stenosis is present. Aortic valve mean gradient measures 3.0 mmHg. Aortic valve peak gradient measures 6.5 mmHg. Aortic valve area, by VTI measures 2.14 cm. Pulmonic Valve: The pulmonic valve was normal in structure. Pulmonic valve regurgitation is not visualized. Aorta: The aortic root is normal in size and structure. Venous: The inferior vena cava is normal in size with greater than 50% respiratory variability, suggesting right atrial pressure of 3 mmHg. IAS/Shunts: No atrial level shunt detected by color flow Doppler. Agitated saline contrast was given intravenously to evaluate for intracardiac shunting. Agitated saline contrast bubble study was negative, with no evidence of any interatrial shunt.  LEFT VENTRICLE PLAX 2D LVIDd:         4.50 cm   Diastology LVIDs:         3.10 cm   LV e' medial:    9.14 cm/s LV PW:         1.20 cm   LV E/e' medial:  11.4 LV IVS:        0.70 cm   LV e' lateral:   10.30 cm/s LVOT diam:     2.00 cm   LV E/e' lateral: 10.1 LV SV:         69 LV SV Index:   39 LVOT Area:     3.14 cm  RIGHT VENTRICLE RV Basal diam:  4.00 cm RV Mid diam:  3.60 cm LEFT ATRIUM             Index        RIGHT ATRIUM           Index LA diam:        3.30 cm 1.89 cm/m   RA Area:     14.90 cm LA Vol (A2C):   44.0 ml 25.25 ml/m  RA Volume:   41.80 ml   23.99 ml/m LA Vol (A4C):   60.9 ml 34.95 ml/m LA Biplane Vol: 54.8 ml 31.45 ml/m  AORTIC VALVE AV Area (Vmax):    2.33 cm AV Area (Vmean):   2.00 cm AV Area (VTI):     2.14 cm AV Vmax:           127.00 cm/s AV Vmean:          85.000 cm/s AV VTI:            0.321 m AV Peak Grad:      6.5 mmHg AV Mean Grad:      3.0 mmHg LVOT Vmax:         94.30 cm/s LVOT Vmean:        54.000 cm/s LVOT VTI:          0.219 m LVOT/AV VTI ratio: 0.68  AORTA Ao Root diam: 2.20 cm MITRAL VALVE                TRICUSPID VALVE MV Area (PHT): 4.86 cm     TR Peak grad:   19.5 mmHg MV Area VTI:   1.85 cm     TR Vmax:        221.00 cm/s MV Peak grad:  8.5 mmHg MV Mean grad:  2.0 mmHg     SHUNTS MV Vmax:       1.46 m/s     Systemic VTI:  0.22 m MV Vmean:      61.9 cm/s    Systemic Diam: 2.00 cm MV Decel Time: 156 msec MV E velocity: 104.00 cm/s MV A velocity: 66.00 cm/s MV E/A ratio:  1.58 Designer, multimedia signed by Annalee Casa Signature Date/Time: 04/10/2024/12:34:42 PM    Final    MR FOOT LEFT WO CONTRAST Result Date: 04/09/2024 CLINICAL DATA:  Osteomyelitis, foot Plantar foot wound.  History of partial metatarsal amputations. EXAM: MRI OF THE LEFT FOOT WITHOUT CONTRAST TECHNIQUE: Multiplanar, multisequence MR imaging of the left forefoot was performed. No intravenous contrast was administered. COMPARISON:  Radiographs 04/08/2024 and 10/15/2016. MRI of the left forefoot 02/21/2019 and 12/11/2019. FINDINGS: Bones/Joint/Cartilage Postsurgical changes from amputation of the 2nd metatarsal at the metatarsal neck are unchanged from the previous MRI. Interval resection of the distal 5th metatarsal and base of the 5th proximal phalanx. No highly suspicious marrow signal abnormalities within the remaining 5th ray to suggest recurrent osteomyelitis. There is T2 hyperintensity within the proximal 2nd metatarsal without cortical destruction, possibly stress related. Nearby subcortical cyst formation in the 2nd and 3rd metatarsal  bases and adjacent cuneiform bones. No evidence of acute fracture or dislocation. No significant joint effusions are identified. There are moderate midfoot degenerative changes and stable mild chronic flattening of the 3rd metatarsal head. Ligaments Intact Lisfranc ligament. The collateral ligaments of the remaining metatarsophalangeal joints appear intact. Muscles and Tendons Chronic fatty forefoot muscular atrophy, similar to previous study. No focal intramuscular fluid collection or acute tendon rupture identified. The abductor digiti minimi tendon is discontinuous at the level of the distal 5th metatarsal, likely  related to previous amputation. Soft tissues Soft tissue wound along the plantar lateral aspect of the forefoot at the level of the 5th metatarsal amputation. There is an adjacent focal fluid collection along the discontinuous abductor digiti minimi tendon which measures approximately 1.7 x 0.9 x 1.5 cm. There are surrounding soft tissue inflammatory changes without other organized fluid collections. IMPRESSION: 1. Soft tissue wound along the plantar lateral aspect of the forefoot at the level of the 5th metatarsal amputation with adjacent focal fluid collection suspicious for a small abscess. 2. No specific evidence of recurrent osteomyelitis at this time. Postsurgical changes in the 5th ray as described. 3. T2 hyperintensity within the proximal 2nd metatarsal without cortical destruction, possibly stress related. Stable postsurgical changes from amputation of the 2nd metatarsal at the metatarsal neck. 4. Chronic fatty forefoot muscular atrophy. Electronically Signed   By: Elsie Perone M.D.   On: 04/09/2024 09:57   MR BRAIN WO CONTRAST Addendum Date: 04/08/2024 ADDENDUM REPORT: 04/08/2024 23:00 ADDENDUM: Please note that this examination was inadvertently signed off prior to completing the initial dictation. The following addendum includes findings for the MRA portions of the head and neck as  well as the overall impression for these studies. MRA HEAD FINDINGS: ANTERIOR CIRCULATION: Right ICA widely patent the siphon without stenosis. Irregular attenuated intermittent flow seen within the visualized distal cervical left ICA, which largely occludes at the skull base. Attenuated distal reconstitution at the left carotid siphon the of the left ophthalmic artery. Right A1 segment patent. Left A1 attenuated but patent. Normal anterior communicating complex. Both ACAs patent without visible stenosis. Right M1 segment widely patent. Atheromatous irregularity about proximal M2 branch with mild stenoses, inferior division (series 1026, image 6). Right MCA branches otherwise widely patent. Attenuated but patent flow within the left M1 segment and distal left MCA branches. POSTERIOR CIRCULATION: Visualized vertebral arteries patent without stenosis. Left vertebral artery dominant. Both PICA patent at their origins. Basilar patent without stenosis. Superior cerebellar arteries patent bilaterally. Both PCAs primarily supplied via the basilar. Mild atheromatous irregularity about the PCAs with associated mild bilateral P2 stenoses. PCAs remain patent to their distal aspects. No intracranial aneurysm. MRA NECK FINDINGS: AORTIC ARCH: Examination mildly degraded by motion and lack of IV contrast. Visualized aortic arch within normal limits for caliber standard branch pattern. No visible stenosis about the origin of the great vessels. RIGHT CAROTID SYSTEM: Right common and internal carotid arteries are patent with antegrade flow. No visible dissection. Atheromatous change about the right carotid bulb/proximal right ICA with associated stenosis of up to 60 percent by NASCET criteria. LEFT CAROTID SYSTEM: Left CCA grossly patent at its origin, and remains grossly patent to the bifurcation. Left ICA appears occluded at its origin at the bifurcation. Persisted perfusion within the left ECA and its major branches. VERTEBRAL  ARTERIES: Both vertebral arteries arise from subclavian arteries. Left vertebral artery dominant. Vertebral arteries are patent with antegrade flow. No visible to stenosis or dissection. Other: None. IMPRESSION: MRI HEAD IMPRESSION: 1. No acute intracranial abnormality. 2. 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 3. Few small meningiomas measuring up to 1.1 cm along the falx and overlying the left frontal convexity as above. No associated mass effect or edema. MRA HEAD IMPRESSION: 1. Loss of normal flow void within the left ICA to the siphon, consistent with occlusion as seen on corresponding MRA of the neck. Distal reconstitution at the left siphon with patent but attenuated flow  within the left MCA distribution. 2. Otherwise wide patency of the intracranial arterial circulation. No other hemodynamically significant or correctable stenosis. MRA NECK IMPRESSION: 1. 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 Left ICA remains largely occluded to the skull base. 2. Atheromatous change about the right carotid bulb/proximal right ICA with associated stenosis of up to 60 percent by NASCET criteria. 3. Wide patency of both vertebral arteries within the neck. Left vertebral artery dominant. Electronically Signed   By: Morene Hoard M.D.   On: 04/08/2024 23:00   Result Date: 04/08/2024 CLINICAL DATA:  Initial evaluation for acute neuro deficit, stroke suspected. EXAM: MRI HEAD WITHOUT CONTRAST MRA HEAD WITHOUT CONTRAST MRA NECK WITHOUT CONTRAST TECHNIQUE: Multiplanar, multiecho pulse sequences of the brain and surrounding structures were obtained without intravenous contrast. Angiographic images of the Circle of Willis were obtained using MRA technique without intravenous contrast. Angiographic images of the neck were obtained using MRA technique without intravenous contrast. Carotid stenosis  measurements (when applicable) are obtained utilizing NASCET criteria, using the distal internal carotid diameter as the denominator. COMPARISON:  None Available. FINDINGS: MRI HEAD FINDINGS Brain: Diffuse prominence of the CSF containing spaces compatible generalized cerebral atrophy. Patchy T2/FLAIR hyperintensity involving the periventricular deep white matter both cerebral hemispheres, consistent with chronic small vessel ischemic disease, mild-to-moderate in nature. Remote lacunar infarct present at the anterior right basal ganglia. Probable small remote right parietal infarct (series 12, image 32). No evidence for acute or subacute infarct. Gray-white matter adjacent maintained. No acute or chronic intracranial hemorrhage. 1.1 cm meningioma present along the falx without significant mass effect or edema (series 6, image 18). Additional small 1 cm meningioma overlies the left frontal convexity without mass effect or edema (series 6, image 17). No other mass lesion, mass effect, or midline shift. No hydrocephalus or extra-axial fluid collection. Vascular: Loss of normal flow void within the left ICA to the siphon, consistent with slow flow and/or occlusion. Major intracranial vascular flow voids are otherwise maintained. Skull and upper cervical spine: Cranial junction within normal limits. Bone marrow signal intensity overall within normal limits. No scalp soft tissue abnormality. Sinuses/Orbits: Globes orbital soft tissues within normal limits. Paranasal sinuses are largely clear. No mastoid effusion. Other: None. MRA HEAD FINDINGS ANTERIOR CIRCULATION: POSTERIOR CIRCULATION: MRA NECK FINDINGS Electronically Signed: By: Morene Hoard M.D. On: 04/08/2024 22:32   MR ANGIO HEAD WO CONTRAST Addendum Date: 04/08/2024 ADDENDUM REPORT: 04/08/2024 23:00 ADDENDUM: Please note that this examination was inadvertently signed off prior to completing the initial dictation. The following addendum includes findings  for the MRA portions of the head and neck as well as the overall impression for these studies. MRA HEAD FINDINGS: ANTERIOR CIRCULATION: Right ICA widely patent the siphon without stenosis. Irregular attenuated intermittent flow seen within the visualized distal cervical left ICA, which largely occludes at the skull base. Attenuated distal reconstitution at the left carotid siphon the of the left ophthalmic artery. Right A1 segment patent. Left A1 attenuated but patent. Normal anterior communicating complex. Both ACAs patent without visible stenosis. Right M1 segment widely patent. Atheromatous irregularity about proximal M2 branch with mild stenoses, inferior division (series 1026, image 6). Right MCA branches otherwise widely patent. Attenuated but patent flow within the left M1 segment and distal left MCA branches. POSTERIOR CIRCULATION: Visualized vertebral arteries patent without stenosis. Left vertebral artery dominant. Both PICA patent at their origins. Basilar patent without stenosis. Superior cerebellar arteries patent bilaterally. Both PCAs primarily supplied via the basilar. Mild atheromatous irregularity  about the PCAs with associated mild bilateral P2 stenoses. PCAs remain patent to their distal aspects. No intracranial aneurysm. MRA NECK FINDINGS: AORTIC ARCH: Examination mildly degraded by motion and lack of IV contrast. Visualized aortic arch within normal limits for caliber standard branch pattern. No visible stenosis about the origin of the great vessels. RIGHT CAROTID SYSTEM: Right common and internal carotid arteries are patent with antegrade flow. No visible dissection. Atheromatous change about the right carotid bulb/proximal right ICA with associated stenosis of up to 60 percent by NASCET criteria. LEFT CAROTID SYSTEM: Left CCA grossly patent at its origin, and remains grossly patent to the bifurcation. Left ICA appears occluded at its origin at the bifurcation. Persisted perfusion within the  left ECA and its major branches. VERTEBRAL ARTERIES: Both vertebral arteries arise from subclavian arteries. Left vertebral artery dominant. Vertebral arteries are patent with antegrade flow. No visible to stenosis or dissection. Other: None. IMPRESSION: MRI HEAD IMPRESSION: 1. No acute intracranial abnormality. 2. 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 3. Few small meningiomas measuring up to 1.1 cm along the falx and overlying the left frontal convexity as above. No associated mass effect or edema. MRA HEAD IMPRESSION: 1. Loss of normal flow void within the left ICA to the siphon, consistent with occlusion as seen on corresponding MRA of the neck. Distal reconstitution at the left siphon with patent but attenuated flow within the left MCA distribution. 2. Otherwise wide patency of the intracranial arterial circulation. No other hemodynamically significant or correctable stenosis. MRA NECK IMPRESSION: 1. 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 Left ICA remains largely occluded to the skull base. 2. Atheromatous change about the right carotid bulb/proximal right ICA with associated stenosis of up to 60 percent by NASCET criteria. 3. Wide patency of both vertebral arteries within the neck. Left vertebral artery dominant. Electronically Signed   By: Morene Hoard M.D.   On: 04/08/2024 23:00   Result Date: 04/08/2024 CLINICAL DATA:  Initial evaluation for acute neuro deficit, stroke suspected. EXAM: MRI HEAD WITHOUT CONTRAST MRA HEAD WITHOUT CONTRAST MRA NECK WITHOUT CONTRAST TECHNIQUE: Multiplanar, multiecho pulse sequences of the brain and surrounding structures were obtained without intravenous contrast. Angiographic images of the Circle of Willis were obtained using MRA technique without intravenous contrast. Angiographic images of the neck were obtained using MRA technique without  intravenous contrast. Carotid stenosis measurements (when applicable) are obtained utilizing NASCET criteria, using the distal internal carotid diameter as the denominator. COMPARISON:  None Available. FINDINGS: MRI HEAD FINDINGS Brain: Diffuse prominence of the CSF containing spaces compatible generalized cerebral atrophy. Patchy T2/FLAIR hyperintensity involving the periventricular deep white matter both cerebral hemispheres, consistent with chronic small vessel ischemic disease, mild-to-moderate in nature. Remote lacunar infarct present at the anterior right basal ganglia. Probable small remote right parietal infarct (series 12, image 32). No evidence for acute or subacute infarct. Gray-white matter adjacent maintained. No acute or chronic intracranial hemorrhage. 1.1 cm meningioma present along the falx without significant mass effect or edema (series 6, image 18). Additional small 1 cm meningioma overlies the left frontal convexity without mass effect or edema (series 6, image 17). No other mass lesion, mass effect, or midline shift. No hydrocephalus or extra-axial fluid collection. Vascular: Loss of normal flow void within the left ICA to the siphon, consistent with slow flow and/or occlusion. Major intracranial vascular flow voids are otherwise maintained. Skull and upper cervical spine: Cranial junction within  normal limits. Bone marrow signal intensity overall within normal limits. No scalp soft tissue abnormality. Sinuses/Orbits: Globes orbital soft tissues within normal limits. Paranasal sinuses are largely clear. No mastoid effusion. Other: None. MRA HEAD FINDINGS ANTERIOR CIRCULATION: POSTERIOR CIRCULATION: MRA NECK FINDINGS Electronically Signed: By: Morene Hoard M.D. On: 04/08/2024 22:32   MR ANGIO NECK WO CONTRAST Addendum Date: 04/08/2024 ADDENDUM REPORT: 04/08/2024 23:00 ADDENDUM: Please note that this examination was inadvertently signed off prior to completing the initial dictation.  The following addendum includes findings for the MRA portions of the head and neck as well as the overall impression for these studies. MRA HEAD FINDINGS: ANTERIOR CIRCULATION: Right ICA widely patent the siphon without stenosis. Irregular attenuated intermittent flow seen within the visualized distal cervical left ICA, which largely occludes at the skull base. Attenuated distal reconstitution at the left carotid siphon the of the left ophthalmic artery. Right A1 segment patent. Left A1 attenuated but patent. Normal anterior communicating complex. Both ACAs patent without visible stenosis. Right M1 segment widely patent. Atheromatous irregularity about proximal M2 branch with mild stenoses, inferior division (series 1026, image 6). Right MCA branches otherwise widely patent. Attenuated but patent flow within the left M1 segment and distal left MCA branches. POSTERIOR CIRCULATION: Visualized vertebral arteries patent without stenosis. Left vertebral artery dominant. Both PICA patent at their origins. Basilar patent without stenosis. Superior cerebellar arteries patent bilaterally. Both PCAs primarily supplied via the basilar. Mild atheromatous irregularity about the PCAs with associated mild bilateral P2 stenoses. PCAs remain patent to their distal aspects. No intracranial aneurysm. MRA NECK FINDINGS: AORTIC ARCH: Examination mildly degraded by motion and lack of IV contrast. Visualized aortic arch within normal limits for caliber standard branch pattern. No visible stenosis about the origin of the great vessels. RIGHT CAROTID SYSTEM: Right common and internal carotid arteries are patent with antegrade flow. No visible dissection. Atheromatous change about the right carotid bulb/proximal right ICA with associated stenosis of up to 60 percent by NASCET criteria. LEFT CAROTID SYSTEM: Left CCA grossly patent at its origin, and remains grossly patent to the bifurcation. Left ICA appears occluded at its origin at the  bifurcation. Persisted perfusion within the left ECA and its major branches. VERTEBRAL ARTERIES: Both vertebral arteries arise from subclavian arteries. Left vertebral artery dominant. Vertebral arteries are patent with antegrade flow. No visible to stenosis or dissection. Other: None. IMPRESSION: MRI HEAD IMPRESSION: 1. No acute intracranial abnormality. 2. 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 3. Few small meningiomas measuring up to 1.1 cm along the falx and overlying the left frontal convexity as above. No associated mass effect or edema. MRA HEAD IMPRESSION: 1. Loss of normal flow void within the left ICA to the siphon, consistent with occlusion as seen on corresponding MRA of the neck. Distal reconstitution at the left siphon with patent but attenuated flow within the left MCA distribution. 2. Otherwise wide patency of the intracranial arterial circulation. No other hemodynamically significant or correctable stenosis. MRA NECK IMPRESSION: 1. 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 Left ICA remains largely occluded to the skull base. 2. Atheromatous change about the right carotid bulb/proximal right ICA with associated stenosis of up to 60 percent by NASCET criteria. 3. Wide patency of both vertebral arteries within the neck. Left vertebral artery dominant. Electronically Signed   By: Morene Hoard M.D.   On: 04/08/2024 23:00   Result Date: 04/08/2024 CLINICAL DATA:  Initial  evaluation for acute neuro deficit, stroke suspected. EXAM: MRI HEAD WITHOUT CONTRAST MRA HEAD WITHOUT CONTRAST MRA NECK WITHOUT CONTRAST TECHNIQUE: Multiplanar, multiecho pulse sequences of the brain and surrounding structures were obtained without intravenous contrast. Angiographic images of the Circle of Willis were obtained using MRA technique without intravenous contrast. Angiographic images of the neck  were obtained using MRA technique without intravenous contrast. Carotid stenosis measurements (when applicable) are obtained utilizing NASCET criteria, using the distal internal carotid diameter as the denominator. COMPARISON:  None Available. FINDINGS: MRI HEAD FINDINGS Brain: Diffuse prominence of the CSF containing spaces compatible generalized cerebral atrophy. Patchy T2/FLAIR hyperintensity involving the periventricular deep white matter both cerebral hemispheres, consistent with chronic small vessel ischemic disease, mild-to-moderate in nature. Remote lacunar infarct present at the anterior right basal ganglia. Probable small remote right parietal infarct (series 12, image 32). No evidence for acute or subacute infarct. Gray-white matter adjacent maintained. No acute or chronic intracranial hemorrhage. 1.1 cm meningioma present along the falx without significant mass effect or edema (series 6, image 18). Additional small 1 cm meningioma overlies the left frontal convexity without mass effect or edema (series 6, image 17). No other mass lesion, mass effect, or midline shift. No hydrocephalus or extra-axial fluid collection. Vascular: Loss of normal flow void within the left ICA to the siphon, consistent with slow flow and/or occlusion. Major intracranial vascular flow voids are otherwise maintained. Skull and upper cervical spine: Cranial junction within normal limits. Bone marrow signal intensity overall within normal limits. No scalp soft tissue abnormality. Sinuses/Orbits: Globes orbital soft tissues within normal limits. Paranasal sinuses are largely clear. No mastoid effusion. Other: None. MRA HEAD FINDINGS ANTERIOR CIRCULATION: POSTERIOR CIRCULATION: MRA NECK FINDINGS Electronically Signed: By: Morene Hoard M.D. On: 04/08/2024 22:32   US  RENAL Result Date: 04/08/2024 CLINICAL DATA:  Acute renal injury EXAM: RENAL / URINARY TRACT ULTRASOUND COMPLETE COMPARISON:  None Available. FINDINGS: Right  Kidney: Renal measurements: 8.6 x 5.6 x 5.1 cm. = volume: 128 mL. Echogenicity within normal limits. No mass or hydronephrosis visualized. Left Kidney: Renal measurements: 10.2 x 5.1 x 5.4 cm. = volume: 149 mL. Echogenicity within normal limits. No mass or hydronephrosis visualized. Bladder: Appears normal for degree of bladder distention. Other: Mild splenomegaly is noted IMPRESSION: Unremarkable kidneys. Mild splenomegaly. Electronically Signed   By: Oneil Devonshire M.D.   On: 04/08/2024 20:37   US  Abdomen Limited RUQ (LIVER/GB) Result Date: 04/08/2024 CLINICAL DATA:  Elevated liver enzymes. EXAM: ULTRASOUND ABDOMEN LIMITED RIGHT UPPER QUADRANT COMPARISON:  None Available. FINDINGS: Gallbladder: No gallstones or wall thickening visualized. No sonographic Murphy sign noted by sonographer. Apparent tiny nonshadowing and nonmobile nodules measure up to 5 mm, likely polyps. Common bile duct: Diameter: 5 mm Liver: There is diffuse increased liver echogenicity most commonly seen in the setting of fatty infiltration. Superimposed inflammation or fibrosis is not excluded. Clinical correlation is recommended. Portal vein is patent on color Doppler imaging with normal direction of blood flow towards the liver. Other: None. IMPRESSION: 1. No gallstone. 2. Tiny gallbladder polyps measure up to 5 mm. 3. Fatty liver. Electronically Signed   By: Vanetta Chou M.D.   On: 04/08/2024 20:35   DG Foot Complete Left Result Date: 04/08/2024 CLINICAL DATA:  Plantar wound. EXAM: LEFT FOOT - COMPLETE 3+ VIEW COMPARISON:  Left foot radiograph dated 10/15/2016. 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 No acute fracture or dislocation. The bones  are osteopenic. There is ulceration of the plantar skin at the fifth metatarsal. IMPRESSION: 1. No acute fracture or dislocation. 2. Plantar skin ulceration. Electronically Signed   By: Vanetta Chou M.D.   On: 04/08/2024 17:15   CT HEAD CODE STROKE WO CONTRAST Result Date: 04/08/2024 EXAM: CT HEAD WITHOUT CONTRAST 04/08/2024 02:51:00 PM TECHNIQUE: CT of the head was performed without the administration of intravenous contrast. Automated exposure control, iterative reconstruction, and/or weight based adjustment of the mA/kV was utilized to reduce the radiation dose to as low as reasonably achievable. COMPARISON: None available. CLINICAL HISTORY: Neuro deficit, acute, stroke suspected. Mental status change. FINDINGS: BRAIN AND VENTRICLES: A remote infarct is present in the anterior right frontal lobe with Wallerian degeneration extending inferiorly. Mild atrophy and white matter changes are present bilaterally. No acute hemorrhage. No evidence of acute infarct. No hydrocephalus. No extra-axial collection. No mass effect or midline shift. ORBITS: No acute abnormality. SINUSES: No acute abnormality. SOFT TISSUES AND SKULL: No acute soft tissue abnormality. No skull fracture. VASCULATURE: Atherosclerotic calcifications are present in the cavernous carotid arteries bilaterally. No hyperdense vessel is present. IMPRESSION: 1. No acute intracranial abnormality. 2. 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 3. Mild atrophy and white matter changes bilaterally. 4. Atherosclerotic calcifications in the cavernous carotid arteries bilaterally. Findings were communicated to Dr Matthews via the Am[employer] system at 2:58 pm Electronically signed by: Lonni Necessary MD 04/08/2024 03:00 PM EDT RP Workstation: [id number]    Microbiology: Results for orders placed or performed during the hospital encounter of 04/24/24  MRSA Next Gen by PCR, Nasal     Status: None   Collection Time: 04/25/24  4:30 AM   Specimen: Nasal Mucosa; Nasal Swab  Result Value Ref Range Status   MRSA by PCR Next Gen NOT DETECTED NOT DETECTED Final    Comment: (NOTE) The GeneXpert  MRSA Assay (FDA approved for NASAL specimens only), is one component of a comprehensive MRSA colonization surveillance program. It is not intended to diagnose MRSA infection nor to guide or monitor treatment for MRSA infections. Test performance is not FDA approved in patients less than 10 years old. Performed at [facility], [address] [place], [place] [address]   Aerobic/Anaerobic Culture w Gram Stain (surgical/deep wound)     Status: None (Preliminary result)   Collection Time: 04/26/24  7:21 PM   Specimen: Wound  Result Value Ref Range Status   Specimen Description   Final    WOUND Performed at [facility], [address], [place], [place] [address]    Special Requests   Final     LEFT FOOT Performed at [facility], [address] [place], [place] [address]    Gram Stain   Final    RARE WBC PRESENT, PREDOMINANTLY PMN RARE GRAM POSITIVE COCCI    Culture   Final    TOO YOUNG TO READ Performed at [facility], [address] [place], [place] [address]    Report Status PENDING  Incomplete    Labs: CBC: Recent Labs  Lab 04/24/24 1147 04/24/24 1730 04/25/24 0637 04/26/24 0434 04/27/24 0501  WBC 8.2 5.9 5.1 4.7  --   NEUTROABS 5.7  --   --   --   --   HGB 11.1* 10.3* 10.2* 9.4* 9.5*  HCT 34.0* 31.3* 30.4* 28.8*  --   MCV 89.9 88.4 88.6 90.9  --   PLT 279 226 230 184  --  Basic Metabolic Panel: Recent Labs  Lab 04/24/24 1147 04/24/24 1730 04/25/24 0637 04/26/24 0434  NA 138  --  137 135  K 4.3  --  4.3 4.2  CL 102  --  105 105  CO2 22  --  26 24  GLUCOSE 92  --  93 80  BUN 28*  --  24* 19  CREATININE 1.36* 1.41* 1.26* 1.19*  CALCIUM  9.6  --  8.7* 8.5*   Liver Function Tests: Recent Labs  Lab 04/24/24 1147 04/25/24 0637  AST 58* 44*  ALT 10 9  ALKPHOS 116 106  BILITOT 0.8 0.9  PROT 6.7 6.0*  ALBUMIN 2.8* 2.6*   CBG: Recent Labs  Lab 04/26/24 1158 04/26/24 1713 04/26/24 2141 04/27/24 0746  04/27/24 1123  GLUCAP 90 82 87 82 101*    Discharge time spent: greater than 30 minutes.  Signed: Laree Lock, MD [facility] 04/27/2024

## 2024-04-27 NOTE — Care Management Important Message (Signed)
 Important Message  Patient Details  Name: Joann [name] MRN: [id number] Date of Birth: 11/08/1945   Important Message Given:  Yes - Medicare IM     Lennin Osmond W, CMA 04/27/2024, 11:36 AM

## 2024-04-27 NOTE — Progress Notes (Signed)
 PODIATRY: PROGRESS NOTE    Surgery:  Procedure(s) (LRB): IRRIGATION AND DEBRIDEMENT FOOT (Left) POD:  2 Days Post-Op  O/N: NAEON  Subjective:  Patient resting comfortably at bedside.  Relays that her appetite has returned.  Has gotten up several times to use commode.  Denies F/C/N/V/SOB/CP. Denies acute calf pain.    PHYSICAL EXAMINATION: BP 124/61 (BP Location: Left Arm)   Pulse 63   Temp 97.9 F (36.6 C) (Oral)   Resp 18   Ht 5' 2 (1.575 m)   Wt 65.3 kg   SpO2 93%   BMI 26.34 kg/m ? GEN: NAD. AOX3. ? RESP: Non-labored breathing on RA.? ABD: NT/ND of all four quadrants.? NEURO: Moving all four extremities spontaneously. ? ? FOCUSED LOWER EXTREMITY EXAMINATION:? NEURO: ? - Sensation to light touch diminished to tib/saph/dp/sp/sural nerve distributions. ? - No paresthesias elicited on examination. ??  VASCULAR: ? - DP/PT 1 - sluggish<3 seconds. ? - Focal edema noted adjacent to surgical site ; as expected for post-operative state. ??  MSK: ? - TTP none - No calf tenderness. ? - Negative Homan's maneuver.  ? - s/p 2nd digit amputation  - s/p 5th partial ray resection?  DERM: ? - Dressings c/d/I. ?no strikethrough  - Surgical site well coapted. ? - No clinical signs of dehiscence noted - No proximal streaking. No malodor. ?  #WOUND 1    Type: Full thickness neuropathic ulceration Location: plantar lateral wound - at aspect of where 5th met head would reside.  --- Wound base: 70% Granular ; 20% Fibrosis ; 10% Necrotic  Adjacent tissue/ Wound borders: macerated/ rolled Undermining noted (Pre-debridement): yes; Direction: circumferentially with biggest area of tracking dorsally / laterally.  PTB: + yes  Malodor: no Active Drainage: no    Measurements: 2cm (l) x 2cm (w) x bone cm (2cm) (d)     Exam Status  Status  Final [99]   PACS Intelerad Image Link   Show images for CT FOOT LEFT WO CONTRAST Study Result  Narrative & Impression  CLINICAL  DATA:  Plantar foot wound status post incision and drainage on 04/25/2024. History of prior partial metatarsal amputations.   EXAM: CT OF THE LEFT FOOT WITHOUT CONTRAST   TECHNIQUE: Multidetector CT imaging of the left foot was performed according to the standard protocol. Multiplanar CT image reconstructions were also generated.   RADIATION DOSE REDUCTION: This exam was performed according to the departmental dose-optimization program which includes automated exposure control, adjustment of the mA and/or kV according to patient size and/or use of iterative reconstruction technique.   COMPARISON:  MRI of the left foot dated 04/08/2024.   FINDINGS: Soft tissue Status post incision and drainage of a soft tissue wound along the plantar lateral aspect of the forefoot, near the level of the fifth metatarsal amputation, with soft tissue defect/wound extending to the plantar margin of the mid to distal fifth metatarsal, just proximal to the transsection margin. No discrete loculated fluid collection. There is surrounding cutaneous and soft tissue thickening with edema.   Muscles and Tendons Chronic atrophy of the intrinsic foot musculature. Postoperative changes related to prior partial second ray amputation and partial amputation of the fifth metatarsal and base of the fifth proximal phalanx. No discrete loculated intramuscular fluid collection identified.   Bones/Joint/Cartilage   Postoperative changes related to partial second ray amputation at the level of the second metatarsal neck and amputation of the distal fifth metatarsal and the base of the fifth proximal phalanx. No definite acute osteolysis  identified. No acute fracture or dislocation. Unchanged chronic flattening of third metatarsal head. Moderate degenerative arthropathy of the midfoot, most pronounced at the second and third TMT joints and the navicular-cuneiform articulation. Diffuse interphalangeal joint space  narrowing. Mild osteoarthritis of the first MTP joint. Calcaneal enthesopathy at the insertion of the Achilles tendon and the origin of the central cord of the plantar fascia.   Ligaments   Ligaments are suboptimally evaluated by CT.   IMPRESSION: 1. Status post incision and drainage of a soft tissue wound along the plantar lateral aspect of the forefoot, near the level of the fifth metatarsal amputation, with soft tissue defect/wound extending to the plantar margin of the mid to distal fifth metatarsal, just proximal to the transsection margin. No definite acute osteolysis identified. No discrete loculated fluid collection. Surrounding cutaneous and soft tissue thickening and edema may reflect postsurgical change, however, cellulitis can not be excluded. 2. Postoperative changes related to partial second ray amputation at the level of the second metatarsal neck and amputation of the distal fifth metatarsal and the base of the fifth proximal phalanx. 3. Moderate degenerative arthropathy of the midfoot.     Electronically Signed   By: Harrietta Sherry M.D.   On: 04/26/2024 11:18   Results for orders placed or performed during the hospital encounter of 04/24/24  MRSA Next Gen by PCR, Nasal     Status: None   Collection Time: 04/25/24  4:30 AM   Specimen: Nasal Mucosa; Nasal Swab  Result Value Ref Range Status   MRSA by PCR Next Gen NOT DETECTED NOT DETECTED Final    Comment: (NOTE) The GeneXpert MRSA Assay (FDA approved for NASAL specimens only), is one component of a comprehensive MRSA colonization surveillance program. It is not intended to diagnose MRSA infection nor to guide or monitor treatment for MRSA infections. Test performance is not FDA approved in patients less than 74 years old. Performed at [facility], [address] [place], [place] [address]   Aerobic/Anaerobic Culture w Gram Stain (surgical/deep wound)     Status: None (Preliminary result)    Collection Time: 04/26/24  7:21 PM   Specimen: Wound  Result Value Ref Range Status   Specimen Description   Final    WOUND Performed at [facility], [address] [place], [place] [address]    Special Requests   Final     LEFT FOOT Performed at [facility], [address] [place], [place] [address]    Gram Stain   Final    RARE WBC PRESENT, PREDOMINANTLY PMN RARE GRAM POSITIVE COCCI Performed at [facility], [address], [place], [place] [address]    Culture PENDING  Incomplete   Report Status PENDING  Incomplete      ASSESSMENT:?  Joann [name] is a 78 y.o. female ?s/p I&D for left foot subcutaneous emphysema / DFI.  - CT without residual gas or abscess findings identified.  - Patient is stable.  - Would STRONGLY recommend thorough med discharge instructions - patient and husband seem to be taking medication (anticoagulants?) possibly inappropriately / too often on my review with what to take - acute medications with chronic medications.   PLAN:? - Activity: Heel touch down for transfer on the LLE in post op shoe - please dispensed another flat post op shoe if patient doesn't already have (they left the last shoe in the hospital on discharge last time because they were unclear if they could bring it home  with them).   - Diet: DM2  - Wound Care: *dressings an be changed by podiatry and HHRN - Please avoid dressings by patient currently.  - Clean area with wound care cleanser, pat dry, Packing to the LEFT wound with packing strips. Paint surrounding wound with betadine , cover with 4x4 gauze, ABD, and anchor all dressings with loosely placed kerlix/ ACE.  Materials: Betadine  / Adaptic / Gauze (4x4) / Kerlix / 4 inch ACE bandage Instructions:  Supplement Reccs: Okay to reinforce with dry gauze / loose ACE overlap prn strikethrough. Please leave underlayer intact.  - ABX: Appreciate assistance with antibiotic stewardship from medicine /  pharmacy / ID services.  - F/u wound culture (obtained 04/26/24 PM) - deep wound from plantar surgical site after removal of clean sterile dressings.  Anti-infectives (From admission, onward)    Start     Dose/Rate Route Frequency Ordered Stop   04/26/24 1400  ceFAZolin  (ANCEF ) IVPB 2g/100 mL premix        2 g 200 mL/hr over 30 Minutes Intravenous Every 8 hours 04/26/24 0843     04/26/24 0000  ceFAZolin  (ANCEF ) IVPB        2 g Intravenous Every 8 hours 04/26/24 1620 05/07/24 2359   04/24/24 1700  ceFAZolin  (ANCEF ) IVPB 1 g/50 mL premix  Status:  Discontinued        1 g 100 mL/hr over 30 Minutes Intravenous Every 12 hours 04/24/24 1614 04/24/24 1623   04/24/24 1700  ceFAZolin  (ANCEF ) IVPB 2g/100 mL premix  Status:  Discontinued        2 g 200 mL/hr over 30 Minutes Intravenous Every 12 hours 04/24/24 1623 04/26/24 0843       - Dispo: RTC home with HHRN for wound care/ IV assist. Recc follow up with clinic within 1 week for podiatry. Ok to DC from podiatry prospective.

## 2024-04-27 NOTE — Plan of Care (Signed)
  Problem: Coping: Goal: Ability to adjust to condition or change in health will improve Outcome: Progressing   Problem: Health Behavior/Discharge Planning: Goal: Ability to manage health-related needs will improve Outcome: Progressing   Problem: Metabolic: Goal: Ability to maintain appropriate glucose levels will improve Outcome: Progressing   Problem: Skin Integrity: Goal: Risk for impaired skin integrity will decrease Outcome: Progressing   Problem: Coping: Goal: Level of anxiety will decrease Outcome: Progressing

## 2024-04-27 NOTE — Evaluation (Signed)
 Occupational Therapy Evaluation Patient Details Name: Joann [name] MRN: [id number] DOB: Dec 04, 1945 Today's Date: 04/27/2024   History of Present Illness   Pt is an 78 y/o F presenting to ED from outpatient podiatry clinic with L foot wound. Pt underwent I&D of L foot on 04/25/24. MD assessment includes L foot osteomyelitis/wound failed outpatient treatment, diabetes, HTN. 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 Pt was recently d/c on 9/18 and was receiving home health services for wound care and PT/OT.     Clinical Impressions This is Joann [name]'s second hospitalization within a month 2/2 L foot wound. Pt and spouse report that they have all required DME/AE at home and are already set up with home health. Pt able to transfer chair<>BSC while adhering to [facility] precautions. Recommend to continue with DC plan in place from previous hospitalization. Provided educ re: importance of adhering to precautions to give wound best chance to heal. Pt and spouse demonstrate understanding.     If plan is discharge home, recommend the following:   A little help with walking and/or transfers;A little help with bathing/dressing/bathroom;Assist for transportation;Help with stairs or ramp for entrance;Supervision due to cognitive status     Functional Status Assessment   Patient has had a recent decline in their functional status and demonstrates the ability to make significant improvements in function in a reasonable and predictable amount of time.     Equipment Recommendations   None recommended by OT     Recommendations for Other Services         Precautions/Restrictions   Precautions Precautions: Fall Recall of Precautions/Restrictions: Intact Precaution/Restrictions Comments: per podiatry op note pt is to be NWB on LLE with post-op shoe Restrictions Weight Bearing Restrictions Per Provider Order: Yes LLE Weight Bearing Per  Provider Order: Non weight bearing     Mobility Bed Mobility               General bed mobility comments: not assessed as patient sitting up on arrival and post session.    Transfers Overall transfer level: Needs assistance Equipment used: Rolling walker (2 wheels) Transfers: Sit to/from Stand Sit to Stand: Contact guard assist     Step pivot transfers: Contact guard assist     General transfer comment: patient has off loading show in place. education on weight bearing restrictions, heel bearing for transfers only. patient is able to maintain precuations with transfers. occasional cues for safety      Balance Overall balance assessment: Needs assistance Sitting-balance support: Feet supported Sitting balance-Leahy Scale: Good     Standing balance support: Bilateral upper extremity supported Standing balance-Leahy Scale: Fair                             ADL either performed or assessed with clinical judgement   ADL Overall ADL's : Needs assistance/impaired                     Lower Body Dressing: Minimal assistance;Sitting/lateral leans Lower Body Dressing Details (indicate cue type and reason): donning R sock and L post-op shoe Toilet Transfer: Minimal assistance;BSC/3in1;Rolling walker (2 wheels)   Toileting- Clothing Manipulation and Hygiene: Minimal assistance;Sit to/from stand               Vision         Perception         Praxis  Pertinent Vitals/Pain Pain Assessment Pain Assessment: No/denies pain     Extremity/Trunk Assessment Upper Extremity Assessment Upper Extremity Assessment: Overall WFL for tasks assessed   Lower Extremity Assessment Lower Extremity Assessment: Generalized weakness       Communication Communication Communication: No apparent difficulties Factors Affecting Communication: Hearing impaired   Cognition Arousal: Alert Behavior During Therapy: WFL for tasks  assessed/performed Cognition: No apparent impairments           Executive functioning impairment (select all impairments): Problem solving                     Following commands impaired: Follows one step commands with increased time     Cueing  General Comments      spouse and patient both report they have all DME in place at home and are eager to get discharged today   Exercises Other Exercises Other Exercises: Educ to pt and spouse re: WB ing precautions, home safety, falls prevention   Shoulder Instructions      Home Living Family/patient expects to be discharged to:: Private residence Living Arrangements: Spouse/significant other Available Help at Discharge: Family;Available 24 hours/day Type of Home: House Home Access: Stairs to enter [employer] of Steps: 3 Entrance Stairs-Rails: Left Home Layout: One level;Laundry or work area in basement     [employer] Shower/Tub: Arts development officer Toilet: Handicapped height     Home Equipment: Agricultural consultant (2 wheels);Cane - single point;BSC/3in1;Grab bars - toilet;Grab bars - tub/shower   Additional Comments: pt was receiving HH PT/OT 1-2 times per week prior to admission      Prior Functioning/Environment Prior Level of Function : Needs assist             Mobility Comments: From most recent discharge, pt reports limited ambulation/mobility due to L foot wound ADLs Comments: Pt's spouse has been assisting with cooking, cleaning, and basic ADLs for pt since recent d/c    OT Problem List: Decreased strength;Decreased range of motion;Decreased activity tolerance;Impaired balance (sitting and/or standing)   OT Treatment/Interventions: Self-care/ADL training;Therapeutic exercise;Neuromuscular education;Balance training;Patient/family education;DME and/or AE instruction;Therapeutic activities      OT Goals(Current goals can be found in the care plan section)   Acute Rehab OT  Goals Patient Stated Goal: to go home ASAP OT Goal Formulation: With patient Time For Goal Achievement: 05/11/24 Potential to Achieve Goals: Good ADL Goals Pt Will Perform Lower Body Dressing: with min assist;sitting/lateral leans Pt Will Transfer to Toilet: ambulating;stand pivot transfer;bedside commode;with supervision Pt Will Perform Toileting - Clothing Manipulation and hygiene: with modified independence;sitting/lateral leans;sit to/from stand Additional ADL Goal #1: Pt will demonstrate 100% adherence to NWB precautions   OT Frequency:  Min 2X/week    Co-evaluation              AM-PAC OT 6 Clicks Daily Activity     Outcome Measure Help from another person eating meals?: None Help from another person taking care of personal grooming?: A Little Help from another person toileting, which includes using toliet, bedpan, or urinal?: A Lot Help from another person bathing (including washing, rinsing, drying)?: A Lot Help from another person to put on and taking off regular upper body clothing?: A Little Help from another person to put on and taking off regular lower body clothing?: A Lot 6 Click Score: 16   End of Session Equipment Utilized During Treatment: Rolling walker (2 wheels)  Activity Tolerance: Patient tolerated treatment well Patient left: in chair;with call bell/phone  within reach;with chair alarm set;with nursing/sitter in room;with family/visitor present  OT Visit Diagnosis: Unsteadiness on feet (R26.81);Other abnormalities of gait and mobility (R26.89);Muscle weakness (generalized) (M62.81)                Time: 1005-1020 OT Time Calculation (min): 15 min Charges:  OT Evaluation $OT Eval Low Complexity: 1 Low OT Treatments $Self Care/Home Management : 8-22 mins Suzen Hock, PhD, MS, OTR/L 04/27/24, 1:03 PM

## 2024-04-27 NOTE — Progress Notes (Signed)
 Physical Therapy Treatment Patient Details Name: Landis Cassaro MRN: [id number] DOB: 1946-01-24 Today's Date: 04/27/2024   History of Present Illness Pt is an 78 y/o F presenting to ED from outpatient podiatry clinic with L foot wound. Pt underwent I&D of L foot on 04/25/24. MD assessment includes L foot osteomyelitis/wound failed outpatient treatment, diabetes, HTN. 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 Pt was recently d/c on 9/18 and was receiving home health services for wound care and PT/OT.    PT Comments  Patient is agreeable to PT session. Patient and spouse are very eager to go home today. She has an off loading shoe in place which she reports she was wearing at home prior to this admission. Education on weight bearing restrictions and patient able to maintain with functional activity. Occasional safety cues provided. PT will continue to follow to maximize independence and decrease caregiver burden.    If plan is discharge home, recommend the following: A little help with walking and/or transfers;A little help with bathing/dressing/bathroom;Assistance with cooking/housework;Assist for transportation;Help with stairs or ramp for entrance   Can travel by private vehicle        Equipment Recommendations  Other (comment) (spouse reports patient has all DME in place at home)    Recommendations for Other Services       Precautions / Restrictions Precautions Precautions: Fall Recall of Precautions/Restrictions: Intact Required Braces or Orthoses: Other Brace Other Brace: L post-op shoe Restrictions Weight Bearing Restrictions Per Provider Order: Yes Other Position/Activity Restrictions: Touch down heel WB only for transfer on the LEFT lower extremity in post operative shoe.     Mobility  Bed Mobility               General bed mobility comments: not assessed as patient sitting up on arrival and post session.     Transfers Overall transfer level: Needs assistance Equipment used: 1 person hand held assist Transfers: Sit to/from Stand   Stand pivot transfers: Contact guard assist         General transfer comment: patient has off loading show in place. education on weight bearing restrictions, heel bearing for transfers only. patient is able to maintain precuations with transfers. occasional cues for safety    Ambulation/Gait               General Gait Details: deferred due to weight bearing restrictions   Stairs             Wheelchair Mobility     Tilt Bed    Modified Rankin (Stroke Patients Only)       Balance Overall balance assessment: Needs assistance Sitting-balance support: Feet supported Sitting balance-Leahy Scale: Good Sitting balance - Comments: patient able to complete peri hygiene without loss of balance   Standing balance support: Single extremity supported Standing balance-Leahy Scale: Fair                              Hotel manager: Impaired Factors Affecting Communication: Hearing impaired  Cognition Arousal: Alert Behavior During Therapy: WFL for tasks assessed/performed   PT - Cognitive impairments: No apparent impairments                         Following commands: Impaired Following commands impaired: Follows one step commands with increased time    Cueing    Exercises  General Comments General comments (skin integrity, edema, etc.): spouse and patient both report they have all DME in place at home and are eager to get discharged today      Pertinent Vitals/Pain Pain Assessment Pain Assessment: No/denies pain    Home Living                          Prior Function            PT Goals (current goals can now be found in the care plan section) Acute Rehab PT Goals Patient Stated Goal: to get better and stronger PT Goal Formulation: With patient Time For Goal  Achievement: 05/10/24 Potential to Achieve Goals: Fair Progress towards PT goals: Progressing toward goals    Frequency    Min 3X/week      PT Plan      Co-evaluation              AM-PAC PT 6 Clicks Mobility   Outcome Measure  Help needed turning from your back to your side while in a flat bed without using bedrails?: None Help needed moving from lying on your back to sitting on the side of a flat bed without using bedrails?: A Little Help needed moving to and from a bed to a chair (including a wheelchair)?: A Little Help needed standing up from a chair using your arms (e.g., wheelchair or bedside chair)?: A Little Help needed to walk in hospital room?: A Lot Help needed climbing 3-5 steps with a railing? : A Lot 6 Click Score: 17    End of Session   Activity Tolerance: Patient tolerated treatment well Patient left: in chair;with call bell/phone within reach;with family/visitor present   PT Visit Diagnosis: Unsteadiness on feet (R26.81);Muscle weakness (generalized) (M62.81);Difficulty in walking, not elsewhere classified (R26.2)     Time: 8850-8796 PT Time Calculation (min) (ACUTE ONLY): 14 min  Charges:    $Therapeutic Activity: 8-22 mins PT General Charges $$ ACUTE PT VISIT: 1 Visit                     Randine Essex, PT, MPT    Randine LULLA Essex 04/27/2024, 12:51 PM

## 2024-04-30 ENCOUNTER — Ambulatory Visit: Payer: Self-pay

## 2024-05-01 LAB — AEROBIC/ANAEROBIC CULTURE W GRAM STAIN (SURGICAL/DEEP WOUND)

## 2024-05-03 ENCOUNTER — Other Ambulatory Visit
Admission: RE | Admit: 2024-05-03 | Discharge: 2024-05-03 | Disposition: A | Discharge status: Home / Self Care | Source: Ambulatory Visit | Attending: Infectious Diseases | Admitting: Infectious Diseases

## 2024-05-03 ENCOUNTER — Ambulatory Visit: Attending: Infectious Diseases | Admitting: Infectious Diseases

## 2024-05-03 ENCOUNTER — Encounter: Payer: Self-pay | Admitting: Infectious Diseases

## 2024-05-03 DIAGNOSIS — Z79899 Other long term (current) drug therapy: Secondary | ICD-10-CM | POA: Diagnosis not present

## 2024-05-03 DIAGNOSIS — B9620 Unspecified Escherichia coli [E. coli] as the cause of diseases classified elsewhere: Secondary | ICD-10-CM | POA: Diagnosis not present

## 2024-05-03 DIAGNOSIS — E1122 Type 2 diabetes mellitus with diabetic chronic kidney disease: Secondary | ICD-10-CM | POA: Diagnosis not present

## 2024-05-03 DIAGNOSIS — N189 Chronic kidney disease, unspecified: Secondary | ICD-10-CM | POA: Diagnosis not present

## 2024-05-03 DIAGNOSIS — Z48817 Encounter for surgical aftercare following surgery on the skin and subcutaneous tissue: Secondary | ICD-10-CM | POA: Insufficient documentation

## 2024-05-03 DIAGNOSIS — I129 Hypertensive chronic kidney disease with stage 1 through stage 4 chronic kidney disease, or unspecified chronic kidney disease: Secondary | ICD-10-CM | POA: Diagnosis not present

## 2024-05-03 DIAGNOSIS — L02612 Cutaneous abscess of left foot: Secondary | ICD-10-CM | POA: Diagnosis not present

## 2024-05-03 DIAGNOSIS — Z7902 Long term (current) use of antithrombotics/antiplatelets: Secondary | ICD-10-CM | POA: Insufficient documentation

## 2024-05-03 DIAGNOSIS — B964 Proteus (mirabilis) (morganii) as the cause of diseases classified elsewhere: Secondary | ICD-10-CM | POA: Diagnosis not present

## 2024-05-03 DIAGNOSIS — L089 Local infection of the skin and subcutaneous tissue, unspecified: Secondary | ICD-10-CM | POA: Insufficient documentation

## 2024-05-03 DIAGNOSIS — E11628 Type 2 diabetes mellitus with other skin complications: Secondary | ICD-10-CM

## 2024-05-03 DIAGNOSIS — R7881 Bacteremia: Secondary | ICD-10-CM | POA: Diagnosis not present

## 2024-05-03 DIAGNOSIS — B9561 Methicillin susceptible Staphylococcus aureus infection as the cause of diseases classified elsewhere: Secondary | ICD-10-CM | POA: Diagnosis not present

## 2024-05-03 DIAGNOSIS — Z7984 Long term (current) use of oral hypoglycemic drugs: Secondary | ICD-10-CM | POA: Diagnosis not present

## 2024-05-03 LAB — COMPREHENSIVE METABOLIC PANEL WITH GFR
ALT: 7 U/L (ref 0–44)
AST: 53 U/L — ABNORMAL HIGH (ref 15–41)
Albumin: 3.1 g/dL — ABNORMAL LOW (ref 3.5–5.0)
Alkaline Phosphatase: 118 U/L (ref 38–126)
Anion gap: 12 (ref 5–15)
BUN: 18 mg/dL (ref 8–23)
CO2: 22 mmol/L (ref 22–32)
Calcium: 9.2 mg/dL (ref 8.9–10.3)
Chloride: 104 mmol/L (ref 98–111)
Creatinine, Ser: 0.99 mg/dL (ref 0.44–1.00)
GFR, Estimated: 58 mL/min — ABNORMAL LOW (ref >60)
Glucose, Bld: 90 mg/dL (ref 70–99)
Potassium: 3.6 mmol/L (ref 3.5–5.1)
Sodium: 138 mmol/L (ref 135–145)
Total Bilirubin: 0.8 mg/dL (ref 0.0–1.2)
Total Protein: 7.4 g/dL (ref 6.5–8.1)

## 2024-05-03 LAB — CBC WITH DIFFERENTIAL/PLATELET
Abs Immature Granulocytes: 0.01 K/uL (ref 0.00–0.07)
Basophils Absolute: 0.1 K/uL (ref 0.0–0.1)
Basophils Relative: 1 %
Eosinophils Absolute: 0.2 K/uL (ref 0.0–0.5)
Eosinophils Relative: 5 %
HCT: 36.5 % (ref 36.0–46.0)
Hemoglobin: 11.8 g/dL — ABNORMAL LOW (ref 12.0–15.0)
Immature Granulocytes: 0 %
Lymphocytes Relative: 29 %
Lymphs Abs: 1.3 K/uL (ref 0.7–4.0)
MCH: 29.5 pg (ref 26.0–34.0)
MCHC: 32.3 g/dL (ref 30.0–36.0)
MCV: 91.3 fL (ref 80.0–100.0)
Monocytes Absolute: 0.4 K/uL (ref 0.1–1.0)
Monocytes Relative: 10 %
Neutro Abs: 2.5 K/uL (ref 1.7–7.7)
Neutrophils Relative %: 55 %
Platelets: 167 K/uL (ref 150–400)
RBC: 4 MIL/uL (ref 3.87–5.11)
RDW: 15.2 % (ref 11.5–15.5)
WBC: 4.6 K/uL (ref 4.0–10.5)
nRBC: 0 % (ref 0.0–0.2)

## 2024-05-03 LAB — SEDIMENTATION RATE: Sed Rate: 33 mm/h — ABNORMAL HIGH (ref 0–30)

## 2024-05-03 LAB — C-REACTIVE PROTEIN: CRP: 0.6 mg/dL (ref <1.0)

## 2024-05-03 NOTE — Patient Instructions (Signed)
 Your foot is looking great today You have 5 more days of Iv cefazolin - will remove PICc after that. And will get labs today and decide whether you need any Po antibiotic or nor.

## 2024-05-03 NOTE — Progress Notes (Unsigned)
 NAME: Joann [name]  DOB: Feb 12, 1946  MRN: [id number]  Date/Time: 05/03/2024 10:34 AM  Subjective:  Follow up after recent hospitalization for left foot infection  9/30-10/3 9/14-9/18 ? Joann [name] is a 78 y.o. female with a history of DM, MSSA bacteremia , left foot abscess I/D  MSSA and Ecoli was sent home on 4 weeks of IV cefazolin  to complete on 05/07/24 She was readmitted for possible wash out and clearing which she had on 04/25/24- bon was healthy and no purulence noted and no culture sent She is doing well Husband is here with he does the IV antibiotic   Past Medical History:  Diagnosis Date   Breast cancer (HCC) 2007   right breast lumpectomy with 36 rad tx   Breast mass 11/23/2016   6 o'clock   Diabetes mellitus without complication (HCC)    Hypertension     Past Surgical History:  Procedure Laterality Date   AMPUTATION Left 02/23/2019   Procedure: AMPUTATION LEFT 2nd Toe;  Surgeon: Ashley Soulier, DPM;  Location: [facility];  Service: Podiatry;  Laterality: Left;   AMPUTATION TOE Right 10/26/2020   Procedure: right great toe amputation;  Surgeon: Ashley Soulier, DPM;  Location: [facility];  Service: Podiatry;  Laterality: Right;   BONE EXCISION Left 05/02/2020   Procedure: BONE EXCISION;  Surgeon: Ashley Soulier, DPM;  Location: [facility];  Service: Podiatry;  Laterality: Left;   BREAST BIOPSY Right 2007   positive.  Lumpectomy with rad tx   BREAST LUMPECTOMY Right 2007   [facility] APPLICATION Left 05/02/2020   Procedure: ADJACENT SKIN TRANSFER;  Surgeon: Ashley Soulier, DPM;  Location: [facility];  Service: Podiatry;  Laterality: Left;   INCISION AND DRAINAGE Left 10/16/2016   Procedure: INCISION AND DRAINAGE;  Surgeon: Donnice Cory, DPM;  Location: [facility];  Service: Podiatry;  Laterality: Left;   INCISION AND DRAINAGE OF WOUND Left 04/09/2024   Procedure: IRRIGATION AND DEBRIDEMENT WOUND;  Surgeon: Tanda Greig MATSU, DPM;  Location: [facility];  Service:  Podiatry;  Laterality: Left;  04/08/24 Can do from anywhere from 12:20 to 1pm or anytime 4:30 onward.   IRRIGATION AND DEBRIDEMENT FOOT Left 03/02/2019   Procedure: IRRIGATION AND DEBRIDEMENT LEFT FOOT SECOND METATARSAL, DIABETIC;  Surgeon: Ashley Soulier, DPM;  Location: [facility];  Service: Podiatry;  Laterality: Left;   IRRIGATION AND DEBRIDEMENT FOOT Left 04/25/2024   Procedure: IRRIGATION AND DEBRIDEMENT FOOT;  Surgeon: Tanda Greig MATSU, DPM;  Location: [facility];  Service: Podiatry;  Laterality: Left;   METATARSAL HEAD EXCISION Left 05/02/2020   Procedure: METATARSAL HEAD EXCISION 5th;  Surgeon: Ashley Soulier, DPM;  Location: [facility];  Service: Podiatry;  Laterality: Left;    Social History   Socioeconomic History   Marital status: Married    Spouse name: Not on file   Number of children: Not on file   Years of education: Not on file   Highest education level: Not on file  Occupational History   Not on file  Tobacco Use   Smoking status: Never   Smokeless tobacco: Never  Vaping Use   Vaping status: Never Used  Substance and Sexual Activity   Alcohol use: No   Drug use: No   Sexual activity: Not on file  Other Topics Concern   Not on file  Social History Narrative   Not on file   Social Drivers of Health   Financial Resource Strain: Low Risk  (03/07/2024)   Received from [facility]   Overall Financial  Resource Strain (CARDIA)    Difficulty of Paying Living Expenses: Not hard at all  Food Insecurity: No Food Insecurity (04/24/2024)   Hunger Vital Sign    Worried About Running Out of Food in the Last Year: Never true    Ran Out of Food in the Last Year: Never true  Transportation Needs: No Transportation Needs (04/24/2024)   PRAPARE - Administrator, Civil Service (Medical): No    Lack of Transportation (Non-Medical): No  Physical Activity: Not on file  Stress: Not on file  Social Connections: Moderately Integrated (04/24/2024)   Social Connection  and Isolation Panel    Frequency of Communication with Friends and Family: More than three times a week    Frequency of Social Gatherings with Friends and Family: Never    Attends Religious Services: More than 4 times per year    Active Member of [employer] or Organizations: No    Attends Banker Meetings: Never    Marital Status: Married  Catering manager Violence: Not At Risk (04/24/2024)   Humiliation, Afraid, Rape, and Kick questionnaire    Fear of Current or Ex-Partner: No    Emotionally Abused: No    Physically Abused: No    Sexually Abused: No    Family History  Problem Relation Age of Onset   Dementia Mother    CAD Mother    CAD Father    Deep vein thrombosis Father    Breast cancer Neg Hx    Allergies  Allergen Reactions   Neosporin [Neomycin-Bacitracin Zn-Polymyx] Itching   Penicillins Itching    Has patient had a PCN reaction causing immediate rash, facial/tongue/throat swelling, SOB or lightheadedness with hypotension: no Has patient had a PCN reaction causing severe rash involving mucus membranes or skin necrosis: no Has patient had a PCN reaction that required hospitalization no Has patient had a PCN reaction occurring within the last 10 years:no If all of the above answers are NO, then may proceed with Cephalosporin use.    Statins     hallucinations   I? Current Outpatient Medications  Medication Sig Dispense Refill   ascorbic acid  (VITAMIN C ) 500 MG tablet Take 1 tablet (500 mg total) by mouth 2 (two) times daily. 30 tablet 0   Calcium  Citrate-Vitamin D (CALCIUM  + D PO) Take 1 tablet by mouth daily.     ceFAZolin  (ANCEF ) IVPB Inject 2 g into the vein every 8 (eight) hours for 11 days. Indication:  MSSA bacteremia and foot infection First Dose: Yes Last Day of Therapy:  05/07/2024 Labs - Once weekly:  CBC/D, CMP, ESR and CRP Fax weekly lab results  promptly to [phone] Method of administration: IV Push Method of administration may be changed  at the discretion of home infusion pharmacist based upon assessment of the patient and/or caregiver's ability to self-administer the medication ordered. Please pull PIC at completion of IV antibiotics Call [phone] with any questions or critical value 33 Units 0   clopidogrel  (PLAVIX ) 75 MG tablet Take 1 tablet (75 mg total) by mouth daily. 30 tablet 2   feeding supplement (ENSURE PLUS HIGH PROTEIN) LIQD Take 237 mLs by mouth 3 (three) times daily between meals. 237 mL 0   hydrochlorothiazide  (HYDRODIURIL ) 12.5 MG tablet Take 12.5 mg by mouth daily.     metFORMIN  (GLUCOPHAGE ) 500 MG tablet Take 500 mg by mouth 2 (two) times daily.  1   Multiple Vitamin (MULTIVITAMIN WITH MINERALS) TABS tablet Take 1 tablet by mouth daily. 30  tablet 1   omega-3 acid ethyl esters (LOVAZA ) 1 g capsule Take 1 capsule (1 g total) by mouth daily. 30 capsule 0   zinc  sulfate, 50mg  elemental zinc , 220 (50 Zn) MG capsule Take 1 capsule (220 mg total) by mouth daily. 30 capsule 0   No current facility-administered medications for this visit.     Abtx:  Anti-infectives (From admission, onward)    None       REVIEW OF SYSTEMS:  Const: negative fever, negative chills, negative weight loss Eyes: negative diplopia or visual changes, negative eye pain ENT: negative coryza, negative sore throat Resp: negative cough, hemoptysis, dyspnea Cards: negative for chest pain, palpitations, lower extremity edema GU: negative for frequency, dysuria and hematuria GI: Negative for abdominal pain, diarrhea, bleeding, constipation Skin: negative for rash and pruritus Heme: negative for easy bruising and gum/nose bleeding MS: in wheel chair ot weight bearing Neurolo:negative for headaches, dizziness, vertigo, memory problems  Psych: negative for feelings of anxiety, depression  Endocrine: negative for thyroid, diabetes Allergy/Immunology- PCN Objective:  VITALS:  There were no vitals taken for this visit.   LDA Foley Central line Other drainage tubes PHYSICAL EXAM:  General: Alert, cooperative, no distress, appears stated age.  Head: Normocephalic, without obvious abnormality, atraumatic. Eyes: Conjunctivae clear, anicteric sclerae. Pupils are equal ENT Nares normal. No drainage or sinus tenderness. Lips, mucosa, and tongue normal. No Thrush Neck: Supple, symmetrical, no adenopathy, thyroid: non tender no carotid bruit and no JVD. Back: No CVA tenderness. Lungs: Clear to auscultation bilaterally. No Wheezing or Rhonchi. No rales. Heart: Regular rate and rhythm, no murmur, rub or gallop. Abdomen: Soft, non-tender,not distended. Bowel sounds normal. No masses Extremities: left foot Surgical wound is doing very well     Skin: No rashes or lesions. Or bruising Lymph: Cervical, supraclavicular normal. Neurologic: Grossly non-focal Pertinent Labs Lab Results CBC    Component Value Date/Time   WBC 4.7 04/26/2024 0434   RBC 3.17 (L) 04/26/2024 0434   HGB 9.5 (L) 04/27/2024 0501   HGB 14.9 10/02/2013 1336   HCT 28.8 (L) 04/26/2024 0434   HCT 44.0 10/02/2013 1336   PLT 184 04/26/2024 0434   PLT 316 10/02/2013 1336   MCV 90.9 04/26/2024 0434   MCV 88 10/02/2013 1336   MCH 29.7 04/26/2024 0434   MCHC 32.6 04/26/2024 0434   RDW 15.5 04/26/2024 0434   RDW 13.7 10/02/2013 1336   LYMPHSABS 1.4 04/24/2024 1147   MONOABS 0.7 04/24/2024 1147   EOSABS 0.2 04/24/2024 1147   BASOSABS 0.1 04/24/2024 1147       Latest Ref Rng & Units 04/26/2024    4:34 AM 04/25/2024    6:37 AM 04/24/2024    5:30 PM  CMP  Glucose 70 - 99 mg/dL 80  93    BUN 8 - 23 mg/dL 19  24    Creatinine 9.55 - 1.00 mg/dL 8.80  8.73  8.58   Sodium 135 - 145 mmol/L 135  137    Potassium 3.5 - 5.1 mmol/L 4.2  4.3    Chloride 98 - 111 mmol/L 105  105    CO2 22 - 32 mmol/L 24  26    Calcium  8.9 - 10.3 mg/dL 8.5  8.7    Total Protein 6.5 - 8.1 g/dL  6.0    Total Bilirubin 0.0 - 1.2 mg/dL  0.9    Alkaline Phos 38 -  126 U/L  106    AST 15 - 41 U/L  44  ALT 0 - 44 U/L  9        Microbiology: Recent Results (from the past 240 hours)  MRSA Next Gen by PCR, Nasal     Status: None   Collection Time: 04/25/24  4:30 AM   Specimen: Nasal Mucosa; Nasal Swab  Result Value Ref Range Status   MRSA by PCR Next Gen NOT DETECTED NOT DETECTED Final    Comment: (NOTE) The GeneXpert MRSA Assay (FDA approved for NASAL specimens only), is one component of a comprehensive MRSA colonization surveillance program. It is not intended to diagnose MRSA infection nor to guide or monitor treatment for MRSA infections. Test performance is not FDA approved in patients less than 98 years old. Performed at [facility], [address], [place], [place] [address]   Aerobic/Anaerobic Culture w Gram Stain (surgical/deep wound)     Status: None   Collection Time: 04/26/24  7:21 PM   Specimen: Wound  Result Value Ref Range Status   Specimen Description   Final    WOUND Performed at [facility], [address] [place], [place] [address]    Special Requests   Final     LEFT FOOT Performed at [facility], [address] [place], [place] [address]    Gram Stain   Final    RARE WBC PRESENT, PREDOMINANTLY PMN RARE GRAM POSITIVE COCCI    Culture   Final    FEW STAPHYLOCOCCUS HAEMOLYTICUS RARE CANDIDA PARAPSILOSIS NO ANAEROBES ISOLATED Performed at [facility], [address], [place], [place] [address]    Report Status 05/01/2024 FINAL  Final   Organism ID, Bacteria STAPHYLOCOCCUS HAEMOLYTICUS  Final      Susceptibility   Staphylococcus haemolyticus - MIC*    CIPROFLOXACIN >=8 RESISTANT Resistant     ERYTHROMYCIN >=8 RESISTANT Resistant     GENTAMICIN >=16 RESISTANT Resistant     OXACILLIN >=4 RESISTANT Resistant     TETRACYCLINE 2 SENSITIVE Sensitive     VANCOMYCIN  1 SENSITIVE Sensitive     TRIMETH/SULFA >=320 RESISTANT Resistant     CLINDAMYCIN  >=8 RESISTANT Resistant      RIFAMPIN >=32 RESISTANT Resistant     Inducible Clindamycin  NEGATIVE Sensitive     * FEW STAPHYLOCOCCUS HAEMOLYTICUS   PICC line site fine ? Impression/Recommendation ?MSSA bacteremia 2 d echo no vegetations Cardiology considered it a good study and did not want TEE Completing 4 weeks of Iv cefazolin  o 05/07/24 PICC can be removed after that  Diabetic foot infection with abscess s.p I/D MSSA and Proteus On Iv cefazolin  The recent culture done at bed side on 10/2 had candida parapsilosis and coag neg staph- contaminants will not treat  Doing very well After 10/13 PICc can be removed ? DM CKD  Htn on hydrochlorothiazide  Discussed with patient and her husband ?Note:  This document was prepared using Dragon voice recognition software and may include unintentional dictation errors.

## 2024-05-04 ENCOUNTER — Ambulatory Visit: Payer: Self-pay

## 2024-05-04 MED ORDER — CEFADROXIL 500 MG PO CAPS
500.0000 mg | ORAL_CAPSULE | Freq: Two times a day (BID) | ORAL | 0 refills | Status: AC
Start: 2024-05-04 — End: 2024-05-11
